# Patient Record
Sex: Male | Born: 1965 | Race: White | Hispanic: No | Marital: Married | Smoking: Never smoker
Health system: Southern US, Community
[De-identification: ages and names within clinical notes are randomized; demographics above are authoritative.]

## PROBLEM LIST (undated history)

## (undated) DIAGNOSIS — E785 Hyperlipidemia, unspecified: Secondary | ICD-10-CM

## (undated) DIAGNOSIS — I10 Essential (primary) hypertension: Secondary | ICD-10-CM

## (undated) DIAGNOSIS — T7840XA Allergy, unspecified, initial encounter: Secondary | ICD-10-CM

## (undated) DIAGNOSIS — E7800 Pure hypercholesterolemia, unspecified: Secondary | ICD-10-CM

## (undated) DIAGNOSIS — K5792 Diverticulitis of intestine, part unspecified, without perforation or abscess without bleeding: Secondary | ICD-10-CM

## (undated) DIAGNOSIS — D869 Sarcoidosis, unspecified: Secondary | ICD-10-CM

## (undated) DIAGNOSIS — I639 Cerebral infarction, unspecified: Secondary | ICD-10-CM

## (undated) HISTORY — PX: COLON SURGERY: SHX602

## (undated) HISTORY — DX: Cerebral infarction, unspecified: I63.9

## (undated) HISTORY — DX: Diverticulitis of intestine, part unspecified, without perforation or abscess without bleeding: K57.92

## (undated) HISTORY — DX: Allergy, unspecified, initial encounter: T78.40XA

## (undated) HISTORY — DX: Hyperlipidemia, unspecified: E78.5

## (undated) HISTORY — DX: Sarcoidosis, unspecified: D86.9

## (undated) HISTORY — DX: Essential (primary) hypertension: I10

---

## 1999-02-17 ENCOUNTER — Emergency Department (HOSPITAL_COMMUNITY): Admission: EM | Admit: 1999-02-17 | Discharge: 1999-02-17 | Payer: Self-pay | Admitting: Emergency Medicine

## 2002-03-26 ENCOUNTER — Encounter: Admission: RE | Admit: 2002-03-26 | Discharge: 2002-03-26 | Payer: Self-pay | Admitting: Rheumatology

## 2002-03-26 ENCOUNTER — Encounter: Payer: Self-pay | Admitting: Rheumatology

## 2006-10-10 HISTORY — PX: OTHER SURGICAL HISTORY: SHX169

## 2006-11-13 ENCOUNTER — Emergency Department (HOSPITAL_COMMUNITY): Admission: EM | Admit: 2006-11-13 | Discharge: 2006-11-13 | Payer: Self-pay | Admitting: Emergency Medicine

## 2006-11-14 ENCOUNTER — Emergency Department (HOSPITAL_COMMUNITY): Admission: EM | Admit: 2006-11-14 | Discharge: 2006-11-14 | Payer: Self-pay | Admitting: Emergency Medicine

## 2006-12-03 ENCOUNTER — Inpatient Hospital Stay (HOSPITAL_COMMUNITY): Admission: AD | Admit: 2006-12-03 | Discharge: 2006-12-07 | Payer: Self-pay | Admitting: Neurology

## 2006-12-03 ENCOUNTER — Encounter: Payer: Self-pay | Admitting: Emergency Medicine

## 2006-12-05 ENCOUNTER — Encounter (INDEPENDENT_AMBULATORY_CARE_PROVIDER_SITE_OTHER): Payer: Self-pay | Admitting: Cardiology

## 2007-02-16 ENCOUNTER — Encounter: Payer: Self-pay | Admitting: Neurology

## 2007-03-06 ENCOUNTER — Inpatient Hospital Stay (HOSPITAL_COMMUNITY): Admission: AD | Admit: 2007-03-06 | Discharge: 2007-03-07 | Payer: Self-pay | Admitting: Interventional Radiology

## 2007-03-21 ENCOUNTER — Encounter: Payer: Self-pay | Admitting: Interventional Radiology

## 2007-05-20 ENCOUNTER — Emergency Department (HOSPITAL_COMMUNITY): Admission: EM | Admit: 2007-05-20 | Discharge: 2007-05-20 | Payer: Self-pay | Admitting: Emergency Medicine

## 2007-06-15 ENCOUNTER — Ambulatory Visit (HOSPITAL_COMMUNITY): Admission: RE | Admit: 2007-06-15 | Discharge: 2007-06-15 | Payer: Self-pay | Admitting: Interventional Radiology

## 2007-09-14 ENCOUNTER — Ambulatory Visit (HOSPITAL_COMMUNITY): Admission: RE | Admit: 2007-09-14 | Discharge: 2007-09-14 | Payer: Self-pay | Admitting: Interventional Radiology

## 2008-03-14 ENCOUNTER — Ambulatory Visit (HOSPITAL_COMMUNITY): Admission: RE | Admit: 2008-03-14 | Discharge: 2008-03-14 | Payer: Self-pay | Admitting: Interventional Radiology

## 2009-01-21 ENCOUNTER — Encounter: Admission: RE | Admit: 2009-01-21 | Discharge: 2009-01-21 | Payer: Self-pay | Admitting: Internal Medicine

## 2010-10-31 ENCOUNTER — Encounter: Payer: Self-pay | Admitting: Internal Medicine

## 2011-02-22 NOTE — Discharge Summary (Signed)
[name] [name], DEPAULO NO.:  [id number]   MEDICAL RECORD NO.:  [id number]          PATIENT TYPE:  INP   LOCATION:  3112                         FACILITY:  [facility]   PHYSICIAN:  Sanjeev K. 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   HISTORY OF PRESENT ILLNESS:  This is a very pleasant 45 year old male  referred to Dr. Corliss Skains through the courtesy of Dr. Pearlean Brownie.  The  patient was admitted to [facility] on December 03, 2006 by Dr.  Lesia Sago for evaluation of vertigo, blurred vision, nausea and  headache.  The patient had experienced a head injury while at work  approximately three weeks prior to the admission.  He bumped his head on  the metal frame of a car.  An MRI was performed that showed evidence of  a mesial temporal infarct with a diminutive left vertebral artery that  was absent distally with a question of dissection.  The patient did have  a cerebral angiogram performed by Dr. Corliss Skains on December 04, 2006  that showed an approximate 6 mm saccular outpouching projecting  anteriorly from the dominant right vertebral artery just above the level  of the posterior arch of C1 associated with a focal area of stenosis  proximal to this.  The patient was also noted to have a focal area of  narrowing of approximately 50% in the proximal right posterior internal  carotid artery.  These findings were highly suspicious for a focal  dissection with a secondary pseudoaneurysm formation.  The patient was  noted to have a prominent filling defect of the left P2, of the left PCA  which was non-flow limiting.  This was felt to represent a thrombus.  He  had a nondominant left vertebral artery terminating in the ipsilateral  PICA.   The patient was seen in consultation  by Dr. Corliss Skains on Feb 16, 2007 at  which time arrangements were made to have the patient return to [facility] on Mar 06, 2007 to undergo a repeat cerebral angiogram  with possible stent placement of the right vertebral artery if felt to  be safe and indicated.   Past medical history is significant for hypertension.  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  He has a history of elevated lipids and  mild obesity.  He had a history of head trauma in February 2008.   ALLERGIES:  The patient is allergic to CODEINE.   MEDICATIONS ON ADMISSION:  Benicar, aspirin, Plavix and Lipitor.  The  Plavix was started in anticipation of the stent placement.  The patient  had previously been on Coumadin following his CVA.  This was  discontinued at some point.   SOCIAL HISTORY:  The patient is married.  He lives in [place].  He  has one daughter.  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  He does not smoke.  He drinks alcohol occasionally.   FAMILY HISTORY:  His mother is alive with hypertension.  His father is  alive with hypertension.   HOSPITAL COURSE:  As noted, this patient was admitted to [facility] on Mar 06, 2007 to undergo repeat cerebral angiogram and  possible PTA stenting of the right vertebral artery.  The stenting was  performed on the day of admission by Dr. Corliss Skains without immediate or  known complications.  Please see his dictated report for complete  details.   Following the procedure, the patient was admitted to the neuro intensive  care unit where he remained on intravenous heparin overnight.  The  patient remained stable.  He was seen the following day and was doing  well.  The right femoral groin sheath was removed after which the  patient will remain on bedrest for at least 6 hours.  If he is stable at  the end of his bedrest, he will no doubt be discharged  to home.   LABORATORY DATA:  On the day of discharge BUN was 11, creatinine 0.99,  potassium is 3.9, glucose was 93.  His GFR was greater than 60.  A CBC  on the day of discharge revealed hemoglobin 13.2, hematocrit 39.1, WBCs  10,000, platelets 294,000.   DISCHARGE MEDICATIONS:  The patient was told to continue the medications  he was taking prior to admission.  He is to remain on Plavix until  stopped by Dr. Corliss Skains.  He is to continue his regular home  medications as well as aspirin 81 mg daily.   The patient was given instructions regarding wound care.  He was told to  stay on his regular diet.  He was to follow-up with Dr. Corliss Skains in  approximately two weeks.  He was not to return to work or do anything  strenuous for at least two weeks.   PROBLEM LIST AT TIME OF DISCHARGE:  1. Status post left cerebrovascular accident.  2. History of head injury.  3. 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  4. Hypertension history.  5. Hyperlipidemia.  6. History of 2-D echocardiogram performed in February 2008 revealing      an ejection fraction of 60-70% with a question of a patent foramen      ovale.  7. History of allergy to codeine.      Delton See, P.A.    ______________________________  Grandville Silos. Corliss Skains, M.D.    DR/MEDQ  D:  03/07/2007  T:  03/07/2007  Job:  [id number]   cc:   Pramod P. Pearlean Brownie, MD  Geoffry Paradise, M.D.

## 2011-02-22 NOTE — Consult Note (Signed)
[name] [name], AYALA NO.:  [id number]   MEDICAL RECORD NO.:  [id number]           PATIENT TYPE:   LOCATION:                                 FACILITY:   PHYSICIAN:  Sanjeev K. Deveshwar, M.D.DATE OF BIRTH:  1966-04-07   DATE OF CONSULTATION:  03/21/2007  DATE OF DISCHARGE:                                 CONSULTATION   CHIEF COMPLAINT:  Status post stenting of the right vertebral artery  secondary to a right vertebral artery dissection.   HISTORY OF PRESENT ILLNESS:  This is a pleasant 45 year old male  referred to Dr. Corliss Skains through the courtesy of Dr. Pearlean Brownie, who was  admitted to [facility] on December 03, 2006 by Dr. Marlan Palau with vertigo, blurred vision, nausea, and headache.  The patient  had experienced a head injury approximately 3 weeks prior to this.  An  MRI at the time of his admission showed evidence of a left CVA.  A  cerebral angiogram was performed by Dr. Corliss Skains on December 04, 2006  that showed a 6 mm saccular outpouching projecting anteriorly from the  dominant right vertebral artery just above the level of the posterior  arch of C1, associated with a focal area of stenosis proximal to this.  This was highly suspicious of a focal dissection with a secondary  pseudoaneurysm formation.  The patient was seen in consultation by Dr.  Corliss Skains on Feb 16, 2007, and arrangements were made to have the patient  admitted to [facility] Mar 06, 2007 for treatment of the  pseudoaneurysm. During that visit, a stent was placed in the right  vertebral artery, with excellent results.  The patient returns today  accompanied by his wife and a case manager to be seen in followup.   PAST MEDICAL HISTORY:  1. Hypertension.  2. The above-noted left temporal CVA.  3. History of elevated lipids.  4. History of mild obesity.  5. History of head trauma.   ALLERGIES:  CODEINE.   MEDICATIONS:  Benicar, aspirin, Plavix, and Lipitor.   SOCIAL HISTORY:  The patient is married.  He lives in [place].  He  has one daughter.  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  He does not smoke.  He drinks alcohol occasionally.   FAMILY HISTORY:  His mother is alive and has hypertension.  His father  is alive and has hypertension.   IMPRESSION AND PLAN:  As noted, the patient returns today to be seen in  follow-up after a stent was placed in his right vertebral artery for a  dissection associated with a pseudoaneurysm on Mar 06, 2007 by Dr.  Corliss Skains. The patient had suffered a left CVA, with minimal residual  deficits.  Today, he reports that he has had some occasional mild  headaches which he attributes to the heat but has otherwise been  asymptomatic.  The patient's wife is concerned about some memory  problems he has been experiencing.  Dr. Corliss Skains feels that this may be  secondary to his stroke.   The patient continues  on aspirin and Plavix.  He has had some problems  with bruising due to the Plavix.  Dr. Corliss Skains recommended stopping the  Plavix at this time, which was the original plan.  He recommended  continuing aspirin 81 mg once daily for four more weeks.  Then, he is to  switch to 81 mg every other day.   The patient was told that he can return to work on April 10, 2007.  He was  not to lift more than 50 pounds.  He was to avoid any sudden movements  of his neck.   A repeat cerebral angiogram was recommended in 3 months.  If the  pseudoaneurysm appears to be healed at this time, no further followup  will be recommended.  The patient does not smoke. However, his wife  smokes.  The patient was advised to avoid any secondhand smoke.   Dr. Corliss Skains reviewed the results of the angiogram and pointed out the  area that was stented.  All of their questions were answered.  Greater  than 30 minutes was spent on this consult.      Delton See, P.A.    ______________________________   Grandville Silos. Corliss Skains, M.D.    DR/MEDQ  D:  03/21/2007  T:  03/22/2007  Job:  [id number]   cc:   Delia Heady, M.D.  Geoffry Paradise, M.D.

## 2011-02-22 NOTE — Consult Note (Signed)
[name], [name] NO.:  [id number]   MEDICAL RECORD NO.:  [id number]          PATIENT TYPE:  OUT   LOCATION:  MRI                          FACILITY:  [facility]   PHYSICIAN:  Sanjeev K. Deveshwar, M.D.DATE OF BIRTH:  Feb 25, 1966   DATE OF CONSULTATION:  09/14/2007  DATE OF DISCHARGE:                                 CONSULTATION   DATE OF CONSULTATION:  September 14, 2007.   CHIEF COMPLAINT:  Status post stenting of the right vertebral artery  secondary to a right vertebral artery dissection performed on Mar 06, 2007.   BRIEF HISTORY:  This is a pleasant 45 year old male referred to Dr.  Corliss Skains through the courtesy of Dr. Pearlean Brownie.  The patient was admitted  to [facility] December 03, 2006 by Dr. Lesia Sago with a left  CVA.  The patient had experienced a head injury at work approximately 3  weeks prior to his admission.  A cerebral angiogram was performed by Dr.  Corliss Skains on December 04, 2006 that showed a 6 mm saccular outpouching  projecting anteriorly from the dominant right vertebral artery just  above the level of the posterior arch of C1 associated with a focal area  of stenosis proximal to this.  This was consistent with a focal  dissection with a secondary pseudoaneurysm formation.  On Mar 06, 2007,  a stent was placed in the right vertebral artery by Dr. Corliss Skains with  excellent results.  The patient returns today for a follow up MRI and a  followup visit.   PAST MEDICAL HISTORY:  1. Significant for hypertension.  2. The above noted left temporal CVA.  3. He has a history of elevated lipids.  4. History of mild obesity.  5. History of head trauma prior to his CVA.  6. Also of note, the patient was hit in the head by a softball in      August of this year.  He was seen at [facility] and a CT scan was negative   ALLERGIES:  CODEINE.   CURRENT MEDICATIONS:  1. Include a blood pressure medication.  2.  Simvastatin.  3. Aspirin.   SOCIAL HISTORY:  The patient is married.  He lives in [place].  He  has a 29 year old daughter.  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  He does not smoke.  He drinks alcohol occasionally.   FAMILY HISTORY:  His parents are alive.  They both have hypertension.   IMPRESSION/PLAN:  As noted, Dr. Corliss Skains met with the patient, his wife  and daughter today for a followup visit.  The patient had an MRI just  prior to this visit.  The right vertebral artery stent appeared to be  patent on the MRA.  However unfortunately, the area of pseudoaneurysm  was not included in the study.  Dr. Corliss Skains contacted the MRI  department and we will have a repeat study performed today at no charge  to this patient in order to view the pseudoaneurysm in the area of the  PICA.   He reported that he had occasional headaches.  He also gets a sharp  shooting sensation which he describes as a twinge in the back of his  head when he turns his neck in a certain position.  The patient's wife  and daughter note that he seems to be forgetful, occasionally distracted  and occasionally agitated.  The patient also notes that he gained  approximately 20 pounds while being out of work following his stroke and  stenting procedure.  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  He was given a 50 pound weight lifting restriction.   Dr. Corliss Skains recommended the patient stay on aspirin indefinitely.  He  recommended that the aspirin be increased from 81 mg to 325 mg.  He will  probably need to have a repeat MRI in approximately 6 months.  Further  recommendations will be made after the results of today's MRI/MRA are  known.   Greater than 30 minutes were spent on this consult.      Delton See, P.A.    ______________________________  Grandville Silos. Corliss Skains, M.D.    DR/MEDQ  D:  09/14/2007  T:  09/15/2007  Job:  [id number]   cc:   Geoffry Paradise, M.D.  Pramod P. Pearlean Brownie, MD

## 2011-02-22 NOTE — H&P (Signed)
[name] [name], [name] [name]              ACCOUNT NO.:  [id number]   MEDICAL RECORD NO.:  [id number]          PATIENT TYPE:  AMB   LOCATION:  SDS                          FACILITY:  [facility]   PHYSICIAN:  Sanjeev K. Deveshwar, M.D.DATE OF BIRTH:  [birth year]   DATE OF ADMISSION:  03/06/2007  DATE OF DISCHARGE:                              HISTORY & PHYSICAL   CHIEF COMPLAINT:  Right vertebral artery dissection.   HISTORY OF PRESENT ILLNESS:  This is a 45 year old male referred to Dr.  Corliss Skains through the courtesy of Dr. Pearlean Brownie.  The patient was admitted  to [facility] on 12/03/06 by Dr. Lesia Sago for evaluation of  vertigo, blurred vision, nausea and headache.  The patient had  experienced a head injury at work approximately three weeks prior to  this admission when he bumped his head on the metal frame of a car.  An  MRI during that admission showed evidence of a mesiotemporal infarct  with a diminutive left vertebral artery that was absent distally with a  question of a dissection.  The patient did have a cerebral angiogram  performed by Dr. Corliss Skains on February 25.  This showed an approximately  6 mm saccular out pouching projecting anteriorly from the dominant right  vertebral artery just above the level of the posterior arch of C1  associated with a focal area of stenosis proximal to this.  The patient  was also noted to have a focal area of narrowing of the large 50% area  of proximal to the right PICA.  These findings were highly suspicious  for a focal dissection with a secondary pseudoaneurysm formation.  The  patient also was noted to have a prominent filling defect of the left  P2, of the left PCA which was non-flow limiting.  This was felt to  represent a thrombus.  He had a non-dominant left vertebral artery  terminating in the ipsilateral PICA.  The patient was seen in  consultation by Dr. Corliss Skains on Feb 16, 2007 at which time arrangements  were made to have the  patient return today, Mar 06, 2007 to undergo a  repeat cerebral angiogram with possible stent placement if felt to be  safe and indicated.   PAST MEDICAL HISTORY:  The patient has a history of hypertension.  He  had the above noted left temporal cerebrovascular accident with possible  left vertebral artery dissection in February of 2008.  He has a history  of elevated lipids and mild obesity.   ALLERGIES:  The patient is allergic to CODEINE.   MEDICATIONS:  Medications at the time of admission included Benicar,  aspirin, Plavix and Lipitor.  The Plavix had been started three days  prior to admission in anticipation of a possible stent placement.  The  patient had previously been on Coumadin following his cerebrovascular  accident.   SOCIAL HISTORY:  The patient is married.  He lives in [place].  He  has one daughter.  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  He does not smoke.  He drinks alcohol occasionally.  FAMILY HISTORY:  His mother is alive with hypertension.  His father is  alive with hypertension.   LABORATORY DATA:  An INR is 1.0.  PTT is 28.  CBC reveals hemoglobin  14.9, hematocrit 43.1, white blood cell count is 8.5, platelets 380,000.  BUN is 13, creatinine 1.13.  Potassium is 4.5.  Glucose 77.  GFR was  greater than 60.   REVIEW OF SYSTEMS:  Completely negative.  The patient states that all of  his symptoms associated with his CVA have resolved.  He never did have  any extremity weakness.   PHYSICAL EXAMINATION:  GENERAL:  Reveals a very pleasant, cooperative,  45 year old white male in no acute distress.  VITAL SIGNS:  Blood pressure 100/66, pulse 62, respirations 16,  temperature 97.6.  HEENT:  Within normal limits.  NECK:  No bruits.  HEART:  Regular rate and rhythm with somewhat distant heart sounds.  LUNGS:  Clear.  ABDOMEN:  Soft, nontender.  EXTREMITIES:  Pulses intact without edema.  His air way was rated at a  1.  His ASA  scale was rated at a 2.  NEUROLOGIC:  Mental status:  The patient was alert and oriented and  follows commands.  Cranial nerves II-XII grossly intact.  Sensation was  intact to light touch.  Motor strength was 5/5 throughout.  Cerebellar  testing was intact.   IMPRESSION:  1. History of a head injury associated with a left temporal CVA and      possible left vertebral artery dissection.  2. Hypertension.  3. Hyperlipidemia.  4. 2D echo performed in February revealing ejection fraction 60 to 70%      with question of a PFO.   ALLERGIES:  CODEINE.   As noted, the patient will undergo a repeat cerebral angiogram today to  be performed by Dr. Corliss Skains with possible stent placement of the  vertebral artery if felt to be safe and indicated.  As noted, the  patient has been on Plavix in anticipation of the stent placement.      Delton See, P.A.    ______________________________  Grandville Silos. Corliss Skains, M.D.    DR/MEDQ  D:  03/06/2007  T:  03/06/2007  Job:  [id number]   cc:   Pramod P. Pearlean Brownie, MD  Geoffry Paradise, M.D.

## 2011-02-25 NOTE — Discharge Summary (Signed)
NAMEMEKO, MASTERSON NO.:  [id number]   MEDICAL RECORD NO.:  [id number]          PATIENT TYPE:  INP   LOCATION:  3112                         FACILITY:  [facility]   PHYSICIAN:  Delton See, P.A.   DATE OF BIRTH:  [birth year]   DATE OF ADMISSION:  03/06/2007  DATE OF DISCHARGE:  03/07/2007                               DISCHARGE SUMMARY   BRIEF ADDENDUM:  This is a continuation of the problem list at the time  of discharge.   1. Status post stent placement in the right vertebral artery,      performed 03/06/07 by Dr. Corliss Skains under general anesthesia      without immediate or known complications.      Delton See, P.A.     DR/MEDQ  D:  03/09/2007  T:  03/09/2007  Job:  [id number]   cc:   Delton See, P.A.  Sanjeev K. Corliss Skains, M.D.  Pramod P. Pearlean Brownie, MD  Geoffry Paradise, M.D.

## 2011-02-25 NOTE — Discharge Summary (Signed)
NAMESHAMAL, STRACENER              ACCOUNT NO.:  [id number]   MEDICAL RECORD NO.:  [id number]          PATIENT TYPE:  INP   LOCATION:  3041                         FACILITY:  [facility]   PHYSICIAN:  Pramod P. Pearlean Brownie, MD    DATE OF BIRTH:  07-08-1966   DATE OF ADMISSION:  12/03/2006  DATE OF DISCHARGE:  12/07/2006                               DISCHARGE SUMMARY   DIAGNOSES AT TIME OF DISCHARGE:  1. 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  2. 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  3. Headache secondary to dissection.  4. Hypercholesterolemia.  5. Hypertension.  6. Mild obesity.   MEDICAL DECISION MAKING:  1. Coumadin 5 mg a day.  2. Zocor 40 mg a day (or may take prescription if different prescribed      by Dr. Jacky Kindle just prior to admission).  3. Benicar 20 mg a day.  4. Depakote ER 500 mg a day.   STUDIES PERFORMED:  1. CT of the brain on admission shows a subcentimeter low density in      the low right parietal __________  that could be a small stroke      that has evolved since prior exam.  Question hyperdensity      associated with right vertebral artery.  2. MRI of the brain shows a 1-cm stroke in the posterior medial      temporal lobe on the left, looks like a localize infection of the      right vertebral artery at the foramen magnum involving the medial      wall.  There was probably an embolus that came from this site and      traveled up to the __________ .  The left posterior cerebral artery      has etiology of the patient stroke.  3. MRA of the head shows short segment dissection along the medial      wall of the right vertebral artery at the foramen magnum with      suspicion of a small embolus to the left PCA.  4. MRA of the neck shows localized infection of the right vertebral      artery and the foramen magnum.  There does not appear to be      __________  narrowing of the lumen.  5. Cerebral angiogram shows a 6-mm secular __________  projecting      anteriorly from the dominant right vertebral artery to the level of      the posterior arch of P1 associated with a couple areas of stenosis      proximal to this.  Also noted is a large focal area of narrowing,      50% area proximal to the right pica.  These findings are suspicious      of the focal dissection with a secondary  __________  prominent      filling defect of the left P2 of the left PCA has __________ .      Nondominant left vertebral artery  terminated from the ipsilateral      and pica.  6. EKG shows sinus bradycardia.  7. Transcranial Doppler performed.  Results pending.  8. 2D echocardiogram shows EF of 60% to 70% with no left ventricular      regional wall motion abnormalities.  Patent foremen ovale cannot be      excluded.  No discrete embolic source.  9. Carotid Doppler not performed.   LABORATORY STUDIES:  INR on day of discharge 1.9.  CBC normal.  Chemistry normal.  Liver function tests normal.  Albumin 3.9.  Cardiac  enzymes negative.  Cholesterol 222, HDL 43, LDL 163 and triglycerides  78.  Urinalysis was negative.  Homocysteine was 11.5.  Alcohol level  less than 5.  Urine drug screen positive for opiates and hemoglobin A1c  5.8.   HISTORY OF PRESENT ILLNESS:  [name] [name] is a 45 year old right-  handed white male with a history of hypertension.  He presented to  [facility] Emergency Room for evaluation of a headache.  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  The patient was under a car, bumped his  head on the middle frame of a car and then leaned back and bumped the  back of his head on the tire.  The patient noticed onset of vertigo  about 4 days later that lasted 3 to 4 days associated with blurred  vision and nausea.  The nausea has resolved.  Two days prior to this  admission the patient began  developing a left temporal headache.  This  has been persistent.  The patient became concerned as he does not  generally have headache.  The patient underwent a MRI scan of the brain  that revealed evidence of a mesiotemporal infarct with a vertebral  artery that is absent distally and question whether dissection was  entertained.  The right vertebral artery is patent.  The rest of the  carotid circulation is unremarkable.  Neurology was asked to evaluate.  He was admitted to the hospital for further circ evaluation.  He was not  a TPA candidate secondary to time.   HOSPITAL COURSE:  1. Cerebral angiogram and MRI both confirmed a right vertebral artery      infection with a proximal 5.5- to 6-mm pseudoaneurysm with a      proximal filling defect of the PCA at the PT segment consistent      with thrombus.  His left vertebral artery is in his left pica.  He      was started on IV heparin for secondary stroke prevention, was      transitioned with Coumadin.  The day of discharge his INR was 1.9,      and he will be followed up by Dr. Geoffry Paradise at their Coumadin      clinic.  The patient was started on Depakote for severe headache      secondary to the infection.  Will continue this for now.  2. Cholesterol was elevated.  He was placed on a Statin.  Patient      apparently had been given a Statin prior to admission that he had      filled, but had not started taking.  Either medicine prescribed      here or by Dr. Jacky Kindle, if they are different are acceptable.      Patient understands this and will begin taking it  at discharge.  3. Patient and family given assistance education about strokes, stroke      risk factors, and plan of care, as well as Coumadin.  Stable for      discharge home.   Discharge __________  normal.   DISCHARGE PLAN:  1. Discharge home with __________  2. Candidate for secondary stroke prevention. 3. Follow up with Dr. Jacky Kindle for evaluation.  Will call Dr.  Jacky Kindle      office if he has not heard from them by tomorrow.  4. New statin.  Follow up with Dr. Jacky Kindle related to this.  5. Depakote for headache will be temporary.  6. Plan is for Coumadin x3 months and then reevaluate angiogram to      determine plan of care at that time.  7. Patient has been advised no heavy lifting greater than 3 pounds.      No strenuous activity.  8. Follow up with Dr. Pearlean Brownie in 2 to 3 months.      Annie Main, N.P.    ______________________________  Sunny Schlein. Pearlean Brownie, MD    SB/MEDQ  D:  12/07/2006  T:  12/08/2006  Job:  [id number]   cc:   Pramod P. Pearlean Brownie, MD  Geoffry Paradise, M.D.

## 2011-02-25 NOTE — H&P (Signed)
NAME:  HEVER, CASTILLEJA NO.:  [id number]   MEDICAL RECORD NO.:  [id number]          PATIENT TYPE:  INP   LOCATION:  3041                         FACILITY:  [facility]   PHYSICIAN:  Marlan Palau, M.D.  DATE OF BIRTH:  Oct 01, 1966   DATE OF ADMISSION:  12/03/2006  DATE OF DISCHARGE:                              HISTORY & PHYSICAL   NEUROLOGIC ADMISSION NOTE:   HISTORY OF PRESENT ILLNESS:  [name] [name] is a 45 year old right-  handed white male born Jul 12, 1966 with a history of  hypertension.  This patient comes to the [facility] for  evaluation of a headache.  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  The patient was under a car, bumped his head on the metal  frame of the car, and then went back and bumped the back of his head on  the tire.  The patient noted onset of some vertigo about 4 days later  that lasted 3-4 days associated with blurred vision and some nausea.  The patient has had resolution of the nausea, however.  About 2 days  prior to this admission, the patient began developing a left temporal  headache.  This has been persistent.  The patient became concerned, as  he just generally does not have headaches.  The patient underwent an MRI  scan of the brain that revealed evidence of a mesiotemporal infarction  with diminutive left vertebral artery that is absent distally and  question of a dissection was entertained.  Right vertebral artery is  patent.  The rest of the carotid circulation and intracranial  circulation is unremarkable.  Neurology was asked to see the patient for  further evaluation.   PAST MEDICAL HISTORY:  1. New onset left temporal stroke, possible left vertebral artery      dissection.  2. Hypertension.   MEDICATIONS:  Benicar 20 mg daily.   ALLERGIES:  CODEINE.   SOCIAL HISTORY:  This patient lives in the [place]  area.  He is married and has 1 daughter who is alive and well.  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  The  patient does not smoke.  Drinks alcohol on occasion.   PRIMARY MEDICAL PHYSICIAN:  Dr. Jacky Kindle.   FAMILY MEDICAL HISTORY:  Mother is alive with hypertension.  Father is  alive with hypertension.  There is diabetes on the mother's side of the  family. Maternal grandmother has heart disease.  The patient has one  sister who is alive and well.   REVIEW OF SYSTEMS:  Notable for no recent fevers or chills.  The patient  does note the left temporal headache.  Did note some right facial  numbness associated with the vertigo about two and half weeks ago.  This  has resolved.  Otherwise, no numbness or weakness on the face, arms or  legs is noted.  The patient denies any gait disturbance at this point.  He has no problems controlling bowels  or bladder.  No abdominal pain,  chest pain, palpitations of the heart or blackout episodes.   PHYSICAL EXAMINATION:  VITALS:  Blood pressure is 130/90, heart rate 79,  respiratory rate 20, temperature afebrile.  GENERAL:  This patient is a minimally obese white male who is alert and  cooperative at the time examination.  HEENT:  Head is atraumatic.  Eyes - pupils are equal, round and reactive  to light.  Disks are flat bilaterally.  NECK:  Supple.  No carotid bruits are noted.  RESPIRATORY:  Clear.  CARDIOVASCULAR:  Regular rate and rhythm.  No obvious murmurs or rubs  noted.  EXTREMITIES:  Without significant edema.  ABDOMEN:  Positive bowel sounds.  No organomegaly or tenderness noted.  NEUROLOGIC:  Cranial nerves as above.  Facial symmetry is present.  The  patient has good sensation of the face to pinprick and soft touch  bilaterally.  Has good strength of the facial muscle, muscles of head  turning and shoulder shrug bilaterally.  Speech is well enunciated and  not aphasic.  Motor testing shows 5/5 strength in  all fours.  Good  symmetric motor tone is noted throughout.  Sensory testing is intact to  pinprick, soft touch and vibratory sensation throughout.  The patient  has good finger-nose-finger and heel-to-shin.  Gait was not tested.  Deep tendon reflexes symmetric and normal.  Toes were neutral  bilaterally.  No drift was seen in the upper or lower extremities.   NIH stroke scale was zero.   IMPRESSION:  1. Left mesiotemporal stroke by MRI study.  Questionable left      vertebral artery dissection.  2. History of hypertension.   This patient has essentially no deficit at this point.  The patient is  not a TPA candidate due to the duration of deficit and low NIH stroke  scale score.  The patient will be transferred to [facility] for  more thorough stroke evaluation.   PLAN:  1. Admission to [facility].  2. Aspirin therapy.  3. IV fluid hydration.  4. A 2-D echocardiogram.  5. Transcranial Doppler study with bubble study.  6. Will follow patient's clinical course while in-house.      Marlan Palau, M.D.  Electronically Signed     CKW/MEDQ  D:  12/03/2006  T:  12/03/2006  Job:  [id number]   cc:   Geoffry Paradise, M.D.  [facility]

## 2011-07-07 LAB — CREATININE, SERUM
Creatinine, Ser: 1.06
GFR calc Af Amer: >60

## 2011-07-18 LAB — CREATININE, SERUM
Creatinine, Ser: 1.16
GFR calc Af Amer: >60

## 2011-07-18 LAB — BUN: BUN: 13

## 2011-07-22 LAB — BASIC METABOLIC PANEL
BUN: 13
CO2: 25
Calcium: 9.3
Creatinine, Ser: 1.16
GFR calc non Af Amer: >60
Glucose, Bld: 102 — ABNORMAL HIGH
Sodium: 138

## 2011-07-22 LAB — CBC
HCT: 44.3
MCHC: 34.2
MCV: 92.7
Platelets: 340
WBC: 7

## 2011-07-22 LAB — DIFFERENTIAL
Eosinophils Absolute: 0.3
Eosinophils Relative: 5
Lymphs Abs: 1.4

## 2011-07-22 LAB — PROTIME-INR: Prothrombin Time: 13.5

## 2011-09-12 ENCOUNTER — Encounter: Payer: Self-pay | Admitting: Internal Medicine

## 2011-09-14 ENCOUNTER — Ambulatory Visit: Payer: Self-pay | Admitting: Gastroenterology

## 2011-10-12 ENCOUNTER — Ambulatory Visit (INDEPENDENT_AMBULATORY_CARE_PROVIDER_SITE_OTHER): Payer: BC Managed Care – PPO | Admitting: Internal Medicine

## 2011-10-12 ENCOUNTER — Encounter: Payer: Self-pay | Admitting: Internal Medicine

## 2011-10-12 DIAGNOSIS — R1084 Generalized abdominal pain: Secondary | ICD-10-CM

## 2011-10-12 DIAGNOSIS — K5732 Diverticulitis of large intestine without perforation or abscess without bleeding: Secondary | ICD-10-CM

## 2011-10-12 DIAGNOSIS — R933 Abnormal findings on diagnostic imaging of other parts of digestive tract: Secondary | ICD-10-CM

## 2011-10-12 MED ORDER — PEG-KCL-NACL-NASULF-NA ASC-C 100 G PO SOLR: 1.0000 | Freq: Once | ORAL | Status: DC

## 2011-10-12 NOTE — Patient Instructions (Addendum)
You have been scheduled for a colonoscopy. Please follow written instructions given to you at your visit today.  Please pick up your prep kit at the pharmacy within the next 2-3 days.  We have sent the following medications to your pharmacy for you to pick up at your convenience: Moviprep take as directed

## 2011-10-12 NOTE — Progress Notes (Signed)
HISTORY OF PRESENT ILLNESS:  [name] [name] is a 46 y.o. male with hypertension, hyperlipidemia, sarcoidosis, and traumatic CVA requiring vertebral artery stenting. He is sent today through the courtesy of Dr. Jacky Kindle regarding recurrent abdominal pain, likely diverticulitis. The patient reports to me 2-3 bouts of moderately severe to severe lower abdominal pain, over the past 2 years, which has been diagnosed as diverticulitis. Each episode has been treated with antibiotics with subsequent resolution of pain within one week. Had been complaining of some pain in late August. Outpatient CT scan of the abdomen and pelvis performed 06/17/2011 revealed sigmoid diverticulosis without acute diverticulitis. At that time, he was actually feeling better. His most recent episode was in late November or early December for which she was again treated with ciprofloxacin and metronidazole. Symptoms resolve. Currently asymptomatic. His GI review of systems is otherwise negative. Specifically, no change in bowel habits, constipation, rectal bleeding, or weight loss. There is a family history of colon polyps, but not colon cancer.  REVIEW OF SYSTEMS:  All non-GI ROS reported as negative.  Past Medical History  Diagnosis Date  . Hypertension   . Sarcoidosis   . Hyperlipemia   . Depression   . Diverticulitis   . CVA (cerebral vascular accident)     Past Surgical History  Procedure Date  . Vertebral artery stenting 2008    Social History [name] [name]  reports that he has never smoked. He has never used smokeless tobacco. He reports that he drinks alcohol. He reports that he does not use illicit drugs.  family history includes Allergies in his mother; Emphysema in his paternal grandfather; Heart disease in his paternal grandfather; Hypertension in his father and mother; and Skin cancer in his father.  There is no history of Colon cancer.  Allergies  Allergen Reactions  . Celebrex (Celecoxib)   .  Codeine   . Sulfa Antibiotics        PHYSICAL EXAMINATION: Vital signs: BP 110/76  Pulse 74  Ht 6' (1.829 m)  Wt 232 lb 3.2 oz (105.325 kg)  BMI 31.49 kg/m2  Constitutional: generally well-appearing, no acute distress Psychiatric: alert and oriented x3, cooperative Eyes: extraocular movements intact, anicteric, conjunctiva pink Mouth: oral pharynx moist, no lesions Neck: supple no lymphadenopathy Cardiovascular: heart regular rate and rhythm, no murmur Lungs: clear to auscultation bilaterally Abdomen: soft,obese, mild tenderness left lower quadrant to deep palpation, nondistended, no obvious ascites, no peritoneal signs, normal bowel sounds, no organomegaly Rectal:deferred until colonoscopy Extremities: no lower extremity edema bilaterally Skin: no lesions on visible extremities Neuro: No focal deficits.   ASSESSMENT:  #1. Recurrent episodes of acute lower abdominal pain, most consistent with diverticulitis. Currently asymptomatic #2. Abnormal CT scan of the abdomen and pelvis revealing diverticulosis without diverticulitis September 2012 #3. Family history of colon polyps #4. General medical problems   PLAN:  #1. Schedule colonoscopy to rule out other entities masquerading as recurrent diverticulitis.The nature of the procedure, as well as the risks, benefits, and alternatives were carefully and thoroughly reviewed with the patient. Ample time for discussion and questions allowed. The patient understood, was satisfied, and agreed to proceed. Movi prep prescribed. The patient instructed on its use. #2. Educational discussion today regarding the pathophysiology, diagnosis, treatment, and outcomes of diverticulitis. Supplemental literature provided. #3. High-fiber diet

## 2011-10-20 ENCOUNTER — Ambulatory Visit (AMBULATORY_SURGERY_CENTER): Payer: BC Managed Care – PPO | Admitting: Internal Medicine

## 2011-10-20 ENCOUNTER — Encounter: Payer: Self-pay | Admitting: Internal Medicine

## 2011-10-20 VITALS — BP 113/65 | HR 67 | Temp 97.5°F | Resp 20 | Ht 72.0 in | Wt 232.0 lb

## 2011-10-20 DIAGNOSIS — K5732 Diverticulitis of large intestine without perforation or abscess without bleeding: Secondary | ICD-10-CM

## 2011-10-20 DIAGNOSIS — K5730 Diverticulosis of large intestine without perforation or abscess without bleeding: Secondary | ICD-10-CM

## 2011-10-20 DIAGNOSIS — R933 Abnormal findings on diagnostic imaging of other parts of digestive tract: Secondary | ICD-10-CM

## 2011-10-20 MED ORDER — SODIUM CHLORIDE 0.9 % IV SOLN: 500.0000 mL | INTRAVENOUS | Status: DC

## 2011-10-20 NOTE — Patient Instructions (Signed)
Discharge instructions given with verbal understanding. Handouts on diverticulosis and a high fiber diet given. Resume previous medications. 

## 2011-10-20 NOTE — Progress Notes (Signed)
Patient did not experience any of the following events: a burn prior to discharge; a fall within the facility; wrong site/side/patient/procedure/implant event; or a hospital transfer or hospital admission upon discharge from the facility. (G8907) Patient did not have preoperative order for IV antibiotic SSI prophylaxis. (G8918)  

## 2011-10-20 NOTE — Op Note (Signed)
[facility] [address] [place], [place]  [address]  COLONOSCOPY PROCEDURE REPORT  PATIENT:  [name] [name], [name] [name]  MR#:  [id number] BIRTHDATE:  06/01/1966, 45 yrs. old  GENDER:  male ENDOSCOPIST:  Wilhemina Bonito. Eda Keys, MD REF. BY:  Geoffry Paradise, M.D. PROCEDURE DATE:  10/20/2011 PROCEDURE:  Diagnostic Colonoscopy ASA CLASS:  Class II INDICATIONS:  Abnormal CT of abdomen, Abdominal pain ; RECURRENT DIVERTICULITIS (CLINICALLY) MEDICATIONS:   MAC sedation, administered by CRNA, propofol (Diprivan) 200 mg IV  DESCRIPTION OF PROCEDURE:   After the risks benefits and alternatives of the procedure were thoroughly explained, informed consent was obtained.  Digital rectal exam was performed and revealed no abnormalities.   The [id number] endoscope was introduced through the anus and advanced to the cecum, which was identified by both the appendix and ileocecal valve, without limitations.  The quality of the prep was excellent, using MoviPrep.  The instrument was then slowly withdrawn as the colon was fully examined. <<PROCEDUREIMAGES>>  FINDINGS:  Moderate diverticulosis was found transverse to sigmoid Otherwise normal colonoscopy without  polyps, masses, vascular ectasias, or inflammatory changes.   Retroflexed views in the rectum revealed no abnormalities.    The time to cecum = 1:45 minutes. The scope was then withdrawn in 10:32  minutes from the cecum and the procedure completed.  COMPLICATIONS:  None  ENDOSCOPIC IMPRESSION: 1) Moderate diverticulosis in the transverse to sigmoid 2) Otherwise normal colonoscopy  RECOMMENDATIONS: 1) High fiber diet. 2) Continue current colorectal screening recommendations for "routine risk" patients with a repeat colonoscopy age 39.  ______________________________ Wilhemina Bonito. Eda Keys, MD  CC:  Geoffry Paradise, MD;  The Patient  n. eSIGNED:   Wilhemina Bonito. Eda Keys at 10/20/2011 10:29 AM  Birdie Riddle, [id number]

## 2011-10-21 ENCOUNTER — Telehealth: Payer: Self-pay | Admitting: *Deleted

## 2011-10-21 NOTE — Telephone Encounter (Signed)

## 2011-12-01 ENCOUNTER — Telehealth: Payer: Self-pay | Admitting: Internal Medicine

## 2011-12-01 MED ORDER — HYOSCYAMINE SULFATE 0.125 MG SL SUBL: SUBLINGUAL_TABLET | SUBLINGUAL | Status: DC

## 2011-12-01 NOTE — Telephone Encounter (Signed)
Gatorade to hydrate, levsin sl .25mg , 1-2 P.O.  q 4 hours prn cramping, and prilosec OTC 20 mg daily for GERD. Office follow up in 2-3 weeks. Call in interim if not doing better by the first of next week

## 2011-12-01 NOTE — Telephone Encounter (Signed)
Pt thinks he had a stomach virus that started Tuesday. He has had diarrhea and vomiting along with abdominal cramping and pain. He has not thrown up in a while but still continues with the cramping, pain, and diarrhea. He is also c/o bad acid reflux. He has taken otc zantac 75mg  but states it is not helping. Dr. Marina Goodell please advise.

## 2011-12-01 NOTE — Telephone Encounter (Signed)
Dr. Marina Goodell did you want levsin sl .125mg  1-2 PO q 4 hours prn cramping? Please advise.

## 2011-12-01 NOTE — Telephone Encounter (Signed)
Rx sent to the pharmacy.

## 2011-12-28 ENCOUNTER — Ambulatory Visit: Payer: BC Managed Care – PPO | Admitting: Internal Medicine

## 2012-02-03 ENCOUNTER — Emergency Department (HOSPITAL_COMMUNITY): Payer: BC Managed Care – PPO

## 2012-02-03 ENCOUNTER — Encounter (HOSPITAL_COMMUNITY): Payer: Self-pay

## 2012-02-03 ENCOUNTER — Emergency Department (HOSPITAL_COMMUNITY)
Admission: EM | Admit: 2012-02-03 | Discharge: 2012-02-03 | Disposition: A | Discharge status: Home / Self Care | Payer: BC Managed Care – PPO | Attending: Emergency Medicine | Admitting: Emergency Medicine

## 2012-02-03 DIAGNOSIS — K5792 Diverticulitis of intestine, part unspecified, without perforation or abscess without bleeding: Secondary | ICD-10-CM

## 2012-02-03 DIAGNOSIS — E785 Hyperlipidemia, unspecified: Secondary | ICD-10-CM | POA: Insufficient documentation

## 2012-02-03 DIAGNOSIS — R110 Nausea: Secondary | ICD-10-CM | POA: Insufficient documentation

## 2012-02-03 DIAGNOSIS — F329 Major depressive disorder, single episode, unspecified: Secondary | ICD-10-CM | POA: Insufficient documentation

## 2012-02-03 DIAGNOSIS — F3289 Other specified depressive episodes: Secondary | ICD-10-CM | POA: Insufficient documentation

## 2012-02-03 DIAGNOSIS — Z79899 Other long term (current) drug therapy: Secondary | ICD-10-CM | POA: Insufficient documentation

## 2012-02-03 DIAGNOSIS — R109 Unspecified abdominal pain: Secondary | ICD-10-CM | POA: Insufficient documentation

## 2012-02-03 DIAGNOSIS — Z8673 Personal history of transient ischemic attack (TIA), and cerebral infarction without residual deficits: Secondary | ICD-10-CM | POA: Insufficient documentation

## 2012-02-03 DIAGNOSIS — I10 Essential (primary) hypertension: Secondary | ICD-10-CM | POA: Insufficient documentation

## 2012-02-03 DIAGNOSIS — K5732 Diverticulitis of large intestine without perforation or abscess without bleeding: Secondary | ICD-10-CM | POA: Insufficient documentation

## 2012-02-03 LAB — COMPREHENSIVE METABOLIC PANEL WITH GFR
ALT: 30 U/L (ref 0–53)
AST: 23 U/L (ref 0–37)
Albumin: 4.3 g/dL (ref 3.5–5.2)
Alkaline Phosphatase: 78 U/L (ref 39–117)
BUN: 13 mg/dL (ref 6–23)
CO2: 27 meq/L (ref 19–32)
Calcium: 9.6 mg/dL (ref 8.4–10.5)
Chloride: 101 meq/L (ref 96–112)
Creatinine, Ser: 1.08 mg/dL (ref 0.50–1.35)
GFR calc Af Amer: >90 mL/min
GFR calc non Af Amer: 82 mL/min — ABNORMAL LOW
Glucose, Bld: 96 mg/dL (ref 70–99)
Potassium: 3.8 meq/L (ref 3.5–5.1)
Sodium: 138 meq/L (ref 135–145)
Total Bilirubin: 1.1 mg/dL (ref 0.3–1.2)
Total Protein: 7.9 g/dL (ref 6.0–8.3)

## 2012-02-03 LAB — DIFFERENTIAL
Eosinophils Absolute: 0.4 10*3/uL (ref 0.0–0.7)
Eosinophils Relative: 3 % (ref 0–5)
Lymphs Abs: 1.5 10*3/uL (ref 0.7–4.0)
Monocytes Relative: 10 % (ref 3–12)

## 2012-02-03 LAB — CBC
Hemoglobin: 15.6 g/dL (ref 13.0–17.0)
MCH: 31.3 pg (ref 26.0–34.0)
MCV: 90.2 fL (ref 78.0–100.0)
Platelets: 355 10*3/uL (ref 150–400)
RBC: 4.98 MIL/uL (ref 4.22–5.81)

## 2012-02-03 LAB — LIPASE, BLOOD: Lipase: 23 U/L (ref 11–59)

## 2012-02-03 LAB — URINALYSIS, ROUTINE W REFLEX MICROSCOPIC
Glucose, UA: NEGATIVE mg/dL
Hgb urine dipstick: NEGATIVE
Ketones, ur: NEGATIVE mg/dL
Leukocytes, UA: NEGATIVE
Protein, ur: NEGATIVE mg/dL
pH: 6.5 (ref 5.0–8.0)

## 2012-02-03 MED ORDER — CIPROFLOXACIN IN D5W 400 MG/200ML IV SOLN
400.0000 mg | Freq: Once | INTRAVENOUS | Status: AC
  Administered 2012-02-03: 400 mg via INTRAVENOUS
  Filled 2012-02-03: qty 200

## 2012-02-03 MED ORDER — OXYCODONE-ACETAMINOPHEN 5-325 MG PO TABS: 1.0000 | ORAL_TABLET | Freq: Four times a day (QID) | ORAL | Status: AC | PRN

## 2012-02-03 MED ORDER — METRONIDAZOLE 500 MG PO TABS: 500.0000 mg | ORAL_TABLET | Freq: Two times a day (BID) | ORAL | Status: AC

## 2012-02-03 MED ORDER — SODIUM CHLORIDE 0.9 % IV SOLN: Freq: Once | INTRAVENOUS | Status: DC

## 2012-02-03 MED ORDER — ONDANSETRON HCL 4 MG PO TABS: 4.0000 mg | ORAL_TABLET | Freq: Four times a day (QID) | ORAL | Status: AC

## 2012-02-03 MED ORDER — METRONIDAZOLE IN NACL 5-0.79 MG/ML-% IV SOLN
500.0000 mg | Freq: Once | INTRAVENOUS | Status: AC
  Administered 2012-02-03: 500 mg via INTRAVENOUS
  Filled 2012-02-03: qty 100

## 2012-02-03 MED ORDER — SODIUM CHLORIDE 0.9 % IV BOLUS (SEPSIS)
1000.0000 mL | Freq: Once | INTRAVENOUS | Status: AC
  Administered 2012-02-03: 1000 mL via INTRAVENOUS

## 2012-02-03 MED ORDER — CIPROFLOXACIN HCL 500 MG PO TABS: 500.0000 mg | ORAL_TABLET | Freq: Two times a day (BID) | ORAL | Status: AC

## 2012-02-03 MED ORDER — IOHEXOL 300 MG/ML  SOLN
100.0000 mL | Freq: Once | INTRAMUSCULAR | Status: AC | PRN
  Administered 2012-02-03: 100 mL via INTRAVENOUS

## 2012-02-03 MED ORDER — HYDROMORPHONE HCL PF 1 MG/ML IJ SOLN
1.0000 mg | Freq: Once | INTRAMUSCULAR | Status: AC
  Administered 2012-02-03: 1 mg via INTRAVENOUS
  Filled 2012-02-03: qty 1

## 2012-02-03 MED ORDER — ONDANSETRON HCL 4 MG/2ML IJ SOLN
4.0000 mg | Freq: Once | INTRAMUSCULAR | Status: AC
  Administered 2012-02-03: 4 mg via INTRAVENOUS
  Filled 2012-02-03: qty 2

## 2012-02-03 NOTE — Discharge Instructions (Signed)
Diverticulitis A diverticulum is a small pouch or sac on the colon. Diverticulosis is the presence of these diverticula on the colon. Diverticulitis is the irritation (inflammation) or infection of diverticula. CAUSES  The colon and its diverticula contain bacteria. If food particles block the tiny opening to a diverticulum, the bacteria inside can grow and cause an increase in pressure. This leads to infection and inflammation and is called diverticulitis. SYMPTOMS   Abdominal pain and tenderness. Usually, the pain is located on the left side of your abdomen. However, it could be located elsewhere.   Fever.   Bloating.   Feeling sick to your stomach (nausea).   Throwing up (vomiting).   Abnormal stools.  DIAGNOSIS  Your caregiver will take a history and perform a physical exam. Since many things can cause abdominal pain, other tests may be necessary. Tests may include:  Blood tests.   Urine tests.   X-ray of the abdomen.   CT scan of the abdomen.  Sometimes, surgery is needed to determine if diverticulitis or other conditions are causing your symptoms. TREATMENT  Most of the time, you can be treated without surgery. Treatment includes:  Resting the bowels by only having liquids for a few days. As you improve, you will need to eat a low-fiber diet.   Intravenous (IV) fluids if you are losing body fluids (dehydrated).   Antibiotic medicines that treat infections may be given.   Pain and nausea medicine, if needed.   Surgery if the inflamed diverticulum has burst.  HOME CARE INSTRUCTIONS   Try a clear liquid diet (broth, tea, or water for as long as directed by your caregiver). You may then gradually begin a low-fiber diet as tolerated. A low-fiber diet is a diet with less than 10 grams of fiber. Choose the foods below to reduce fiber in the diet:   White breads, cereals, rice, and pasta.   Cooked fruits and vegetables or soft fresh fruits and vegetables without the skin.     Ground or well-cooked tender beef, ham, veal, lamb, pork, or poultry.   Eggs and seafood.   After your diverticulitis symptoms have improved, your caregiver may put you on a high-fiber diet. A high-fiber diet includes 14 grams of fiber for every 1000 calories consumed. For a standard 2000 calorie diet, you would need 28 grams of fiber. Follow these diet guidelines to help you increase the fiber in your diet. It is important to slowly increase the amount fiber in your diet to avoid gas, constipation, and bloating.   Choose whole-grain breads, cereals, pasta, and brown rice.   Choose fresh fruits and vegetables with the skin on. Do not overcook vegetables because the more vegetables are cooked, the more fiber is lost.   Choose more nuts, seeds, legumes, dried peas, beans, and lentils.   Look for food products that have greater than 3 grams of fiber per serving on the Nutrition Facts label.   Take all medicine as directed by your caregiver.   If your caregiver has given you a follow-up appointment, it is very important that you go. Not going could result in lasting (chronic) or permanent injury, pain, and disability. If there is any problem keeping the appointment, call to reschedule.  SEEK MEDICAL CARE IF:   Your pain does not improve.   You have a hard time advancing your diet beyond clear liquids.   Your bowel movements do not return to normal.  SEEK IMMEDIATE MEDICAL CARE IF:   Your pain becomes   worse.   You have an oral temperature above 102 F (38.9 C), not controlled by medicine.   You have repeated vomiting.   You have bloody or black, tarry stools.   Symptoms that brought you to your caregiver become worse or are not getting better.  MAKE SURE YOU:   Understand these instructions.   Will watch your condition.   Will get help right away if you are not doing well or get worse.  Document Released: 07/06/2005 Document Revised: 09/15/2011 Document Reviewed:  11/01/2010 [employer] Patient Information 2012 [employer]. 

## 2012-02-03 NOTE — ED Notes (Signed)
Removed 20 gauge from right Antecubital site because fluid was not flowing properly.

## 2012-02-03 NOTE — ED Notes (Addendum)
Pt states he started to have abdominal pain yesterday morning, pain gradually got worst and that he took prilosec around 9:30p.m. pt states pain is currently at a 8-9

## 2012-02-03 NOTE — ED Provider Notes (Signed)
History     CSN: [id number]  Arrival date & time 02/03/12  8469   First MD Initiated Contact with Patient 02/03/12 [phone]      Chief Complaint  Patient presents with  . Abdominal Pain    (Consider location/radiation/quality/duration/timing/severity/associated sxs/prior treatment) Patient is a 46 y.o. male presenting with abdominal pain. The history is provided by the patient. No language interpreter was used.  Abdominal Pain The primary symptoms of the illness include abdominal pain and nausea. The primary symptoms of the illness do not include fever, fatigue, shortness of breath, vomiting, diarrhea, hematochezia or dysuria. The current episode started yesterday. The onset of the illness was gradual. The problem has been gradually worsening.  The abdominal pain began yesterday. The pain came on gradually. The abdominal pain has been gradually worsening since its onset. The abdominal pain is located in the LLQ and suprapubic region. The abdominal pain does not radiate. The abdominal pain is relieved by nothing. The abdominal pain is exacerbated by movement and certain positions.  Nausea began yesterday.  Symptoms associated with the illness do not include chills, constipation, urgency, frequency or back pain. Significant associated medical issues include diverticulitis.    Past Medical History  Diagnosis Date  . Hypertension   . Sarcoidosis   . Hyperlipemia   . Depression   . Diverticulitis   . CVA (cerebral vascular accident)   . Allergy     SEASONAL    Past Surgical History  Procedure Date  . Vertebral artery stenting 2008    Family History  Problem Relation Age of Onset  . Emphysema Paternal Grandfather   . Heart disease Paternal Grandfather   . Allergies Mother   . Hypertension Mother   . Skin cancer Father   . Colon cancer Neg Hx     History  Substance Use Topics  . Smoking status: Never Smoker   . Smokeless tobacco: Never Used  . Alcohol Use: No     seldom       Review of Systems  Constitutional: Negative for fever, chills, activity change, appetite change and fatigue.  HENT: Negative for congestion, sore throat, rhinorrhea, neck pain and neck stiffness.   Respiratory: Negative for cough and shortness of breath.   Cardiovascular: Negative for chest pain and palpitations.  Gastrointestinal: Positive for nausea and abdominal pain. Negative for vomiting, diarrhea, constipation and hematochezia.  Genitourinary: Negative for dysuria, urgency, frequency and flank pain.  Musculoskeletal: Negative for myalgias, back pain and arthralgias.  Neurological: Negative for dizziness, weakness, light-headedness, numbness and headaches.  All other systems reviewed and are negative.    Allergies  Celebrex; Sulfa antibiotics; and Codeine  Home Medications   Current Outpatient Rx  Name Route Sig Dispense Refill  . DIPHENHYDRAMINE HCL 25 MG PO TABS Oral Take 25 mg by mouth every 6 (six) hours as needed.    . IBUPROFEN 200 MG PO TABS Oral Take 200 mg by mouth every 6 (six) hours as needed. For pain    . LOSARTAN POTASSIUM 100 MG PO TABS Oral Take 50 mg by mouth daily.     Marland Kitchen OMEPRAZOLE 20 MG PO CPDR Oral Take 20 mg by mouth daily.    Marland Kitchen SIMVASTATIN 20 MG PO TABS Oral Take 20 mg by mouth at bedtime.      Marland Kitchen ZOLPIDEM TARTRATE 10 MG PO TABS Oral Take 10 mg by mouth at bedtime as needed. For sleep    . CIPROFLOXACIN HCL 500 MG PO TABS Oral Take 1 tablet (500  mg total) by mouth 2 (two) times daily. 28 tablet 0  . METRONIDAZOLE 500 MG PO TABS Oral Take 1 tablet (500 mg total) by mouth 2 (two) times daily. 28 tablet 0  . ONDANSETRON HCL 4 MG PO TABS Oral Take 1 tablet (4 mg total) by mouth every 6 (six) hours. 12 tablet 0  . OXYCODONE-ACETAMINOPHEN 5-325 MG PO TABS Oral Take 1-2 tablets by mouth every 6 (six) hours as needed for pain. 20 tablet 0    BP 113/66  Pulse 55  Temp(Src) 97.5 F (36.4 C) (Oral)  Resp 20  SpO2 100%  Physical Exam  Nursing note and  vitals reviewed. Constitutional: He is oriented to person, place, and time. He appears well-developed and well-nourished. No distress.  HENT:  Head: Normocephalic and atraumatic.  Mouth/Throat: Oropharynx is clear and moist.  Eyes: Conjunctivae and EOM are normal. Pupils are equal, round, and reactive to light.  Neck: Normal range of motion. Neck supple.  Cardiovascular: Normal rate, regular rhythm, normal heart sounds and intact distal pulses.  Exam reveals no gallop and no friction rub.   No murmur heard. Pulmonary/Chest: Effort normal and breath sounds normal. No respiratory distress. He exhibits no tenderness.  Abdominal: Soft. Bowel sounds are normal. There is tenderness. There is guarding. There is no rebound.  Musculoskeletal: Normal range of motion. He exhibits no tenderness.  Neurological: He is alert and oriented to person, place, and time. No cranial nerve deficit.  Skin: Skin is warm and dry.    ED Course  Procedures (including critical care time)  Labs Reviewed  CBC - Abnormal; Notable for the following:    WBC 13.2 (*)    All other components within normal limits  DIFFERENTIAL - Abnormal; Notable for the following:    Neutro Abs 10.0 (*)    Lymphocytes Relative 11 (*)    Monocytes Absolute 1.3 (*)    All other components within normal limits  COMPREHENSIVE METABOLIC PANEL - Abnormal; Notable for the following:    GFR calc non Af Amer 82 (*)    All other components within normal limits  LIPASE, BLOOD  URINALYSIS, ROUTINE W REFLEX MICROSCOPIC   Ct Abdomen Pelvis W Contrast  02/03/2012  *RADIOLOGY REPORT*  Clinical Data: Left lower quadrant pain.  History of high blood pressure, sarcoidosis, hyperlipidemia and diverticulitis.  CT ABDOMEN AND PELVIS WITH CONTRAST  Technique:  Multidetector CT imaging of the abdomen and pelvis was performed following the standard protocol during bolus administration of intravenous contrast.  Contrast: OMNIPAQUE IOHEXOL 300 MG/ML   SOLN  Comparison: None.  Findings: Inflammation of the descending colon with infiltration of surrounding fat planes extends to the junction with the sigmoid colon consistent with diverticulitis.  Follow-up after this has cleared recommend to evaluate for possibility underlying lesion. Tiny amount of free fluid in the pelvis may be related to this inflammatory process.  No free intraperitoneal air. No drainable abscess.  Lung bases clear.  Mild fatty infiltration of the liver.  Scattered small low density lesions within the liver may be cysts although incompletely assessed on the present exam.  No calcified gallstone. No focal splenic, pancreatic, right renal or adrenal lesion.  Left upper pole sub centimeter low density lesion may be a cyst.  This is too small to characterize.  No abdominal aortic aneurysm.  No pelvic or abdominal adenopathy. No bony destructive lesion.  Heart size top normal.  IMPRESSION: Descending colon diverticulitis as detailed above.  Too small to characterize sub centimeter  low density lesions within the liver and the left kidney may represent cysts.  Critical Value/emergent results were called by telephone at the time of interpretation on 02/03/2012  at 10:28 a.m.  to  Dr. Brooke Dare, who verbally acknowledged these results.  Original Report Authenticated By: Fuller Canada, M.D.     1. Diverticulitis       MDM  Uncomplicated diverticulitis. Received IV Cipro and Flagyl in the emergency department. His pain was adequately controlled. He'll be discharged home with a prescription for Percocet, Cipro, Flagyl, Zofran. Instructed to followup with his primary care physician. There is no surgical indication for admission.  Provided strict return precautions        Dayton Bailiff, MD 02/03/12 1133

## 2012-02-03 NOTE — ED Notes (Signed)
Per Dr. Brooke Dare pt will be ready for discharge after infusion of IV antibiotics.

## 2012-02-03 NOTE — ED Notes (Signed)
Pt. Is unable to use the restroom at this time he has a urinal at his side.

## 2012-04-20 ENCOUNTER — Telehealth: Payer: Self-pay | Admitting: Internal Medicine

## 2012-04-20 MED ORDER — METRONIDAZOLE 500 MG PO TABS: 500.0000 mg | ORAL_TABLET | Freq: Three times a day (TID) | ORAL | Status: AC

## 2012-04-20 MED ORDER — CIPROFLOXACIN HCL 500 MG PO TABS: 500.0000 mg | ORAL_TABLET | Freq: Two times a day (BID) | ORAL | Status: AC

## 2012-04-20 NOTE — Telephone Encounter (Signed)
[name] [name] pt. Pt states he woke up this morning at 5:30am with abdominal pain and cramping. States he thinks he is having a diverticulitis flare. States he has not had any problems for about 3 months. Usually takes cipro and flagyl and feels better. Dr. Rhea Belton as doc of the day please advise.

## 2012-04-20 NOTE — Telephone Encounter (Signed)
Pt aware. He will call back next week for an appt for follow-up. Rx sent to pharmacy.

## 2012-04-20 NOTE — Telephone Encounter (Signed)
Chart reviewed, patient had colonoscopy January 2013 revealing diverticulosis. I recommend empiric treatment for recurrent diverticulitis with ciprofloxacin 500 mg twice a day and metronidazole 500 mg 3 times a day for 10 days He should be followed in the office next week to ensure he improves

## 2012-06-15 ENCOUNTER — Ambulatory Visit (HOSPITAL_COMMUNITY)
Admission: RE | Admit: 2012-06-15 | Discharge: 2012-06-15 | Disposition: A | Discharge status: Home / Self Care | Payer: BC Managed Care – PPO | Attending: Psychiatry | Admitting: Psychiatry

## 2012-06-15 ENCOUNTER — Encounter (HOSPITAL_COMMUNITY): Payer: Self-pay | Admitting: *Deleted

## 2012-06-15 DIAGNOSIS — I10 Essential (primary) hypertension: Secondary | ICD-10-CM | POA: Insufficient documentation

## 2012-06-15 DIAGNOSIS — E7800 Pure hypercholesterolemia, unspecified: Secondary | ICD-10-CM

## 2012-06-15 DIAGNOSIS — F39 Unspecified mood [affective] disorder: Secondary | ICD-10-CM | POA: Insufficient documentation

## 2012-06-15 DIAGNOSIS — F411 Generalized anxiety disorder: Secondary | ICD-10-CM | POA: Insufficient documentation

## 2012-06-15 HISTORY — DX: Pure hypercholesterolemia, unspecified: E78.00

## 2012-06-15 NOTE — BH Assessment (Signed)
Assessment Note   [name] [name] is a 46 y.o. married white male.  He presents at the recommendation of his PCP, Dr Jacky Kindle, who had called at 08:20 to inform Assessment Department that he was advising pt to present at [facility].  He is accompanied by his sister, [name], who remained during assessment with pt's verbal consent.  Pt reports that for the past 2 - 3 weeks he has been depressed, had diminished sleep and appetite, and has had poor motivation to complete tasks.  Today he reportedly "broke down" while en route to work, describing symptoms such as anxiety, shakiness, and hyperventilation that are consistent with a panic attack, and later referring to the episode as such.  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  Pt called his mother, who called pt's PCP.  Pt reportedly had made a vague suicidal statement, but he reports not having any thoughts of taking his life.  He did, however, endorse feeling recently like his family would be better off without him, and thinking that maybe he should get in his car and drive off.  When asked about stressors, pt minimizes the significance of these, reporting that he and his wife, who is supportive, got married about a year ago, and they are expecting a baby on 07/10/2012.  He is somewhat anxious about this, not having had young children in the household for many years.  When prompted, he reports several deaths of loved ones recently including his father's best friend on 06/06/12, his grandmother 5 months ago, and his grandfather 1.5 years ago.  Again, pt denies SI currently or at any time in the past.  He denies any history of suicide attempts or of self mutilation.  He also denies HI or AH/VH.  He exhibits no delusional thought.  He denies any drug or alcohol problems, but does have a single alcoholic beverage occasionally.  He endorses depression with  symptoms as documented in the "risk to self" assessment below.  He reports that today's panic attack is the first he has ever had.  He denies any history of inpatient or outpatient behavioral health treatment, other than his PCP prescribing Lexapro and Ambien for him over the past few weeks.  Axis I:   Mood Disorder NOS 296.90              Anxiety Disorder NOS 300.00 Axis II:  Deferred 799.9 Axis III:  . Hypertension  . Sarcoidosis  . Hyperlipemia  . Depression  . Diverticulitis  . CVA (cerebral vascular accident)  . Allergy    SEASONAL  . Hypercholesteremia  Axis IV: Phase of Life Problems and Grief Problems Axis V:  50   Past Medical History:  Past Medical History  Diagnosis Date  . Hypertension   . Sarcoidosis   . Hyperlipemia   . Depression   . Diverticulitis   . CVA (cerebral vascular accident)   . Allergy     SEASONAL  . Hypercholesteremia 06/15/2012    Past Surgical History  Procedure Date  . Vertebral artery stenting 2008    Family History:  Family History  Problem Relation Age of Onset  . Emphysema Paternal Grandfather   . Heart disease Paternal Grandfather   . Allergies Mother   . Hypertension Mother   . Skin cancer Father   . Colon cancer Neg Hx  Social History:  reports that he has never smoked. He has never used smokeless tobacco. He reports that he drinks alcohol. He reports that he does not use illicit drugs.  Additional Social History:  Alcohol / Drug Use Pain Medications: Denies Prescriptions: Denies Over the Counter: Denies History of alcohol / drug use?: Yes Substance #1 Name of Substance 1: Alcohol 1 - Age of First Use: Unspecified 1 - Amount (size/oz): 1 drink 1 - Frequency: occasionally 1 - Duration: Unspecified 1 - Last Use / Amount: Unspecified  CIWA:   COWS:    Allergies:  Allergies  Allergen Reactions  . Celebrex (Celecoxib)     Mother is allergic, so I do not take it  . Sulfa Antibiotics Other (See Comments)     unknown  . Codeine Rash    Home Medications:  (Not in a hospital admission)  OB/GYN Status:  No LMP for male patient.  General Assessment Data Location of Assessment: [facility] Living Arrangements: Spouse/significant other;Children (Spouse ([redacted] weeks gestational), 16 y/o daughter) Can pt return to current living arrangement?: Yes Admission Status: Voluntary Is patient capable of signing voluntary admission?: Yes Transfer from: Home Referral Source: MD (Dr Jacky Kindle)  Education Status Is patient currently in school?: No  Risk to self Suicidal Ideation: Yes-Currently Present (Made vague passive suicidal statement to Dr Jacky Kindle.) Suicidal Intent: No Is patient at risk for suicide?: No Suicidal Plan?: No Access to Means: No What has been your use of drugs/alcohol within the last 12 months?: Occasional use of alcohol Previous Attempts/Gestures: No How many times?: 0  Other Self Harm Risks: Contracts for safety; sites family & desire to live as deterrents; has had thoughts of getting in car & driving off, but not killing himself. Triggers for Past Attempts: Other (Comment) (Not applicable) Intentional Self Injurious Behavior: None Family Suicide History: No Recent stressful life event(s): Other (Comment) (Recently married, expecting child, deaths in family) Persecutory voices/beliefs?: No Depression: Yes Depression Symptoms: Insomnia;Tearfulness;Fatigue;Loss of interest in usual pleasures (Reports feeling worthless at times.) Substance abuse history and/or treatment for substance abuse?: No Suicide prevention information given to non-admitted patients: Yes  Risk to Others Homicidal Ideation: No Thoughts of Harm to Others: No Current Homicidal Intent: No Current Homicidal Plan: No Access to Homicidal Means: No Identified Victim: None History of harm to others?: No Assessment of Violence: None Noted Violent Behavior Description: Calm/cooperative Does patient have  access to weapons?: Yes (Comment) (Pt has a shotgun) Criminal Charges Pending?: No Does patient have a court date: No  Psychosis Hallucinations: None noted Delusions: None noted  Mental Status Report Appear/Hygiene: Other (Comment) (Casual) Eye Contact: Fair Motor Activity: Unremarkable Speech: Other (Comment) (Unremarkable) Level of Consciousness: Alert Mood: Anxious;Other (Comment) (Pleasant) Affect: Appropriate to circumstance Anxiety Level: Minimal (Had first panic attack ever today.) Thought Processes: Coherent;Relevant Judgement: Unimpaired Orientation: Person;Place;Situation;Time Obsessive Compulsive Thoughts/Behaviors: None  Cognitive Functioning Concentration: Decreased Memory: Recent Intact;Remote Intact IQ: Average Insight: Fair Impulse Control: Good Appetite: Fair Weight Loss: 6  (over past week) Weight Gain: 0  Sleep: Decreased Total Hours of Sleep: 5  (Mid & terminal insomnia x 2 months) Vegetative Symptoms: Staying in bed  ADLScreening [facility]) Patient's cognitive ability adequate to safely complete daily activities?: Yes Patient able to express need for assistance with ADLs?: Yes Independently performs ADLs?: Yes (appropriate for developmental age)  Abuse/Neglect [facility]) Physical Abuse: Denies Verbal Abuse: Denies Sexual Abuse: Denies  Prior Inpatient Therapy Prior Inpatient Therapy: No Prior Therapy Dates: None Prior Therapy Facilty/Provider(s):  None Reason for Treatment: None  Prior Outpatient Therapy Prior Outpatient Therapy: No Prior Therapy Dates: None other than psychotropics from her PCP. Prior Therapy Facilty/Provider(s): None Reason for Treatment: None  ADL Screening (condition at time of admission) Patient's cognitive ability adequate to safely complete daily activities?: Yes Patient able to express need for assistance with ADLs?: Yes Independently performs ADLs?: Yes (appropriate for developmental age) Weakness of  Legs: None Weakness of Arms/Hands: None  Home Assistive Devices/Equipment Home Assistive Devices/Equipment: None    Abuse/Neglect Assessment (Assessment to be complete while patient is alone) Physical Abuse: Denies Verbal Abuse: Denies Sexual Abuse: Denies Exploitation of patient/patient's resources: Denies Self-Neglect: Denies     Merchant navy officer (For Healthcare) Advance Directive: Patient does not have advance directive;Patient would not like information Pre-existing out of facility DNR order (yellow form or pink MOST form): No Nutrition Screen- [facility] Adult/[facility]/AP Patient's home diet: Regular Have you recently lost weight without trying?: Yes If yes, how much weight have you lost?: 2-13 lb Have you been eating poorly because of a decreased appetite?: Yes Malnutrition Screening Tool Score: 2   Additional Information 1:1 In Past 12 Months?: No CIRT Risk: No Elopement Risk: No Does patient have medical clearance?: No     Disposition:  Disposition Disposition of Patient: Referred to [facility] Out[facility]; [facility]; [facility]) Patient referred to: Outpatient clinic referral;Other (Comment) [facility] Out[facility]; [facility]; [facility]) Discussed pt with Aggie Nwoko.  She believes that pt does not require hospitalization at this time, provided that he is able to contract for safety.  She wanted it to be communicated to pt that if his mood worsens, and especially if SI becomes more profound, he is to go directly to ER.  This was reported to pt.  He signed a Engineer, manufacturing systems and accepted written referrals to the [facility], [facility], and [facility].  On Site Evaluation by:   Reviewed with Physician:  Serena Colonel, NP @ 09:50   Raphael Gibney 06/15/2012 12:08 PM

## 2012-08-22 ENCOUNTER — Ambulatory Visit (INDEPENDENT_AMBULATORY_CARE_PROVIDER_SITE_OTHER): Payer: BC Managed Care – PPO | Admitting: Nurse Practitioner

## 2012-08-22 ENCOUNTER — Telehealth: Payer: Self-pay | Admitting: Internal Medicine

## 2012-08-22 ENCOUNTER — Encounter: Payer: Self-pay | Admitting: Nurse Practitioner

## 2012-08-22 ENCOUNTER — Other Ambulatory Visit (INDEPENDENT_AMBULATORY_CARE_PROVIDER_SITE_OTHER): Payer: BC Managed Care – PPO

## 2012-08-22 VITALS — BP 110/62 | HR 75 | Ht 72.05 in | Wt 226.2 lb

## 2012-08-22 DIAGNOSIS — R1032 Left lower quadrant pain: Secondary | ICD-10-CM

## 2012-08-22 DIAGNOSIS — K5790 Diverticulosis of intestine, part unspecified, without perforation or abscess without bleeding: Secondary | ICD-10-CM

## 2012-08-22 DIAGNOSIS — K5730 Diverticulosis of large intestine without perforation or abscess without bleeding: Secondary | ICD-10-CM

## 2012-08-22 LAB — CBC WITH DIFFERENTIAL/PLATELET
Eosinophils Relative: 2.4 % (ref 0.0–5.0)
HCT: 46.9 % (ref 39.0–52.0)
Hemoglobin: 15.7 g/dL (ref 13.0–17.0)
Lymphs Abs: 2 10*3/uL (ref 0.7–4.0)
MCV: 94.3 fl (ref 78.0–100.0)
Monocytes Absolute: 2.4 10*3/uL — ABNORMAL HIGH (ref 0.1–1.0)
Neutro Abs: 13.8 10*3/uL — ABNORMAL HIGH (ref 1.4–7.7)
Platelets: 313 10*3/uL (ref 150.0–400.0)
WBC: 18.9 10*3/uL (ref 4.5–10.5)

## 2012-08-22 LAB — SEDIMENTATION RATE: Sed Rate: 22 mm/hr (ref 0–22)

## 2012-08-22 MED ORDER — METRONIDAZOLE 250 MG PO TABS: 250.0000 mg | ORAL_TABLET | Freq: Two times a day (BID) | ORAL | Status: AC

## 2012-08-22 MED ORDER — CIPROFLOXACIN HCL 250 MG PO TABS: 250.0000 mg | ORAL_TABLET | Freq: Two times a day (BID) | ORAL | Status: AC

## 2012-08-22 MED ORDER — TRAMADOL HCL 50 MG PO TABS: 50.0000 mg | ORAL_TABLET | Freq: Four times a day (QID) | ORAL | Status: DC | PRN

## 2012-08-22 NOTE — Progress Notes (Signed)
08/22/2012 ZORAWAR STROLLO [id number] November 08, 1965   History of Present Illness:  Patient is a 46 year old male known to Dr. Marina Goodell. Patient was seen in January of this year for evaluation of recurrent bouts of lower abdominal pain, presumably diverticulitis. A CT scan of the abdomen and pelvis in September 2012 showed sigmoid diverticulosis without diverticulitis. Colonoscopy by Dr. Marina Goodell in January of this year revealed moderate diverticulosis in transverse to sigmoid colon. Exam was otherwise normal. Since January patient has done relatively well. He is here today for evaluation of recurrent left lower abdominal pain. The pain began last night, it escalated over time. No fever. No nausea or vomiting. No bowel changes. Pain is reminiscent of diverticulitis. No urinary symptoms.  Current Medications, Allergies, Past Medical History, Past Surgical History, Family History and Social History were reviewed in [employer] record.   Physical Exam: General: Well developed , white in no acute distress Head: Normocephalic and atraumatic Eyes:  sclerae anicteric, conjunctiva pink  Ears: Normal auditory acuity Lungs: Clear throughout to auscultation Heart: Regular rate and rhythm Abdomen: Soft, nondistended. Significant left lower quadrant tenderness .No masses, no hepatomegaly. Normal bowel sounds Musculoskeletal: Symmetrical with no gross deformities  Extremities: No edema  Neurological: Alert oriented x 4, grossly nonfocal Psychological:  Alert and cooperative. Normal mood and affect  Assessment and Recommendations:  Recurrent left lower quadrant pain, presumably diverticulitis. Patient has documented diverticulosis without diverticulitis (CT scan last year).  His symptoms and exam today suggest diverticulitis. Ideally diverticulitis could be documented by CTscan but in the interest of avoiding radiation exposure I will not repeat CTscan today.  I will obtain a CBC, ESR and  CRP.Marland Kitchen Patient will be treated with 10 days of Cipro and Flagyl. Recommend clear liquids until feeling better. For pain will prescribe Ultram . Patient will followup with Dr. Marina Goodell, his primary gastroenterologist, in 4-6 weeks. He knows to call sooner if symptoms do not improve in the interim.

## 2012-08-22 NOTE — Telephone Encounter (Signed)
Pt states he is having abdominal pain and thinks that his diverticulitis is flaring up. Requesting to be seen. Pt scheduled to see Willette Cluster NP today at 2:30pm. Pt aware of appt date and time.

## 2012-08-22 NOTE — Patient Instructions (Addendum)
Please go to the basement level to have your labs drawn.   We sent prescriptions for Flagyl and Cipro and Ultram to [facility] [address]. Drink clear liquids until you are feeling better. Follow up with Dr. Marina Goodell within 4-6 weeks.

## 2012-08-22 NOTE — Progress Notes (Signed)
Agree with assessment and plans 

## 2012-09-26 ENCOUNTER — Ambulatory Visit: Payer: Self-pay | Admitting: Internal Medicine

## 2012-10-05 ENCOUNTER — Telehealth: Payer: Self-pay

## 2012-10-05 NOTE — Telephone Encounter (Signed)
Sent No-show letter to patient

## 2012-12-06 ENCOUNTER — Other Ambulatory Visit: Payer: Self-pay | Admitting: Internal Medicine

## 2012-12-06 DIAGNOSIS — R4781 Slurred speech: Secondary | ICD-10-CM

## 2012-12-06 DIAGNOSIS — F070 Personality change due to known physiological condition: Secondary | ICD-10-CM

## 2012-12-07 ENCOUNTER — Ambulatory Visit
Admission: RE | Admit: 2012-12-07 | Discharge: 2012-12-07 | Disposition: A | Discharge status: Home / Self Care | Payer: BC Managed Care – PPO | Source: Ambulatory Visit | Attending: Internal Medicine | Admitting: Internal Medicine

## 2012-12-07 DIAGNOSIS — R51 Headache: Secondary | ICD-10-CM

## 2012-12-07 MED ORDER — GADOBENATE DIMEGLUMINE 529 MG/ML IV SOLN
20.0000 mL | Freq: Once | INTRAVENOUS | Status: AC | PRN
  Administered 2012-12-07: 20 mL via INTRAVENOUS

## 2013-05-28 ENCOUNTER — Encounter (HOSPITAL_COMMUNITY): Payer: Self-pay | Admitting: Emergency Medicine

## 2013-05-28 ENCOUNTER — Emergency Department (HOSPITAL_COMMUNITY)
Admission: EM | Admit: 2013-05-28 | Discharge: 2013-05-28 | Disposition: A | Discharge status: Home / Self Care | Payer: BC Managed Care – PPO | Attending: Emergency Medicine | Admitting: Emergency Medicine

## 2013-05-28 DIAGNOSIS — Z8719 Personal history of other diseases of the digestive system: Secondary | ICD-10-CM | POA: Insufficient documentation

## 2013-05-28 DIAGNOSIS — H53149 Visual discomfort, unspecified: Secondary | ICD-10-CM | POA: Insufficient documentation

## 2013-05-28 DIAGNOSIS — Z79899 Other long term (current) drug therapy: Secondary | ICD-10-CM | POA: Insufficient documentation

## 2013-05-28 DIAGNOSIS — I10 Essential (primary) hypertension: Secondary | ICD-10-CM | POA: Insufficient documentation

## 2013-05-28 DIAGNOSIS — Z8673 Personal history of transient ischemic attack (TIA), and cerebral infarction without residual deficits: Secondary | ICD-10-CM | POA: Insufficient documentation

## 2013-05-28 DIAGNOSIS — Z8619 Personal history of other infectious and parasitic diseases: Secondary | ICD-10-CM | POA: Insufficient documentation

## 2013-05-28 DIAGNOSIS — Z8659 Personal history of other mental and behavioral disorders: Secondary | ICD-10-CM | POA: Insufficient documentation

## 2013-05-28 DIAGNOSIS — Z8639 Personal history of other endocrine, nutritional and metabolic disease: Secondary | ICD-10-CM | POA: Insufficient documentation

## 2013-05-28 DIAGNOSIS — Z7982 Long term (current) use of aspirin: Secondary | ICD-10-CM | POA: Insufficient documentation

## 2013-05-28 DIAGNOSIS — Z862 Personal history of diseases of the blood and blood-forming organs and certain disorders involving the immune mechanism: Secondary | ICD-10-CM | POA: Insufficient documentation

## 2013-05-28 DIAGNOSIS — G43909 Migraine, unspecified, not intractable, without status migrainosus: Secondary | ICD-10-CM | POA: Insufficient documentation

## 2013-05-28 MED ORDER — DIPHENHYDRAMINE HCL 50 MG/ML IJ SOLN
25.0000 mg | Freq: Once | INTRAMUSCULAR | Status: AC
  Administered 2013-05-28: 25 mg via INTRAVENOUS
  Filled 2013-05-28: qty 1

## 2013-05-28 MED ORDER — DEXAMETHASONE SODIUM PHOSPHATE 10 MG/ML IJ SOLN
10.0000 mg | Freq: Once | INTRAMUSCULAR | Status: AC
  Administered 2013-05-28: 10 mg via INTRAVENOUS
  Filled 2013-05-28: qty 1

## 2013-05-28 MED ORDER — KETOROLAC TROMETHAMINE 30 MG/ML IJ SOLN
30.0000 mg | Freq: Once | INTRAMUSCULAR | Status: AC
  Administered 2013-05-28: 30 mg via INTRAVENOUS
  Filled 2013-05-28: qty 1

## 2013-05-28 MED ORDER — SODIUM CHLORIDE 0.9 % IV BOLUS (SEPSIS)
500.0000 mL | Freq: Once | INTRAVENOUS | Status: AC
  Administered 2013-05-28: 500 mL via INTRAVENOUS

## 2013-05-28 MED ORDER — PROMETHAZINE HCL 25 MG PO TABS: 25.0000 mg | ORAL_TABLET | Freq: Four times a day (QID) | ORAL | Status: DC | PRN

## 2013-05-28 MED ORDER — PROMETHAZINE HCL 25 MG/ML IJ SOLN
25.0000 mg | Freq: Once | INTRAMUSCULAR | Status: AC
  Administered 2013-05-28: 25 mg via INTRAVENOUS
  Filled 2013-05-28: qty 1

## 2013-05-28 MED ORDER — SODIUM CHLORIDE 0.9 % IV SOLN
Freq: Once | INTRAVENOUS | Status: AC
  Administered 2013-05-28: 12:00:00 via INTRAVENOUS

## 2013-05-28 NOTE — ED Provider Notes (Signed)
CSN: [id number]     Arrival date & time 05/28/13  0957 History     First MD Initiated Contact with Patient 05/28/13 1012     Chief Complaint  Patient presents with  . Headache   (Consider location/radiation/quality/duration/timing/severity/associated sxs/prior Treatment) HPI  [name] [name] is a 47 y.o.male with a significant PMH of TBI, stroke, hyperlipidemia, sarcoidosis, depression, diverticulitis, CVA, allergies  presents to the ER with complaints of migraine for 3 days. He has a history of migraines that usually last a day or two and go away with Excedrin. He went to his PCP yesterday for the headache and was given hydrocodeine and had his ears, nose and throat checked and was told he had a migraine. Concerned that it is still present today. Denies that he feels like he is having another stroke, no difficulty ambulating, nausea, weakness, pain, or neurological symptoms. Some mild phonophobia and photophobia.   Past Medical History  Diagnosis Date  . Hypertension   . Sarcoidosis   . Hyperlipemia   . Depression   . Diverticulitis   . CVA (cerebral vascular accident)   . Allergy     SEASONAL  . Hypercholesteremia 06/15/2012   Past Surgical History  Procedure Laterality Date  . Vertebral artery stenting  2008   Family History  Problem Relation Age of Onset  . Emphysema Paternal Grandfather   . Heart disease Paternal Grandfather   . Allergies Mother   . Hypertension Mother   . Skin cancer Father   . Colon cancer Neg Hx    History  Substance Use Topics  . Smoking status: Never Smoker   . Smokeless tobacco: Never Used  . Alcohol Use: Yes     Comment: seldom    Review of Systems ROS is negative unless otherwise stated in the HPI  Allergies  Celebrex; Sulfa antibiotics; and Codeine  Home Medications   Current Outpatient Rx  Name  Route  Sig  Dispense  Refill  . aspirin 325 MG tablet   Oral   Take 325 mg by mouth daily.         . diphenhydrAMINE  (BENADRYL) 25 MG tablet   Oral   Take 25 mg by mouth every 6 (six) hours as needed for allergies.          Marland Kitchen HYDROcodone-acetaminophen (NORCO/VICODIN) 5-325 MG per tablet   Oral   Take 1 tablet by mouth every 6 (six) hours as needed for pain.         Marland Kitchen ibuprofen (ADVIL,MOTRIN) 200 MG tablet   Oral   Take 200 mg by mouth every 6 (six) hours as needed. For pain         . losartan (COZAAR) 100 MG tablet   Oral   Take 50 mg by mouth daily.          . methocarbamol (ROBAXIN) 500 MG tablet   Oral   Take 500 mg by mouth 3 (three) times daily as needed.         . zolpidem (AMBIEN) 10 MG tablet   Oral   Take 10 mg by mouth at bedtime as needed. For sleep         . promethazine (PHENERGAN) 25 MG tablet   Oral   Take 1 tablet (25 mg total) by mouth every 6 (six) hours as needed for nausea.   12 tablet   0    BP 96/55  Pulse 57  Temp(Src) 98 F (36.7 C) (Oral)  Resp 16  SpO2 94% Physical Exam  Nursing note and vitals reviewed. Constitutional: He is oriented to person, place, and time. He appears well-developed and well-nourished. No distress.  HENT:  Head: Normocephalic and atraumatic.   HEENT: Anicteric.  No pallor.  No discharge from ears, eyes, nose, or mouth.   Eyes: Pupils are equal, round, and reactive to light.  Neck: Normal range of motion. Neck supple.  Cardiovascular: Normal rate and regular rhythm.   Pulmonary/Chest: Effort normal.  Abdominal: Soft.  Neurological: He is alert and oriented to person, place, and time. He has normal strength. No cranial nerve deficit or sensory deficit. He displays a negative Romberg sign. GCS eye subscore is 4. GCS verbal subscore is 5. GCS motor subscore is 6.  Skin: Skin is warm and dry.    ED Course   Procedures (including critical care time)  Labs Reviewed - No data to display No results found. 1. Migraine     MDM  Patient given IV saline 1,500 mLs in the ED, Decardon 10mg  IV, Benadryl 25mg  IV, and  Phenergan. His pain has resolved to a 3/10. We ambulated him and he is feeling much better. Given 30mg  IV Toradol and now has complete resolution of pain. Advised to follow-up with PCP. Given Rx for Phenergan.  47 y.o.Gerarda Fraction Kuhner's evaluation in the Emergency Department is complete. It has been determined that no acute conditions requiring further emergency intervention are present at this time. The patient/guardian have been advised of the diagnosis and plan. We have discussed signs and symptoms that warrant return to the ED, such as changes or worsening in symptoms.  Vital signs are stable at discharge. Filed Vitals:   05/28/13 1400  BP: 96/55  Pulse: 57  Temp:   Resp:     Patient/guardian has voiced understanding and agreed to follow-up with the PCP or specialist.   Dorthula Matas, PA-C 05/28/13 1451
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
## 2013-05-29 NOTE — ED Provider Notes (Signed)
Medical screening examination/treatment/procedure(s) were performed by non-physician practitioner and as supervising physician I was immediately available for consultation/collaboration.  Toy Baker, MD 05/29/13 [phone]

## 2013-12-11 ENCOUNTER — Other Ambulatory Visit: Payer: Self-pay | Admitting: Medical Oncology

## 2014-01-06 ENCOUNTER — Emergency Department (HOSPITAL_COMMUNITY): Payer: BC Managed Care – PPO

## 2014-01-06 ENCOUNTER — Encounter (HOSPITAL_COMMUNITY): Payer: Self-pay | Admitting: Emergency Medicine

## 2014-01-06 ENCOUNTER — Emergency Department (HOSPITAL_COMMUNITY)
Admission: EM | Admit: 2014-01-06 | Discharge: 2014-01-06 | Disposition: A | Discharge status: Home / Self Care | Payer: BC Managed Care – PPO | Attending: Emergency Medicine | Admitting: Emergency Medicine

## 2014-01-06 DIAGNOSIS — Z8639 Personal history of other endocrine, nutritional and metabolic disease: Secondary | ICD-10-CM | POA: Insufficient documentation

## 2014-01-06 DIAGNOSIS — K5792 Diverticulitis of intestine, part unspecified, without perforation or abscess without bleeding: Secondary | ICD-10-CM

## 2014-01-06 DIAGNOSIS — K5732 Diverticulitis of large intestine without perforation or abscess without bleeding: Secondary | ICD-10-CM | POA: Insufficient documentation

## 2014-01-06 DIAGNOSIS — Z8619 Personal history of other infectious and parasitic diseases: Secondary | ICD-10-CM | POA: Insufficient documentation

## 2014-01-06 DIAGNOSIS — Z79899 Other long term (current) drug therapy: Secondary | ICD-10-CM | POA: Insufficient documentation

## 2014-01-06 DIAGNOSIS — Z8673 Personal history of transient ischemic attack (TIA), and cerebral infarction without residual deficits: Secondary | ICD-10-CM | POA: Insufficient documentation

## 2014-01-06 DIAGNOSIS — Z862 Personal history of diseases of the blood and blood-forming organs and certain disorders involving the immune mechanism: Secondary | ICD-10-CM | POA: Insufficient documentation

## 2014-01-06 DIAGNOSIS — Z7982 Long term (current) use of aspirin: Secondary | ICD-10-CM | POA: Insufficient documentation

## 2014-01-06 DIAGNOSIS — Z8659 Personal history of other mental and behavioral disorders: Secondary | ICD-10-CM | POA: Insufficient documentation

## 2014-01-06 DIAGNOSIS — I10 Essential (primary) hypertension: Secondary | ICD-10-CM | POA: Insufficient documentation

## 2014-01-06 LAB — COMPREHENSIVE METABOLIC PANEL
ALT: 21 U/L (ref 0–53)
AST: 17 U/L (ref 0–37)
Albumin: 3.7 g/dL (ref 3.5–5.2)
Alkaline Phosphatase: 70 U/L (ref 39–117)
BILIRUBIN TOTAL: 1.1 mg/dL (ref 0.3–1.2)
BUN: 10 mg/dL (ref 6–23)
CALCIUM: 9.3 mg/dL (ref 8.4–10.5)
CHLORIDE: 97 meq/L (ref 96–112)
CO2: 26 meq/L (ref 19–32)
Creatinine, Ser: 1.12 mg/dL (ref 0.50–1.35)
GFR calc Af Amer: >90 mL/min (ref >90)
GFR, EST NON AFRICAN AMERICAN: 78 mL/min — AB (ref >90)
Glucose, Bld: 108 mg/dL — ABNORMAL HIGH (ref 70–99)
POTASSIUM: 3.8 meq/L (ref 3.7–5.3)
SODIUM: 136 meq/L — AB (ref 137–147)
Total Protein: 7.3 g/dL (ref 6.0–8.3)

## 2014-01-06 LAB — CBC WITH DIFFERENTIAL/PLATELET
BASOS ABS: 0.1 10*3/uL (ref 0.0–0.1)
Basophils Relative: 0 % (ref 0–1)
Eosinophils Absolute: 0.4 10*3/uL (ref 0.0–0.7)
Eosinophils Relative: 3 % (ref 0–5)
HCT: 44.4 % (ref 39.0–52.0)
Hemoglobin: 15.7 g/dL (ref 13.0–17.0)
LYMPHS PCT: 10 % — AB (ref 12–46)
Lymphs Abs: 1.4 10*3/uL (ref 0.7–4.0)
MCH: 31.5 pg (ref 26.0–34.0)
MCHC: 35.4 g/dL (ref 30.0–36.0)
MCV: 89 fL (ref 78.0–100.0)
Monocytes Absolute: 1.8 10*3/uL — ABNORMAL HIGH (ref 0.1–1.0)
Monocytes Relative: 12 % (ref 3–12)
NEUTROS ABS: 11.5 10*3/uL — AB (ref 1.7–7.7)
Neutrophils Relative %: 76 % (ref 43–77)
PLATELETS: 306 10*3/uL (ref 150–400)
RBC: 4.99 MIL/uL (ref 4.22–5.81)
RDW: 13.6 % (ref 11.5–15.5)
WBC: 15.2 10*3/uL — AB (ref 4.0–10.5)

## 2014-01-06 LAB — URINALYSIS, ROUTINE W REFLEX MICROSCOPIC
Bilirubin Urine: NEGATIVE
GLUCOSE, UA: NEGATIVE mg/dL
HGB URINE DIPSTICK: NEGATIVE
Ketones, ur: NEGATIVE mg/dL
Leukocytes, UA: NEGATIVE
Nitrite: NEGATIVE
PROTEIN: NEGATIVE mg/dL
Specific Gravity, Urine: 1.017 (ref 1.005–1.030)
Urobilinogen, UA: 0.2 mg/dL (ref 0.0–1.0)
pH: 7 (ref 5.0–8.0)

## 2014-01-06 LAB — LIPASE, BLOOD: Lipase: 17 U/L (ref 11–59)

## 2014-01-06 MED ORDER — HYDROCODONE-ACETAMINOPHEN 5-325 MG PO TABS: 1.0000 | ORAL_TABLET | Freq: Four times a day (QID) | ORAL | Status: DC | PRN

## 2014-01-06 MED ORDER — SODIUM CHLORIDE 0.9 % IV BOLUS (SEPSIS)
1000.0000 mL | Freq: Once | INTRAVENOUS | Status: AC
  Administered 2014-01-06: 1000 mL via INTRAVENOUS

## 2014-01-06 MED ORDER — ONDANSETRON HCL 4 MG/2ML IJ SOLN
4.0000 mg | Freq: Once | INTRAMUSCULAR | Status: AC
  Administered 2014-01-06: 4 mg via INTRAVENOUS
  Filled 2014-01-06: qty 2

## 2014-01-06 MED ORDER — CIPROFLOXACIN IN D5W 400 MG/200ML IV SOLN
400.0000 mg | Freq: Once | INTRAVENOUS | Status: AC
  Administered 2014-01-06: 400 mg via INTRAVENOUS
  Filled 2014-01-06: qty 200

## 2014-01-06 MED ORDER — IOHEXOL 300 MG/ML  SOLN
100.0000 mL | Freq: Once | INTRAMUSCULAR | Status: AC | PRN
  Administered 2014-01-06: 100 mL via INTRAVENOUS

## 2014-01-06 MED ORDER — METRONIDAZOLE IN NACL 5-0.79 MG/ML-% IV SOLN
500.0000 mg | Freq: Once | INTRAVENOUS | Status: AC
  Administered 2014-01-06: 500 mg via INTRAVENOUS
  Filled 2014-01-06: qty 100

## 2014-01-06 MED ORDER — METRONIDAZOLE 500 MG PO TABS: 500.0000 mg | ORAL_TABLET | Freq: Four times a day (QID) | ORAL | Status: DC

## 2014-01-06 MED ORDER — HYDROMORPHONE HCL PF 1 MG/ML IJ SOLN
1.0000 mg | Freq: Once | INTRAMUSCULAR | Status: AC
  Administered 2014-01-06: 1 mg via INTRAVENOUS
  Filled 2014-01-06: qty 1

## 2014-01-06 MED ORDER — IOHEXOL 300 MG/ML  SOLN
50.0000 mL | Freq: Once | INTRAMUSCULAR | Status: AC | PRN
  Administered 2014-01-06: 50 mL via ORAL

## 2014-01-06 MED ORDER — ONDANSETRON HCL 8 MG PO TABS: 8.0000 mg | ORAL_TABLET | Freq: Three times a day (TID) | ORAL | Status: DC | PRN

## 2014-01-06 MED ORDER — CIPROFLOXACIN HCL 500 MG PO TABS: 500.0000 mg | ORAL_TABLET | Freq: Two times a day (BID) | ORAL | Status: DC

## 2014-01-06 NOTE — Discharge Instructions (Signed)
We discussed your case with Dr Lanell MatarAronson's partner, who indicates for you to follow up with their midlevel provider tomorrow in the office for recheck. Clear liquid diet for the next day. Take cipro and flagyl (antibiotics) as prescribed. Take motrin or aleve as need for pain. You may also take hydrocodone as need for pain. No driving for the next 6 hours or when taking hydrocodone. Also, do not take tylenol or acetaminophen containing medication when taking hydrocodone. You may take zofran as need for nausea.  Return to ER if worse, severe pain, persistent vomiting, weak/faint, other concern.   Diverticulitis A diverticulum is a small pouch or sac on the colon. Diverticulosis is the presence of these diverticula on the colon. Diverticulitis is the irritation (inflammation) or infection of diverticula. CAUSES  The colon and its diverticula contain bacteria. If food particles block the tiny opening to a diverticulum, the bacteria inside can grow and cause an increase in pressure. This leads to infection and inflammation and is called diverticulitis. SYMPTOMS   Abdominal pain and tenderness. Usually, the pain is located on the left side of your abdomen. However, it could be located elsewhere.  Fever.  Bloating.  Feeling sick to your stomach (nausea).  Throwing up (vomiting).  Abnormal stools. DIAGNOSIS  Your caregiver will take a history and perform a physical exam. Since many things can cause abdominal pain, other tests may be necessary. Tests may include:  Blood tests.  Urine tests.  X-ray of the abdomen.  CT scan of the abdomen. Sometimes, surgery is needed to determine if diverticulitis or other conditions are causing your symptoms. TREATMENT  Most of the time, you can be treated without surgery. Treatment includes:  Resting the bowels by only having liquids for a few days. As you improve, you will need to eat a low-fiber diet.  Intravenous (IV) fluids if you are losing body  fluids (dehydrated).  Antibiotic medicines that treat infections may be given.  Pain and nausea medicine, if needed.  Surgery if the inflamed diverticulum has burst. HOME CARE INSTRUCTIONS   Try a clear liquid diet (broth, tea, or water for as long as directed by your caregiver). You may then gradually begin a low-fiber diet as tolerated.  A low-fiber diet is a diet with less than 10 grams of fiber. Choose the foods below to reduce fiber in the diet:  White breads, cereals, rice, and pasta.  Cooked fruits and vegetables or soft fresh fruits and vegetables without the skin.  Ground or well-cooked tender beef, ham, veal, lamb, pork, or poultry.  Eggs and seafood.  After your diverticulitis symptoms have improved, your caregiver may put you on a high-fiber diet. A high-fiber diet includes 14 grams of fiber for every 1000 calories consumed. For a standard 2000 calorie diet, you would need 28 grams of fiber. Follow these diet guidelines to help you increase the fiber in your diet. It is important to slowly increase the amount fiber in your diet to avoid gas, constipation, and bloating.  Choose whole-grain breads, cereals, pasta, and brown rice.  Choose fresh fruits and vegetables with the skin on. Do not overcook vegetables because the more vegetables are cooked, the more fiber is lost.  Choose more nuts, seeds, legumes, dried peas, beans, and lentils.  Look for food products that have greater than 3 grams of fiber per serving on the Nutrition Facts label.  Take all medicine as directed by your caregiver.  If your caregiver has given you a follow-up appointment, it  is very important that you go. Not going could result in lasting (chronic) or permanent injury, pain, and disability. If there is any problem keeping the appointment, call to reschedule. SEEK MEDICAL CARE IF:   Your pain does not improve.  You have a hard time advancing your diet beyond clear liquids.  Your bowel  movements do not return to normal. SEEK IMMEDIATE MEDICAL CARE IF:   Your pain becomes worse.  You have an oral temperature above 102 F (38.9 C), not controlled by medicine.  You have repeated vomiting.  You have bloody or black, tarry stools.  Symptoms that brought you to your caregiver become worse or are not getting better. MAKE SURE YOU:   Understand these instructions.  Will watch your condition.  Will get help right away if you are not doing well or get worse. Document Released: 07/06/2005 Document Revised: 12/19/2011 Document Reviewed: 11/01/2010 [facility] Patient Information 2014 [place], [place].

## 2014-01-06 NOTE — ED Notes (Signed)
History of diverticulitis; woke up Sunday morning with abd pain; otc meds not working; lower abd pain; pain with movement; no nausea/vomiting;

## 2014-01-06 NOTE — ED Provider Notes (Signed)
CSN: [id number]     Arrival date & time 01/06/14  4098 History   First MD Initiated Contact with Patient 01/06/14 [phone]     Chief Complaint  Patient presents with  . Abdominal Pain     (Consider location/radiation/quality/duration/timing/severity/associated sxs/prior Treatment) Patient is a 48 y.o. male presenting with abdominal pain.  Abdominal Pain Associated symptoms: no chest pain, no constipation, no cough, no diarrhea, no dysuria, no fever, no shortness of breath, no sore throat and no vomiting   pt with hx diverticula, c/o lower abdominal pain, L>R for the past 2-3 days. Constant. Dull. Moderate. Worse w movement and palpation. Somewhat similar to remote/prior episode diverticulitis. Denies trauma or fall. No radiation of pain or back pain. No scrotal or testicular pain. Having normal bms. No diarrhea or constipation. No dysuria or hematuria. No fever or chills. Decreased appetite. No abd distension or prior abd surgery.      Past Medical History  Diagnosis Date  . Hypertension   . Sarcoidosis   . Hyperlipemia   . Depression   . Diverticulitis   . CVA (cerebral vascular accident)   . Allergy     SEASONAL  . Hypercholesteremia 06/15/2012   Past Surgical History  Procedure Laterality Date  . Vertebral artery stenting  2008   Family History  Problem Relation Age of Onset  . Emphysema Paternal Grandfather   . Heart disease Paternal Grandfather   . Allergies Mother   . Hypertension Mother   . Skin cancer Father   . Colon cancer Neg Hx    History  Substance Use Topics  . Smoking status: Never Smoker   . Smokeless tobacco: Never Used  . Alcohol Use: Yes     Comment: seldom    Review of Systems  Constitutional: Negative for fever.  HENT: Negative for sore throat.   Eyes: Negative for redness.  Respiratory: Negative for cough and shortness of breath.   Cardiovascular: Negative for chest pain.  Gastrointestinal: Positive for abdominal pain. Negative for vomiting,  diarrhea and constipation.  Genitourinary: Negative for dysuria and flank pain.  Musculoskeletal: Negative for back pain and neck pain.  Skin: Negative for rash.  Neurological: Negative for headaches.  Hematological: Does not bruise/bleed easily.  Psychiatric/Behavioral: Negative for confusion.      Allergies  Celebrex; Sulfa antibiotics; and Codeine  Home Medications   Current Outpatient Rx  Name  Route  Sig  Dispense  Refill  . losartan (COZAAR) 100 MG tablet   Oral   Take 50 mg by mouth daily.          Marland Kitchen aspirin 325 MG tablet   Oral   Take 325 mg by mouth daily.         . diphenhydrAMINE (BENADRYL) 25 MG tablet   Oral   Take 25 mg by mouth every 6 (six) hours as needed for allergies.          Marland Kitchen HYDROcodone-acetaminophen (NORCO/VICODIN) 5-325 MG per tablet   Oral   Take 1 tablet by mouth every 6 (six) hours as needed for pain.         Marland Kitchen ibuprofen (ADVIL,MOTRIN) 200 MG tablet   Oral   Take 200 mg by mouth every 6 (six) hours as needed. For pain         . zolpidem (AMBIEN) 10 MG tablet   Oral   Take 10 mg by mouth at bedtime as needed. For sleep          BP 107/70  Pulse 76  Temp(Src) 98.1 F (36.7 C)  Resp 20  SpO2 98% Physical Exam  Nursing note and vitals reviewed. Constitutional: He is oriented to person, place, and time. He appears well-developed and well-nourished. No distress.  HENT:  Mouth/Throat: Oropharynx is clear and moist.  Eyes: Conjunctivae are normal. No scleral icterus.  Neck: Neck supple. No tracheal deviation present.  Cardiovascular: Normal rate, regular rhythm, normal heart sounds and intact distal pulses.   Pulmonary/Chest: Effort normal and breath sounds normal. No accessory muscle usage. No respiratory distress.  Abdominal: Soft. Bowel sounds are normal. He exhibits no distension and no mass. There is tenderness. There is no rebound and no guarding.  Lower abd tenderness. No rebound or guarding. No incarc hernia.    Genitourinary:  No cva tenderness. Testes desc, no scrotal or testicular pain, swelling or tenderness.   Musculoskeletal: Normal range of motion. He exhibits no edema and no tenderness.  Neurological: He is alert and oriented to person, place, and time.  Skin: Skin is warm and dry.  Psychiatric: He has a normal mood and affect.    ED Course  Procedures (including critical care time)   Results for orders placed during the hospital encounter of 01/06/14  CBC WITH DIFFERENTIAL      Result Value Ref Range   WBC 15.2 (*) 4.0 - 10.5 K/uL   RBC 4.99  4.22 - 5.81 MIL/uL   Hemoglobin 15.7  13.0 - 17.0 g/dL   HCT 16.1  09.6 - 04.5 %   MCV 89.0  78.0 - 100.0 fL   MCH 31.5  26.0 - 34.0 pg   MCHC 35.4  30.0 - 36.0 g/dL   RDW 40.9  81.1 - 91.4 %   Platelets 306  150 - 400 K/uL   Neutrophils Relative % 76  43 - 77 %   Neutro Abs 11.5 (*) 1.7 - 7.7 K/uL   Lymphocytes Relative 10 (*) 12 - 46 %   Lymphs Abs 1.4  0.7 - 4.0 K/uL   Monocytes Relative 12  3 - 12 %   Monocytes Absolute 1.8 (*) 0.1 - 1.0 K/uL   Eosinophils Relative 3  0 - 5 %   Eosinophils Absolute 0.4  0.0 - 0.7 K/uL   Basophils Relative 0  0 - 1 %   Basophils Absolute 0.1  0.0 - 0.1 K/uL  COMPREHENSIVE METABOLIC PANEL      Result Value Ref Range   Sodium 136 (*) 137 - 147 mEq/L   Potassium 3.8  3.7 - 5.3 mEq/L   Chloride 97  96 - 112 mEq/L   CO2 26  19 - 32 mEq/L   Glucose, Bld 108 (*) 70 - 99 mg/dL   BUN 10  6 - 23 mg/dL   Creatinine, Ser 7.82  0.50 - 1.35 mg/dL   Calcium 9.3  8.4 - 95.6 mg/dL   Total Protein 7.3  6.0 - 8.3 g/dL   Albumin 3.7  3.5 - 5.2 g/dL   AST 17  0 - 37 U/L   ALT 21  0 - 53 U/L   Alkaline Phosphatase 70  39 - 117 U/L   Total Bilirubin 1.1  0.3 - 1.2 mg/dL   GFR calc non Af Amer 78 (*) >90 mL/min   GFR calc Af Amer >90  >90 mL/min  LIPASE, BLOOD      Result Value Ref Range   Lipase 17  11 - 59 U/L  URINALYSIS, ROUTINE W REFLEX MICROSCOPIC  Result Value Ref Range   Color, Urine YELLOW   YELLOW   APPearance CLEAR  CLEAR   Specific Gravity, Urine 1.017  1.005 - 1.030   pH 7.0  5.0 - 8.0   Glucose, UA NEGATIVE  NEGATIVE mg/dL   Hgb urine dipstick NEGATIVE  NEGATIVE   Bilirubin Urine NEGATIVE  NEGATIVE   Ketones, ur NEGATIVE  NEGATIVE mg/dL   Protein, ur NEGATIVE  NEGATIVE mg/dL   Urobilinogen, UA 0.2  0.0 - 1.0 mg/dL   Nitrite NEGATIVE  NEGATIVE   Leukocytes, UA NEGATIVE  NEGATIVE   Ct Abdomen Pelvis W Contrast  01/06/2014   CLINICAL DATA:  History of diverticulitis; 24 hour history of abdominal pain  EXAM: CT ABDOMEN AND PELVIS WITH CONTRAST  TECHNIQUE: Multidetector CT imaging of the abdomen and pelvis was performed using the standard protocol following bolus administration of intravenous contrast.  CONTRAST:  100mL OMNIPAQUE IOHEXOL 300 MG/ML SOLN intravenously; the patient also received oral contrast material.  COMPARISON:  CT ABD/PELVIS W CM dated 02/03/2012  FINDINGS: There is an abnormal appearance of the proximal sigmoid colon. There is diverticulosis but there is mural thickening and marked increase in the perisigmoid fat density consistent with acute inflammation. There is no discrete abscess. There is a small amount of fluid posterior and lateral to the inflamed sigmoid. There is a trace of free fluid in the pelvis. No free extraluminal gas collections are demonstrated though certainly contained perforation cannot be absolutely excluded.  More proximally there is exuberant transverse colonic diverticulosis with scattered diverticula in the ascending and transverse colons. The appendix is normal in appearance. The small bowel exhibits no evidence of inflammation nor of obstruction.  The liver exhibits mildly decreased density diffusely consistent with fatty infiltration. There are stable sub cm hypodensities most compatible with cysts. The gallbladder, pancreas, spleen, adrenal glands, and kidneys exhibit no acute abnormalities. The stomach is distended with oral contrast. The  caliber of the abdominal aorta is normal. The urinary bladder and prostate gland exhibit no acute abnormalities.  The lung bases are clear. The lumbar vertebral bodies are preserved in height. The bony pelvis exhibits no acute abnormalities.  IMPRESSION: 1. The findings are consistent with acute proximal sigmoid diverticulitis. There is a small amount of surrounding free fluid and a small amount more inferiorly in the pelvis. There is no discrete drainable abscess. There is no evidence of bowel obstruction. No free air collections are demonstrated. 2. More proximally the small and large bowel exhibit no acute abnormalities. There are exuberant diverticula throughout the transverse colon. 3. There is no acute hepatobiliary abnormality. There are subcentimeter hypodensities in the liver consistent with stable cysts. No acute urinary tract abnormality is demonstrated. There is no intra abdominal nor pelvic lymphadenopathy.   Electronically Signed   By: David  [place]azil[place]   On: 01/06/2014 10:39      MDM  Iv ns bolus. Dilaudid 1 mg iv. zofran iv.  Labs. Ct.  Reviewed nursing notes and prior charts for additional history.   Recheck nausea on return from ct.  Iv ns bolus. zofran iv.  Lower abd tenderness/esp L on repeat exam, but no peritoneal signs.   Discussed pt and ct w on call provider for Dr Jacky KindleAronson, Dr Waynard EdwardsPerini, including diverticulitis, small amt free fluid - he indicates they can follow up tomorrow in office for recheck, we will give iv abx in ED, ivf, rx cipro and flagyl for home.  Pt given option of admit/iv abx vs home on po abx  w close follow up - pt prefers home.  Recheck, vitals normal, pt comfortable. No nv. No peritoneal findings on exam.    Appears stable flr d/c w pcp follow up tomorrow.     Suzi Roots, MD 01/06/14 [phone]

## 2014-01-20 ENCOUNTER — Other Ambulatory Visit: Payer: Self-pay | Admitting: Gastroenterology

## 2014-01-20 DIAGNOSIS — K5792 Diverticulitis of intestine, part unspecified, without perforation or abscess without bleeding: Secondary | ICD-10-CM

## 2014-01-21 ENCOUNTER — Inpatient Hospital Stay
Admission: RE | Admit: 2014-01-21 | Discharge: 2014-01-21 | Disposition: A | Discharge status: Home / Self Care | Payer: Self-pay | Source: Ambulatory Visit | Attending: Gastroenterology | Admitting: Gastroenterology

## 2014-01-27 ENCOUNTER — Other Ambulatory Visit: Payer: Self-pay

## 2014-01-27 ENCOUNTER — Ambulatory Visit (HOSPITAL_COMMUNITY)
Admission: RE | Admit: 2014-01-27 | Discharge: 2014-01-27 | Disposition: A | Discharge status: Home / Self Care | Payer: BC Managed Care – PPO | Source: Ambulatory Visit | Attending: Gastroenterology | Admitting: Gastroenterology

## 2014-01-27 ENCOUNTER — Other Ambulatory Visit (HOSPITAL_COMMUNITY): Payer: Self-pay | Admitting: Gastroenterology

## 2014-01-27 DIAGNOSIS — K5792 Diverticulitis of intestine, part unspecified, without perforation or abscess without bleeding: Secondary | ICD-10-CM

## 2014-01-27 DIAGNOSIS — K5732 Diverticulitis of large intestine without perforation or abscess without bleeding: Secondary | ICD-10-CM | POA: Insufficient documentation

## 2014-01-27 MED ORDER — IOHEXOL 300 MG/ML  SOLN
100.0000 mL | Freq: Once | INTRAMUSCULAR | Status: AC | PRN
Start: 2014-01-27 — End: 2014-01-27
  Administered 2014-01-27: 100 mL via INTRAVENOUS

## 2014-01-30 ENCOUNTER — Other Ambulatory Visit: Payer: Self-pay

## 2014-03-24 ENCOUNTER — Encounter (HOSPITAL_COMMUNITY): Payer: Self-pay | Admitting: Emergency Medicine

## 2014-03-24 ENCOUNTER — Emergency Department (HOSPITAL_COMMUNITY)
Admission: EM | Admit: 2014-03-24 | Discharge: 2014-03-24 | Disposition: A | Discharge status: Home / Self Care | Payer: BC Managed Care – PPO | Attending: Emergency Medicine | Admitting: Emergency Medicine

## 2014-03-24 ENCOUNTER — Emergency Department (HOSPITAL_COMMUNITY): Payer: BC Managed Care – PPO

## 2014-03-24 DIAGNOSIS — Z8619 Personal history of other infectious and parasitic diseases: Secondary | ICD-10-CM | POA: Insufficient documentation

## 2014-03-24 DIAGNOSIS — K5732 Diverticulitis of large intestine without perforation or abscess without bleeding: Secondary | ICD-10-CM | POA: Insufficient documentation

## 2014-03-24 DIAGNOSIS — F3289 Other specified depressive episodes: Secondary | ICD-10-CM | POA: Insufficient documentation

## 2014-03-24 DIAGNOSIS — E785 Hyperlipidemia, unspecified: Secondary | ICD-10-CM | POA: Insufficient documentation

## 2014-03-24 DIAGNOSIS — Z79899 Other long term (current) drug therapy: Secondary | ICD-10-CM | POA: Insufficient documentation

## 2014-03-24 DIAGNOSIS — F329 Major depressive disorder, single episode, unspecified: Secondary | ICD-10-CM | POA: Insufficient documentation

## 2014-03-24 DIAGNOSIS — Z8673 Personal history of transient ischemic attack (TIA), and cerebral infarction without residual deficits: Secondary | ICD-10-CM | POA: Insufficient documentation

## 2014-03-24 DIAGNOSIS — E7800 Pure hypercholesterolemia, unspecified: Secondary | ICD-10-CM | POA: Insufficient documentation

## 2014-03-24 DIAGNOSIS — I10 Essential (primary) hypertension: Secondary | ICD-10-CM | POA: Insufficient documentation

## 2014-03-24 DIAGNOSIS — K5792 Diverticulitis of intestine, part unspecified, without perforation or abscess without bleeding: Secondary | ICD-10-CM

## 2014-03-24 LAB — CBC WITH DIFFERENTIAL/PLATELET
Basophils Absolute: 0.1 10*3/uL (ref 0.0–0.1)
Basophils Relative: 1 % (ref 0–1)
EOS ABS: 0.4 10*3/uL (ref 0.0–0.7)
EOS PCT: 4 % (ref 0–5)
HEMATOCRIT: 42.6 % (ref 39.0–52.0)
HEMOGLOBIN: 14.4 g/dL (ref 13.0–17.0)
LYMPHS ABS: 1.8 10*3/uL (ref 0.7–4.0)
Lymphocytes Relative: 18 % (ref 12–46)
MCH: 30 pg (ref 26.0–34.0)
MCHC: 33.8 g/dL (ref 30.0–36.0)
MCV: 88.8 fL (ref 78.0–100.0)
MONOS PCT: 7 % (ref 3–12)
Monocytes Absolute: 0.8 10*3/uL (ref 0.1–1.0)
Neutro Abs: 7.1 10*3/uL (ref 1.7–7.7)
Neutrophils Relative %: 70 % (ref 43–77)
Platelets: 308 10*3/uL (ref 150–400)
RBC: 4.8 MIL/uL (ref 4.22–5.81)
RDW: 13.8 % (ref 11.5–15.5)
WBC: 10.1 10*3/uL (ref 4.0–10.5)

## 2014-03-24 LAB — URINALYSIS, ROUTINE W REFLEX MICROSCOPIC
Bilirubin Urine: NEGATIVE
Glucose, UA: NEGATIVE mg/dL
Hgb urine dipstick: NEGATIVE
Ketones, ur: NEGATIVE mg/dL
Leukocytes, UA: NEGATIVE
Nitrite: NEGATIVE
Protein, ur: NEGATIVE mg/dL
SPECIFIC GRAVITY, URINE: 1.007 (ref 1.005–1.030)
Urobilinogen, UA: 0.2 mg/dL (ref 0.0–1.0)
pH: 6.5 (ref 5.0–8.0)

## 2014-03-24 LAB — COMPREHENSIVE METABOLIC PANEL
ALK PHOS: 70 U/L (ref 39–117)
ALT: 18 U/L (ref 0–53)
AST: 17 U/L (ref 0–37)
Albumin: 3.7 g/dL (ref 3.5–5.2)
BUN: 9 mg/dL (ref 6–23)
CALCIUM: 9.1 mg/dL (ref 8.4–10.5)
CO2: 26 mEq/L (ref 19–32)
Chloride: 103 mEq/L (ref 96–112)
Creatinine, Ser: 1.05 mg/dL (ref 0.50–1.35)
GFR calc Af Amer: >90 mL/min (ref >90)
GFR calc non Af Amer: 84 mL/min — ABNORMAL LOW (ref >90)
GLUCOSE: 111 mg/dL — AB (ref 70–99)
POTASSIUM: 3.8 meq/L (ref 3.7–5.3)
Sodium: 141 mEq/L (ref 137–147)
TOTAL PROTEIN: 7.2 g/dL (ref 6.0–8.3)
Total Bilirubin: 0.8 mg/dL (ref 0.3–1.2)

## 2014-03-24 LAB — LIPASE, BLOOD: Lipase: 20 U/L (ref 11–59)

## 2014-03-24 MED ORDER — IOHEXOL 300 MG/ML  SOLN
100.0000 mL | Freq: Once | INTRAMUSCULAR | Status: AC | PRN
  Administered 2014-03-24: 100 mL via INTRAVENOUS

## 2014-03-24 MED ORDER — OXYCODONE-ACETAMINOPHEN 5-325 MG PO TABS: 2.0000 | ORAL_TABLET | ORAL | Status: DC | PRN

## 2014-03-24 MED ORDER — MORPHINE SULFATE 4 MG/ML IJ SOLN
4.0000 mg | Freq: Once | INTRAMUSCULAR | Status: AC
  Administered 2014-03-24: 4 mg via INTRAVENOUS
  Filled 2014-03-24: qty 1

## 2014-03-24 MED ORDER — AMOXICILLIN-POT CLAVULANATE 875-125 MG PO TABS: 1.0000 | ORAL_TABLET | Freq: Two times a day (BID) | ORAL | Status: AC

## 2014-03-24 MED ORDER — IOHEXOL 300 MG/ML  SOLN
50.0000 mL | Freq: Once | INTRAMUSCULAR | Status: AC | PRN
Start: 2014-03-24 — End: 2014-03-24
  Administered 2014-03-24: 50 mL via ORAL

## 2014-03-24 NOTE — ED Notes (Signed)
Patient reports lower abdominal pain starting on Sunday with diarrhea, no n/v.

## 2014-03-24 NOTE — ED Provider Notes (Signed)
CSN: [id number]     Arrival date & time 03/24/14  1142 History   First MD Initiated Contact with Patient 03/24/14 1240     Chief Complaint  Patient presents with  . Abdominal Pain   HPI  [name] [name] is a 48 y.o. male with a PMH of diverticulitis, HTN, CVA, hypercholesteremia, sarcoidosis, seasonal allergies, and depression who presents to the ED for evaluation of abdominal pain. History was provided by the patient. Patient states that he developed gradually worsening abdominal pain yesterday. His pain is located in his lower abdomen diffusely with the left worse than the right. He states laying flat improves his pain and movement worsens it. Describes a constant dull aching pain with intermittent sharp pains. He did not take anything for pain PTA. He has had similar pain in the past with his diverticulitis. Patient states that he called his GI specialist Dr. Bosie ClosSchooler with [facility] today who recommended going to the ED for CT scan. States he saw him 4 days ago during a follow-up appointment and had LLQ tenderness on exam at that time. Patient also complains of diarrhea with no hematochezia. No emesis, nausea, constipation, dysuria, hematuria, testicular pain, fever, chills.    Past Medical History  Diagnosis Date  . Hypertension   . Sarcoidosis   . Hyperlipemia   . Depression   . Diverticulitis   . CVA (cerebral vascular accident)   . Allergy     SEASONAL  . Hypercholesteremia 06/15/2012   Past Surgical History  Procedure Laterality Date  . Vertebral artery stenting  2008   Family History  Problem Relation Age of Onset  . Emphysema Paternal Grandfather   . Heart disease Paternal Grandfather   . Allergies Mother   . Hypertension Mother   . Skin cancer Father   . Colon cancer Neg Hx    History  Substance Use Topics  . Smoking status: Never Smoker   . Smokeless tobacco: Never Used  . Alcohol Use: Yes     Comment: seldom    Review of Systems  Constitutional: Negative for  fever, chills, activity change, appetite change and fatigue.  HENT: Negative for congestion and rhinorrhea.   Respiratory: Negative for cough and shortness of breath.   Cardiovascular: Negative for chest pain and leg swelling.  Gastrointestinal: Positive for abdominal pain and diarrhea. Negative for nausea, vomiting, constipation and blood in stool.  Genitourinary: Negative for dysuria, urgency, frequency, hematuria, flank pain, discharge, penile swelling, difficulty urinating, penile pain and testicular pain.  Musculoskeletal: Negative for back pain and myalgias.  Neurological: Negative for dizziness, weakness, light-headedness and headaches.    Allergies  Celebrex; Sulfa antibiotics; Codeine; and Dilaudid  Home Medications   Prior to Admission medications   Medication Sig Start Date End Date Taking? Authorizing Provider  diphenhydrAMINE (BENADRYL) 25 MG tablet Take 25 mg by mouth every 6 (six) hours as needed for allergies.    Yes Historical Provider, MD  hyoscyamine (LEVSIN SL) 0.125 MG SL tablet Place 0.125 mg under the tongue every 6 (six) hours as needed for cramping.   Yes Historical Provider, MD  ibuprofen (ADVIL,MOTRIN) 200 MG tablet Take 400 mg by mouth every 6 (six) hours as needed (for pain). For pain   Yes Historical Provider, MD  losartan-hydrochlorothiazide (HYZAAR) 50-12.5 MG per tablet Take 0.5 tablets by mouth daily. 11/18/13  Yes Historical Provider, MD  zolpidem (AMBIEN) 10 MG tablet Take 10 mg by mouth at bedtime as needed. For sleep   Yes Historical Provider,  MD   BP 120/83  Pulse 56  Temp(Src) 97.7 F (36.5 C) (Oral)  Resp 16  SpO2 100%  Filed Vitals:   03/24/14 1150 03/24/14 1440 03/24/14 1612  BP: 120/83 127/74 134/89  Pulse: 56 44 60  Temp: 97.7 F (36.5 C) 97.9 F (36.6 C) 98 F (36.7 C)  TempSrc: Oral Oral Oral  Resp: 16 16 18   SpO2: 100% 100% 100%    Physical Exam  Nursing note and vitals reviewed. Constitutional: He is oriented to person,  place, and time. He appears well-developed and well-nourished. No distress.  Non-toxic  HENT:  Head: Normocephalic and atraumatic.  Right Ear: External ear normal.  Left Ear: External ear normal.  Nose: Nose normal.  Mouth/Throat: Oropharynx is clear and moist.  Eyes: Conjunctivae are normal. Right eye exhibits no discharge. Left eye exhibits no discharge.  Neck: Normal range of motion. Neck supple.  Cardiovascular: Normal rate, regular rhythm and normal heart sounds.  Exam reveals no gallop and no friction rub.   No murmur heard. Pulmonary/Chest: Effort normal and breath sounds normal. No respiratory distress. He has no wheezes. He has no rales. He exhibits no tenderness.  Abdominal: Soft. He exhibits no distension and no mass. There is tenderness. There is no rebound and no guarding.  LLQ tenderness  Musculoskeletal: Normal range of motion. He exhibits no edema and no tenderness.  No CVA, lumbar, or flank tenderness bilaterally.   Neurological: He is alert and oriented to person, place, and time.  Skin: Skin is warm and dry. He is not diaphoretic.    ED Course  Procedures (including critical care time) Labs Review Labs Reviewed  COMPREHENSIVE METABOLIC PANEL - Abnormal; Notable for the following:    Glucose, Bld 111 (*)    GFR calc non Af Amer 84 (*)    All other components within normal limits  CBC WITH DIFFERENTIAL  LIPASE, BLOOD  URINALYSIS, ROUTINE W REFLEX MICROSCOPIC    Imaging Review Ct Abdomen Pelvis W Contrast  03/24/2014   CLINICAL DATA:  Left lower quadrant pain.  EXAM: CT ABDOMEN AND PELVIS WITH CONTRAST  TECHNIQUE: Multidetector CT imaging of the abdomen and pelvis was performed using the standard protocol following bolus administration of intravenous contrast.  CONTRAST:  50mL OMNIPAQUE IOHEXOL 300 MG/ML SOLN, OMNIPAQUE IOHEXOL 300 MG/ML SOLN  COMPARISON:  01/27/2014  FINDINGS: Lung bases are clear.  No effusions.  Heart is normal size.  13 mm hypodensity in  the left hepatic lobe is stable since prior study, compatible with cyst. No new hepatic lesions. Spleen, pancreas, gallbladder, adrenals and kidneys are normal.  Sigmoid and descending colonic diverticulosis. Slight inflammatory change around the mid to distal descending colon in the area of prior inflammation. While this could reflect chronic changes/scarring related to prior diverticulitis, I cannot exclude recurrent diverticulitis. No free air or focal fluid collection. Small bowel is decompressed. Stomach unremarkable. No free air or adenopathy. Urinary bladder is unremarkable. Aorta is normal caliber.  No acute bony abnormality or focal bone lesion.  IMPRESSION: Descending colonic and sigmoid diverticulosis. Mild stranding around the mid descending colon, similar to prior study. While this could reflect chronic changes/scarring related to prior bouts of diverticulitis, I cannot exclude acute recurrent diverticulitis in this area.   Electronically Signed   By: Charlett Nose M.D.   On: 03/24/2014 15:24     EKG Interpretation None      Results for orders placed during the hospital encounter of 03/24/14  CBC WITH DIFFERENTIAL  Result Value Ref Range   WBC 10.1  4.0 - 10.5 K/uL   RBC 4.80  4.22 - 5.81 MIL/uL   Hemoglobin 14.4  13.0 - 17.0 g/dL   HCT 78.442.6  69.639.0 - 29.552.0 %   MCV 88.8  78.0 - 100.0 fL   MCH 30.0  26.0 - 34.0 pg   MCHC 33.8  30.0 - 36.0 g/dL   RDW 28.413.8  13.211.5 - 44.015.5 %   Platelets 308  150 - 400 K/uL   Neutrophils Relative % 70  43 - 77 %   Neutro Abs 7.1  1.7 - 7.7 K/uL   Lymphocytes Relative 18  12 - 46 %   Lymphs Abs 1.8  0.7 - 4.0 K/uL   Monocytes Relative 7  3 - 12 %   Monocytes Absolute 0.8  0.1 - 1.0 K/uL   Eosinophils Relative 4  0 - 5 %   Eosinophils Absolute 0.4  0.0 - 0.7 K/uL   Basophils Relative 1  0 - 1 %   Basophils Absolute 0.1  0.0 - 0.1 K/uL  COMPREHENSIVE METABOLIC PANEL      Result Value Ref Range   Sodium 141  137 - 147 mEq/L   Potassium 3.8  3.7 -  5.3 mEq/L   Chloride 103  96 - 112 mEq/L   CO2 26  19 - 32 mEq/L   Glucose, Bld 111 (*) 70 - 99 mg/dL   BUN 9  6 - 23 mg/dL   Creatinine, Ser 1.021.05  0.50 - 1.35 mg/dL   Calcium 9.1  8.4 - 72.510.5 mg/dL   Total Protein 7.2  6.0 - 8.3 g/dL   Albumin 3.7  3.5 - 5.2 g/dL   AST 17  0 - 37 U/L   ALT 18  0 - 53 U/L   Alkaline Phosphatase 70  39 - 117 U/L   Total Bilirubin 0.8  0.3 - 1.2 mg/dL   GFR calc non Af Amer 84 (*) >90 mL/min   GFR calc Af Amer >90  >90 mL/min  LIPASE, BLOOD      Result Value Ref Range   Lipase 20  11 - 59 U/L  URINALYSIS, ROUTINE W REFLEX MICROSCOPIC      Result Value Ref Range   Color, Urine YELLOW  YELLOW   APPearance CLEAR  CLEAR   Specific Gravity, Urine 1.007  1.005 - 1.030   pH 6.5  5.0 - 8.0   Glucose, UA NEGATIVE  NEGATIVE mg/dL   Hgb urine dipstick NEGATIVE  NEGATIVE   Bilirubin Urine NEGATIVE  NEGATIVE   Ketones, ur NEGATIVE  NEGATIVE mg/dL   Protein, ur NEGATIVE  NEGATIVE mg/dL   Urobilinogen, UA 0.2  0.0 - 1.0 mg/dL   Nitrite NEGATIVE  NEGATIVE   Leukocytes, UA NEGATIVE  NEGATIVE     MDM   [name] CampanileDonald S Sweatman is a 48 y.o. male with a PMH of diverticulitis, HTN, CVA, hypercholesteremia, sarcoidosis, seasonal allergies, and depression who presents to the ED for evaluation of abdominal pain. Etiology of abdominal pain likely due to diverticulitis. Patient's CT scan shows possible recurrent vs chronic changes/scarring. Spoke with patient's GI specialist who recommends starting Augmentin (did not tolerate Flagyl well) and follow-up in clinic. Abdominal exam benign. Patient afebrile and non-toxic in appearance. Vital signs sable. Labs unremarkable. Patient had improvements in his pain throughout his ED visit. Return precautions, discharge instructions, and follow-up was discussed with the patient before discharge.    Rechecks  4:00 PM = Patient  states pain has improved. Ready for discharge.   Consults 3:34 PM = Spoke with Dr. Bosie Clos who states to start  Augmentin and follow-up in clinic. Will likely need surgical consult as OP.    Discharge Medication List as of 03/24/2014  4:05 PM    START taking these medications   Details  amoxicillin-clavulanate (AUGMENTIN) 875-125 MG per tablet Take 1 tablet by mouth 2 (two) times daily., Starting 03/24/2014, Last dose on Mon 04/07/14, Print    oxyCODONE-acetaminophen (PERCOCET/ROXICET) 5-325 MG per tablet Take 2 tablets by mouth every 4 (four) hours as needed for severe pain., Starting 03/24/2014, Until Discontinued, Print         Final impressions: 1. Diverticulitis       Greer Ee Ogden Handlin PA-C            Jillyn Ledger, PA-C 03/24/14 2105

## 2014-03-24 NOTE — ED Notes (Signed)
Initial Contact - pt resting on stretcher, reports hx of diverticulitis and similar pain with past episodes.  Reports lower abd pain x2-3 days.  Denies fevers/chills, n/v/constipation.  Pt is well appearing.  Skin PWD.  A+Ox4.  MAEI.  Changed to hospital gown.  NAD.

## 2014-03-24 NOTE — Discharge Instructions (Signed)
Eat easy to digest foods  Drink fluids  Take percocet for severe pain - Please be careful with this medication.  It can cause drowsiness.  Use caution while driving, operating machinery, drinking alcohol, or any other activities that may impair your physical or mental abilities.   Return to the emergency department if you develop any changing/worsening condition, fever, repeated vomiting, severe or worsening pain or any other concerns (please read additional information regarding your condition below)   Diverticulitis A diverticulum is a small pouch or sac on the colon. Diverticulosis is the presence of these diverticula on the colon. Diverticulitis is the irritation (inflammation) or infection of diverticula. CAUSES  The colon and its diverticula contain bacteria. If food particles block the tiny opening to a diverticulum, the bacteria inside can grow and cause an increase in pressure. This leads to infection and inflammation and is called diverticulitis. SYMPTOMS   Abdominal pain and tenderness. Usually, the pain is located on the left side of your abdomen. However, it could be located elsewhere.  Fever.  Bloating.  Feeling sick to your stomach (nausea).  Throwing up (vomiting).  Abnormal stools. DIAGNOSIS  Your caregiver will take a history and perform a physical exam. Since many things can cause abdominal pain, other tests may be necessary. Tests may include:  Blood tests.  Urine tests.  X-ray of the abdomen.  CT scan of the abdomen. Sometimes, surgery is needed to determine if diverticulitis or other conditions are causing your symptoms. TREATMENT  Most of the time, you can be treated without surgery. Treatment includes:  Resting the bowels by only having liquids for a few days. As you improve, you will need to eat a low-fiber diet.  Intravenous (IV) fluids if you are losing body fluids (dehydrated).  Antibiotic medicines that treat infections may be given.  Pain and  nausea medicine, if needed.  Surgery if the inflamed diverticulum has burst. HOME CARE INSTRUCTIONS   Try a clear liquid diet (broth, tea, or water for as long as directed by your caregiver). You may then gradually begin a low-fiber diet as tolerated.  A low-fiber diet is a diet with less than 10 grams of fiber. Choose the foods below to reduce fiber in the diet:  White breads, cereals, rice, and pasta.  Cooked fruits and vegetables or soft fresh fruits and vegetables without the skin.  Ground or well-cooked tender beef, ham, veal, lamb, pork, or poultry.  Eggs and seafood.  After your diverticulitis symptoms have improved, your caregiver may put you on a high-fiber diet. A high-fiber diet includes 14 grams of fiber for every 1000 calories consumed. For a standard 2000 calorie diet, you would need 28 grams of fiber. Follow these diet guidelines to help you increase the fiber in your diet. It is important to slowly increase the amount fiber in your diet to avoid gas, constipation, and bloating.  Choose whole-grain breads, cereals, pasta, and brown rice.  Choose fresh fruits and vegetables with the skin on. Do not overcook vegetables because the more vegetables are cooked, the more fiber is lost.  Choose more nuts, seeds, legumes, dried peas, beans, and lentils.  Look for food products that have greater than 3 grams of fiber per serving on the Nutrition Facts label.  Take all medicine as directed by your caregiver.  If your caregiver has given you a follow-up appointment, it is very important that you go. Not going could result in lasting (chronic) or permanent injury, pain, and disability. If there  is any problem keeping the appointment, call to reschedule. SEEK MEDICAL CARE IF:   Your pain does not improve.  You have a hard time advancing your diet beyond clear liquids.  Your bowel movements do not return to normal. SEEK IMMEDIATE MEDICAL CARE IF:   Your pain becomes  worse.  You have an oral temperature above 102 F (38.9 C), not controlled by medicine.  You have repeated vomiting.  You have bloody or black, tarry stools.  Symptoms that brought you to your caregiver become worse or are not getting better. MAKE SURE YOU:   Understand these instructions.  Will watch your condition.  Will get help right away if you are not doing well or get worse. Document Released: 07/06/2005 Document Revised: 12/19/2011 Document Reviewed: 11/01/2010 [facility] Patient Information 2014 [employer].

## 2014-03-25 NOTE — ED Provider Notes (Signed)
Medical screening examination/treatment/procedure(s) were performed by non-physician practitioner and as supervising physician I was immediately available for consultation/collaboration.   EKG Interpretation None        Gwyneth SproutWhitney Marshella Tello, MD 03/25/14 [phone]

## 2014-04-09 ENCOUNTER — Encounter (INDEPENDENT_AMBULATORY_CARE_PROVIDER_SITE_OTHER): Payer: Self-pay | Admitting: General Surgery

## 2014-04-09 ENCOUNTER — Ambulatory Visit (INDEPENDENT_AMBULATORY_CARE_PROVIDER_SITE_OTHER): Payer: BC Managed Care – PPO | Admitting: General Surgery

## 2014-04-09 VITALS — BP 130/75 | HR 54 | Temp 97.3°F | Resp 16 | Ht 72.0 in | Wt 223.2 lb

## 2014-04-09 DIAGNOSIS — K5732 Diverticulitis of large intestine without perforation or abscess without bleeding: Secondary | ICD-10-CM | POA: Insufficient documentation

## 2014-04-09 MED ORDER — AMOXICILLIN-POT CLAVULANATE 875-125 MG PO TABS: 1.0000 | ORAL_TABLET | Freq: Two times a day (BID) | ORAL | Status: AC

## 2014-04-09 MED ORDER — METRONIDAZOLE 500 MG PO TABS: 500.0000 mg | ORAL_TABLET | Freq: Three times a day (TID) | ORAL | Status: AC

## 2014-04-09 NOTE — Patient Instructions (Signed)
Stay on antibiotics. Probiotic once a day. Cup of yogurt once a day.

## 2014-04-09 NOTE — Progress Notes (Signed)
Patient ID: [name] CampanileDonald S [name], male   DOB: 1966-05-08, 48 y.o.   MRN: [id number]  Chief Complaint  Patient presents with  . Diverticulitis    HPI [name] CampanileDonald S Peplinski is a 48 y.o. male.   HPI  He is referred by Dr. Bosie ClosSchooler for further evaluation and treatment of recurrent sigmoid diverticulitis.  He had his first episode in 2013. Colonoscopy at that time demonstrated diverticular disease from the mid transverse colon all the way down to the sigmoid colon. He was treated successfully with antibiotics. He had a recurrent episode on April. Each time he stopped his antibiotics he's had another recurrence within 2-3 weeks.  CT scan done 03/24/2014 demonstrated some inflammatory changes around the distal descending/proximal sigmoid colon.  He is currently asymptomatic. He has been off antibiotics for a couple of weeks.  He works as a [employer].  Past Medical History  Diagnosis Date  . Hypertension   . Sarcoidosis   . Hyperlipemia   . Depression   . Diverticulitis   . CVA (cerebral vascular accident)   . Allergy     SEASONAL  . Hypercholesteremia 06/15/2012    Past Surgical History  Procedure Laterality Date  . Vertebral artery stenting  2008    Family History  Problem Relation Age of Onset  . Emphysema Paternal Grandfather   . Heart disease Paternal Grandfather   . Allergies Mother   . Hypertension Mother   . Skin cancer Father   . Colon cancer Neg Hx     Social History History  Substance Use Topics  . Smoking status: Never Smoker   . Smokeless tobacco: Never Used  . Alcohol Use: Yes     Comment: seldom    Allergies  Allergen Reactions  . Celebrex [Celecoxib]     Mother is allergic, so I do not take it  . Sulfa Antibiotics Other (See Comments)    unknown  . Codeine Rash  . Dilaudid [Hydromorphone Hcl] Nausea And Vomiting and Anxiety    Current Outpatient Prescriptions  Medication Sig Dispense Refill  . aspirin 81 MG tablet Take 81 mg by mouth daily.      .  diphenhydrAMINE (BENADRYL) 25 MG tablet Take 25 mg by mouth every 6 (six) hours as needed for allergies.       . hyoscyamine (LEVSIN SL) 0.125 MG SL tablet Place 0.125 mg under the tongue every 6 (six) hours as needed for cramping.      Marland Kitchen. ibuprofen (ADVIL,MOTRIN) 200 MG tablet Take 400 mg by mouth every 6 (six) hours as needed (for pain). For pain      . losartan-hydrochlorothiazide (HYZAAR) 50-12.5 MG per tablet Take 0.5 tablets by mouth daily.      Marland Kitchen. oxyCODONE-acetaminophen (PERCOCET/ROXICET) 5-325 MG per tablet Take 2 tablets by mouth every 4 (four) hours as needed for severe pain.  15 tablet  0  . zolpidem (AMBIEN) 10 MG tablet Take 10 mg by mouth at bedtime as needed. For sleep      . amoxicillin-clavulanate (AUGMENTIN) 875-125 MG per tablet Take 1 tablet by mouth 2 (two) times daily.  42 tablet  3  . metroNIDAZOLE (FLAGYL) 500 MG tablet Take 1 tablet (500 mg total) by mouth 3 (three) times daily.  60 tablet  3   Current Facility-Administered Medications  Medication Dose Route Frequency Provider Last Rate Last Dose  . 0.9 %  sodium chloride infusion  500 mL Intravenous Continuous Hilarie FredricksonJohn N Perry, MD        Review of  Systems Review of Systems  Constitutional: Negative.   HENT: Negative.   Respiratory: Negative.   Cardiovascular: Negative.   Gastrointestinal: Positive for abdominal pain. Negative for nausea.  Genitourinary: Negative.   Neurological: Negative.   Hematological: Negative.     Blood pressure 130/75, pulse 54, temperature 97.3 F (36.3 C), resp. rate 16, height 6' (1.829 m), weight 223 lb 3.2 oz (101.243 kg).  Physical Exam Physical Exam  Constitutional: He appears well-developed and well-nourished. No distress.  HENT:  Head: Normocephalic and atraumatic.  Eyes: No scleral icterus.  Cardiovascular: Normal rate and regular rhythm.   Pulmonary/Chest: Effort normal and breath sounds normal.  Abdominal: Soft. He exhibits no distension and no mass. There is no tenderness.   Musculoskeletal: He exhibits no edema.  Lymphadenopathy:    He has no cervical adenopathy.  Neurological: He is alert.  Skin: Skin is warm and dry.  Psychiatric: He has a normal mood and affect. His behavior is normal.    Data Reviewed Notes from Dr. Marge DuncansSchooler's office.  Colonoscopy report from 2013.  CT scan.  Assessment    Recurrent sigmoid diverticulitis. Does have significant diverticulosis of other parts of his colon as well.     Plan    Restart Augmentin and Flagyl and keep on suppressive antibiotics until he can get a colonoscopy. I recommend he didn't have a partial colectomy as I feel he'll continue to have episodes of diverticulitis. He understands the plan. We'll see him back in about 6 weeks. We'll discuss him getting a colonoscopy with Dr. Bosie ClosSchooler.       Shailynn Fong J 04/09/2014, 12:40 PM

## 2014-05-02 ENCOUNTER — Other Ambulatory Visit (INDEPENDENT_AMBULATORY_CARE_PROVIDER_SITE_OTHER): Payer: Self-pay

## 2014-05-22 ENCOUNTER — Encounter (INDEPENDENT_AMBULATORY_CARE_PROVIDER_SITE_OTHER): Payer: BC Managed Care – PPO | Admitting: General Surgery

## 2014-06-02 ENCOUNTER — Encounter (INDEPENDENT_AMBULATORY_CARE_PROVIDER_SITE_OTHER): Payer: Self-pay | Admitting: General Surgery

## 2014-06-02 ENCOUNTER — Ambulatory Visit (INDEPENDENT_AMBULATORY_CARE_PROVIDER_SITE_OTHER): Payer: BC Managed Care – PPO | Admitting: General Surgery

## 2014-06-02 VITALS — BP 122/82 | HR 75 | Temp 97.4°F | Ht 72.0 in | Wt 225.0 lb

## 2014-06-02 DIAGNOSIS — K5732 Diverticulitis of large intestine without perforation or abscess without bleeding: Secondary | ICD-10-CM

## 2014-06-02 NOTE — Patient Instructions (Signed)
COLON BOWEL PREP                                                                          Please follow the instructions carefully. It is important to clean out your bowels & take the prescribed antibiotic pills to lower your chances of a wound infection or abscess.   FIVE DAYS PRIOR TO YOUR SURGERY  Stop eating any nuts, popcorn, or fruit with seeds. Stop all fiber supplements such as Metamucil, Citrucel, etc.   Hold taking any blood thinning anticoagulation medication (ex: aspirin, warfarin/Coumadin, Plavix, Xarelto, Eliquis, Pradaxa, etc) as recommended by your medical/cardiology doctor  Obtain what you need at a pharmacy of your choice:  -A bottle of MiraLax / Glycolax (288g) - no prescription required  -A large bottle of Gatorade / Powerade (64oz)  -Dulcolax tablets (4 tabs) - no prescription required    DAY PRIOR TO SURGERY   7:00am Swallow 4 Dulcolax tablets with some water Drink plenty of clear liquids all day to avoid getting dehydrated (Water, juice, soda, coffee, tea, bouillon, jello, etc.)  10:00am Mix the bottle of MiraLax with the 64-oz bottle of Gatorade.  Drink the Gatorade mixture gradually over the next few hours (8oz glass every 15-30 minutes) until gone. You should finish by 2p Do not eat or drink anything after bedtime (midnight) the night before your surgery.   MORNING OF SURGERY Remember to not to drink or eat anything that morning  Hold or take medications as recommended by the hospital staff at your Preoperative visit  If you have questions or concerns, please call [facility] [phone] during business hours to speak to the clinical staff for advice.

## 2014-06-02 NOTE — Progress Notes (Signed)
Patient ID: [name] [name], male   DOB: 25-Jul-1966, 48 y.o.   MRN: [id number]  Chief Complaint  Patient presents with  . Follow-up    HPI [name] [name] is a 48 y.o. male.   HPI  He is here for a follow up visit because of his recurrent sigmoid diverticulitis. Colonoscopy was done recently. No inflammatory changes were seen. Diverticula were seen in the distal descending colon and sigmoid colon.  He currently is pain-free. He is still on  Antibiotics. He has occasional diarrhea.  Past Medical History  Diagnosis Date  . Hypertension   . Sarcoidosis   . Hyperlipemia   . Depression   . Diverticulitis   . CVA (cerebral vascular accident)   . Allergy     SEASONAL  . Hypercholesteremia 06/15/2012    Past Surgical History  Procedure Laterality Date  . Vertebral artery stenting  2008    Family History  Problem Relation Age of Onset  . Emphysema Paternal Grandfather   . Heart disease Paternal Grandfather   . Allergies Mother   . Hypertension Mother   . Skin cancer Father   . Colon cancer Neg Hx     Social History History  Substance Use Topics  . Smoking status: Never Smoker   . Smokeless tobacco: Never Used  . Alcohol Use: Yes     Comment: seldom    Allergies  Allergen Reactions  . Celebrex [Celecoxib]     Mother is allergic, so I do not take it  . Sulfa Antibiotics Other (See Comments)    unknown  . Codeine Rash  . Dilaudid [Hydromorphone Hcl] Nausea And Vomiting and Anxiety    Current Outpatient Prescriptions  Medication Sig Dispense Refill  . aspirin 81 MG tablet Take 81 mg by mouth daily.      . diphenhydrAMINE (BENADRYL) 25 MG tablet Take 25 mg by mouth every 6 (six) hours as needed for allergies.       . hyoscyamine (LEVSIN SL) 0.125 MG SL tablet Place 0.125 mg under the tongue every 6 (six) hours as needed for cramping.      Marland Kitchen ibuprofen (ADVIL,MOTRIN) 200 MG tablet Take 400 mg by mouth every 6 (six) hours as needed (for pain). For pain      .  losartan-hydrochlorothiazide (HYZAAR) 50-12.5 MG per tablet Take 0.5 tablets by mouth daily.      Marland Kitchen zolpidem (AMBIEN) 10 MG tablet Take 10 mg by mouth at bedtime as needed. For sleep      . oxyCODONE-acetaminophen (PERCOCET/ROXICET) 5-325 MG per tablet Take 2 tablets by mouth every 4 (four) hours as needed for severe pain.  15 tablet  0   Current Facility-Administered Medications  Medication Dose Route Frequency Provider Last Rate Last Dose  . 0.9 %  sodium chloride infusion  500 mL Intravenous Continuous Hilarie Fredrickson, MD        Review of Systems Review of Systems  Constitutional: Negative for fever and chills.  Gastrointestinal: Positive for diarrhea. Negative for abdominal pain.    Blood pressure 122/82, pulse 75, temperature 97.4 F (36.3 C), height 6' (1.829 m), weight 225 lb (102.059 kg).  Physical Exam Physical Exam  Constitutional: No distress.  Obese.  HENT:  Head: Normocephalic and atraumatic.  Cardiovascular: Normal rate and regular rhythm.   Pulmonary/Chest: Effort normal and breath sounds normal.  Abdominal: Soft. He exhibits no distension and no mass. There is no tenderness.  Neurological: He is alert.  Skin: Skin is warm and dry.  Psychiatric: He has a normal mood and affect. His behavior is normal.    Data Reviewed Colonoscopy report  Assessment    Recurrent sigmoid diverticulitis. He is on suppressive antibiotics and has no symptoms. He is interested in having a partial colectomy.    Plan    Laparoscopic possible open partial colectomy.  I have explained the procedure and risks of colon resection.  Risks include but are not limited to bleeding, infection, wound problems, anesthesia, anastomotic leak, need for colostomy, need for reoperative surgery,  injury to intraabominal organs (such as intestine, spleen, kidney, bladder, ureter, etc.), ileus, irregular bowel habits.  He seems to understand and would like to proceed.        [name]  J 06/02/2014, 11:34 AM

## 2014-06-18 ENCOUNTER — Emergency Department (HOSPITAL_COMMUNITY)
Admission: EM | Admit: 2014-06-18 | Discharge: 2014-06-18 | Disposition: A | Discharge status: Home / Self Care | Payer: BC Managed Care – PPO | Attending: Emergency Medicine | Admitting: Emergency Medicine

## 2014-06-18 ENCOUNTER — Encounter (HOSPITAL_COMMUNITY): Payer: Self-pay | Admitting: Emergency Medicine

## 2014-06-18 DIAGNOSIS — Z8639 Personal history of other endocrine, nutritional and metabolic disease: Secondary | ICD-10-CM | POA: Diagnosis not present

## 2014-06-18 DIAGNOSIS — Z8719 Personal history of other diseases of the digestive system: Secondary | ICD-10-CM | POA: Insufficient documentation

## 2014-06-18 DIAGNOSIS — Z8659 Personal history of other mental and behavioral disorders: Secondary | ICD-10-CM | POA: Diagnosis not present

## 2014-06-18 DIAGNOSIS — Z862 Personal history of diseases of the blood and blood-forming organs and certain disorders involving the immune mechanism: Secondary | ICD-10-CM | POA: Insufficient documentation

## 2014-06-18 DIAGNOSIS — Z8619 Personal history of other infectious and parasitic diseases: Secondary | ICD-10-CM | POA: Diagnosis not present

## 2014-06-18 DIAGNOSIS — I10 Essential (primary) hypertension: Secondary | ICD-10-CM | POA: Diagnosis not present

## 2014-06-18 DIAGNOSIS — R638 Other symptoms and signs concerning food and fluid intake: Secondary | ICD-10-CM | POA: Insufficient documentation

## 2014-06-18 DIAGNOSIS — Z79899 Other long term (current) drug therapy: Secondary | ICD-10-CM | POA: Insufficient documentation

## 2014-06-18 DIAGNOSIS — R42 Dizziness and giddiness: Secondary | ICD-10-CM | POA: Diagnosis not present

## 2014-06-18 DIAGNOSIS — Z792 Long term (current) use of antibiotics: Secondary | ICD-10-CM | POA: Diagnosis not present

## 2014-06-18 DIAGNOSIS — R197 Diarrhea, unspecified: Secondary | ICD-10-CM | POA: Insufficient documentation

## 2014-06-18 DIAGNOSIS — Z8673 Personal history of transient ischemic attack (TIA), and cerebral infarction without residual deficits: Secondary | ICD-10-CM | POA: Insufficient documentation

## 2014-06-18 DIAGNOSIS — R110 Nausea: Secondary | ICD-10-CM | POA: Diagnosis not present

## 2014-06-18 DIAGNOSIS — R1084 Generalized abdominal pain: Secondary | ICD-10-CM | POA: Diagnosis not present

## 2014-06-18 DIAGNOSIS — R109 Unspecified abdominal pain: Secondary | ICD-10-CM | POA: Diagnosis present

## 2014-06-18 LAB — COMPREHENSIVE METABOLIC PANEL
ALBUMIN: 4 g/dL (ref 3.5–5.2)
ALT: 26 U/L (ref 0–53)
AST: 24 U/L (ref 0–37)
Alkaline Phosphatase: 66 U/L (ref 39–117)
Anion gap: 13 (ref 5–15)
BUN: 12 mg/dL (ref 6–23)
CHLORIDE: 100 meq/L (ref 96–112)
CO2: 23 mEq/L (ref 19–32)
CREATININE: 1.04 mg/dL (ref 0.50–1.35)
Calcium: 9.4 mg/dL (ref 8.4–10.5)
GFR calc Af Amer: >90 mL/min (ref >90)
GFR calc non Af Amer: 85 mL/min — ABNORMAL LOW (ref >90)
Glucose, Bld: 110 mg/dL — ABNORMAL HIGH (ref 70–99)
Potassium: 3.8 mEq/L (ref 3.7–5.3)
Sodium: 136 mEq/L — ABNORMAL LOW (ref 137–147)
Total Bilirubin: 0.5 mg/dL (ref 0.3–1.2)
Total Protein: 7.4 g/dL (ref 6.0–8.3)

## 2014-06-18 LAB — CBC WITH DIFFERENTIAL/PLATELET
BASOS PCT: 1 % (ref 0–1)
Basophils Absolute: 0.1 10*3/uL (ref 0.0–0.1)
EOS ABS: 0.3 10*3/uL (ref 0.0–0.7)
Eosinophils Relative: 5 % (ref 0–5)
HCT: 44.5 % (ref 39.0–52.0)
Hemoglobin: 15.5 g/dL (ref 13.0–17.0)
LYMPHS ABS: 1.3 10*3/uL (ref 0.7–4.0)
Lymphocytes Relative: 17 % (ref 12–46)
MCH: 30.8 pg (ref 26.0–34.0)
MCHC: 34.8 g/dL (ref 30.0–36.0)
MCV: 88.3 fL (ref 78.0–100.0)
Monocytes Absolute: 0.8 10*3/uL (ref 0.1–1.0)
Monocytes Relative: 10 % (ref 3–12)
Neutro Abs: 5.1 10*3/uL (ref 1.7–7.7)
Neutrophils Relative %: 67 % (ref 43–77)
PLATELETS: 329 10*3/uL (ref 150–400)
RBC: 5.04 MIL/uL (ref 4.22–5.81)
RDW: 13.9 % (ref 11.5–15.5)
WBC: 7.5 10*3/uL (ref 4.0–10.5)

## 2014-06-18 LAB — LIPASE, BLOOD: LIPASE: 20 U/L (ref 11–59)

## 2014-06-18 MED ORDER — ONDANSETRON HCL 4 MG/2ML IJ SOLN
4.0000 mg | Freq: Once | INTRAMUSCULAR | Status: AC
  Administered 2014-06-18: 4 mg via INTRAVENOUS
  Filled 2014-06-18: qty 2

## 2014-06-18 MED ORDER — METRONIDAZOLE 500 MG PO TABS: 500.0000 mg | ORAL_TABLET | Freq: Three times a day (TID) | ORAL | Status: DC

## 2014-06-18 MED ORDER — FENTANYL CITRATE 0.05 MG/ML IJ SOLN
75.0000 ug | Freq: Once | INTRAMUSCULAR | Status: AC
  Administered 2014-06-18: 75 ug via INTRAVENOUS
  Filled 2014-06-18: qty 2

## 2014-06-18 MED ORDER — SODIUM CHLORIDE 0.9 % IV BOLUS (SEPSIS)
500.0000 mL | Freq: Once | INTRAVENOUS | Status: AC
  Administered 2014-06-18: 500 mL via INTRAVENOUS

## 2014-06-18 MED ORDER — OXYCODONE-ACETAMINOPHEN 5-325 MG PO TABS: 1.0000 | ORAL_TABLET | ORAL | Status: DC | PRN

## 2014-06-18 MED ORDER — CIPROFLOXACIN HCL 500 MG PO TABS: 500.0000 mg | ORAL_TABLET | Freq: Two times a day (BID) | ORAL | Status: DC

## 2014-06-18 MED ORDER — ONDANSETRON 4 MG PO TBDP: ORAL_TABLET | ORAL | Status: DC

## 2014-06-18 MED ORDER — HYDROCODONE-ACETAMINOPHEN 5-325 MG PO TABS
2.0000 | ORAL_TABLET | ORAL | Status: DC | PRN
Start: 2014-06-18 — End: 2014-06-18

## 2014-06-18 NOTE — ED Notes (Signed)
Pt aware to make appt for surgery and to return to ED with worsening symptoms.

## 2014-06-18 NOTE — ED Notes (Signed)
Patient states he was awaken out of his sleep at 3 am with abdominal pain in the umbilicus. Patient states he has diverticulitis and have been taking antibiotic. Patient states last BM 06/18/2014. Patient states appetite has decrease, denies vomiting but has nausea.

## 2014-06-18 NOTE — ED Provider Notes (Signed)
CSN: [id number]     Arrival date & time 06/18/14  0818 History   First MD Initiated Contact with Patient 06/18/14 0827     Chief Complaint  Patient presents with  . Abdominal Pain     (Consider location/radiation/quality/duration/timing/severity/associated sxs/prior Treatment) HPI Comments: 48 year old male with history of recurrent sigmoid diverticulitis x3, occasional alcohol, nonsmoker presents with central abdominal discomfort gradually worsening for the past few weeks. Patient says that he has been on Augmentin twice daily for 4 weeks in preparation for surgery for recurrent diverticulitis by Dr. Purnell Shoemaker.  Patient has tried to followup outpatient however unable to obtain appointment. He is seeing gastroenterology and had recent colonoscopy showing healed diverticular disease with antibiotics and time. Patient says he is ready for surgery as he is very frustrated with recurrent pain and nausea. Patient says as soon as he comes off antibiotics he gets recurrence of his left lower quadrant pain however this pain is central with recurrent diarrhea. No C. difficile history.  Patient is a 48 y.o. male presenting with abdominal pain. The history is provided by the patient.  Abdominal Pain Associated symptoms: diarrhea and nausea   Associated symptoms: no chest pain, no chills, no dysuria, no fever, no shortness of breath and no vomiting     Past Medical History  Diagnosis Date  . Hypertension   . Sarcoidosis   . Hyperlipemia   . Depression   . Diverticulitis   . CVA (cerebral vascular accident)   . Allergy     SEASONAL  . Hypercholesteremia 06/15/2012   Past Surgical History  Procedure Laterality Date  . Vertebral artery stenting  2008   Family History  Problem Relation Age of Onset  . Emphysema Paternal Grandfather   . Heart disease Paternal Grandfather   . Allergies Mother   . Hypertension Mother   . Skin cancer Father   . Colon cancer Neg Hx    History  Substance Use  Topics  . Smoking status: Never Smoker   . Smokeless tobacco: Never Used  . Alcohol Use: Yes     Comment: seldom    Review of Systems  Constitutional: Positive for appetite change. Negative for fever and chills.  HENT: Negative for congestion.   Eyes: Negative for visual disturbance.  Respiratory: Negative for shortness of breath.   Cardiovascular: Negative for chest pain.  Gastrointestinal: Positive for nausea, abdominal pain and diarrhea. Negative for vomiting.  Genitourinary: Negative for dysuria and flank pain.  Musculoskeletal: Negative for back pain, neck pain and neck stiffness.  Skin: Negative for rash.  Neurological: Positive for light-headedness. Negative for headaches.      Allergies  Celebrex; Sulfa antibiotics; Codeine; and Dilaudid  Home Medications   Prior to Admission medications   Medication Sig Start Date End Date Taking? Authorizing Provider  amoxicillin-clavulanate (AUGMENTIN) 875-125 MG per tablet Take 1 tablet by mouth 2 (two) times daily.  05/06/14  Yes Historical Provider, MD  colchicine 0.6 MG tablet Take 0.6 mg by mouth 2 (two) times daily as needed (For gout.).  06/04/14  Yes Historical Provider, MD  diphenhydrAMINE (BENADRYL) 25 MG tablet Take 25 mg by mouth every 6 (six) hours as needed for allergies.    Yes Historical Provider, MD  hyoscyamine (LEVSIN SL) 0.125 MG SL tablet Place 0.125 mg under the tongue every 6 (six) hours as needed for cramping.   Yes Historical Provider, MD  losartan-hydrochlorothiazide (HYZAAR) 50-12.5 MG per tablet Take 0.5 tablets by mouth at bedtime.  11/18/13  Yes Historical  Provider, MD  oxyCODONE-acetaminophen (PERCOCET/ROXICET) 5-325 MG per tablet Take 2 tablets by mouth every 4 (four) hours as needed for severe pain. 03/24/14  Yes Jillyn Ledger, PA-C  Probiotic Product (PROBIOTIC DAILY PO) Take 1 capsule by mouth at bedtime.   Yes Historical Provider, MD  zolpidem (AMBIEN) 10 MG tablet Take 10 mg by mouth at bedtime as  needed for sleep.    Yes Historical Provider, MD   BP 125/78  Pulse 65  Temp(Src) 98.7 F (37.1 C) (Oral)  Resp 15  SpO2 100% Physical Exam  Nursing note and vitals reviewed. Constitutional: He is oriented to person, place, and time. He appears well-developed and well-nourished.  HENT:  Head: Normocephalic and atraumatic.  Eyes: Conjunctivae are normal. Right eye exhibits no discharge. Left eye exhibits no discharge.  Neck: Normal range of motion. Neck supple. No tracheal deviation present.  Cardiovascular: Normal rate and regular rhythm.   Pulmonary/Chest: Effort normal and breath sounds normal.  Abdominal: Soft. He exhibits no distension. There is tenderness (supraumbilical mild). There is no guarding.  Musculoskeletal: He exhibits no edema.  Neurological: He is alert and oriented to person, place, and time.  Skin: Skin is warm. No rash noted.  Psychiatric: He has a normal mood and affect.    ED Course  Procedures (including critical care time) Labs Review Labs Reviewed  COMPREHENSIVE METABOLIC PANEL - Abnormal; Notable for the following:    Sodium 136 (*)    Glucose, Bld 110 (*)    GFR calc non Af Amer 85 (*)    All other components within normal limits  CBC WITH DIFFERENTIAL  LIPASE, BLOOD  URINALYSIS, ROUTINE W REFLEX MICROSCOPIC    Imaging Review No results found.   EKG Interpretation None      MDM   Final diagnoses:  Generalized abdominal pain  Nausea   Clinical concern for gastritis/antibiotic side effects/C. difficile since patient has been on Rx for 4 weeks. Plan for basic blood work, pain meds and consult to general surgery.  Patient proves significantly on recheck. No significant pain. Discussed followup with on-call surgery nurse practitioner and schedule her notified to help arrange appointment in surgery. Plan to change to Cipro Flagyl.  Results and differential diagnosis were discussed with the patient/parent/guardian. Close follow up  outpatient was discussed, comfortable with the plan.   Medications  sodium chloride 0.9 % bolus 500 mL (500 mLs Intravenous New Bag/Given 06/18/14 0920)  fentaNYL (SUBLIMAZE) injection 75 mcg (75 mcg Intravenous Given 06/18/14 0916)  ondansetron (ZOFRAN) injection 4 mg (4 mg Intravenous Given 06/18/14 0907)    Filed Vitals:   06/18/14 0826  BP: 125/78  Pulse: 65  Temp: 98.7 F (37.1 C)  TempSrc: Oral  Resp: 15  SpO2: 100%       Enid Skeens, MD 06/18/14 1134

## 2014-06-18 NOTE — Discharge Instructions (Signed)
Call Debbie at [phone] to schedule surgery an appointment Take Zofran as needed for nausea. Stop taking Augmentin and start taking Cipro and Flagyl tomorrow. Avoid alcohol. If you were given medicines take as directed.  If you are on coumadin or contraceptives realize their levels and effectiveness is altered by many different medicines.  If you have any reaction (rash, tongues swelling, other) to the medicines stop taking and see a physician.   Please follow up as directed and return to the ER or see a physician for new or worsening symptoms.  Thank you. Filed Vitals:   06/18/14 0826  BP: 125/78  Pulse: 65  Temp: 98.7 F (37.1 C)  TempSrc: Oral  Resp: 15  SpO2: 100%

## 2014-06-18 NOTE — ED Notes (Signed)
Pt reports pain in abdomen starting at 2 am today.  Pt diagnosed with diverticulitis in Lower left quadrant.  Pt is on antibiotics currently.  Pt has no appetite and states his pain is getting worse.  Pt reports he "cant keep anything down." Pt states he has nausea without vomitting and diarrhea present.

## 2014-06-18 NOTE — Progress Notes (Signed)
Was called by Dr. Gardiner Rhyme because the patient is in the ER due to persistent discomfort due to his chronic diverticulitis.  He is being followed by Dr. Abbey Chatters and is not yet scheduled for surgery.  His labs are all normal.  He's been taking Augmentin and having a lot of diarrhea.  Dr. Gardiner Rhyme thinks his discomfort is from the long term antibiotics.  He's on a probiotic which I've discussed with Dr. Gardiner Rhyme to continue.  I have talked to Debbie our surgery scheduler in the office and she will call him today before she leaves or first thing in the AM regarding scheduling, but that they are about 2 weeks behind on scheduling.  I have recommended that they switch to cipro/flagyl combo instead of Augmentin for the propensity of c.diff.  He is pending C.diff. lab.  He has no criteria for admission and is fine with going home, just frustrated about not being able to get a hold of the office to schedule his elective sigmoid colon resection.    [name] [name], [facility]

## 2014-06-18 NOTE — Progress Notes (Signed)
Noted  

## 2014-06-18 NOTE — ED Notes (Signed)
Pt. Unable to urinate at this time. Nurses was notified.  Will collect urine specimen.

## 2014-06-20 ENCOUNTER — Encounter (HOSPITAL_COMMUNITY): Payer: Self-pay | Admitting: Pharmacy Technician

## 2014-06-25 ENCOUNTER — Encounter (HOSPITAL_COMMUNITY)
Admission: RE | Admit: 2014-06-25 | Discharge: 2014-06-25 | Disposition: A | Discharge status: Home / Self Care | Payer: BC Managed Care – PPO | Source: Ambulatory Visit | Attending: General Surgery | Admitting: General Surgery

## 2014-06-25 ENCOUNTER — Encounter (HOSPITAL_COMMUNITY): Payer: Self-pay

## 2014-06-25 DIAGNOSIS — Z01812 Encounter for preprocedural laboratory examination: Secondary | ICD-10-CM | POA: Insufficient documentation

## 2014-06-25 DIAGNOSIS — Z01810 Encounter for preprocedural cardiovascular examination: Secondary | ICD-10-CM | POA: Insufficient documentation

## 2014-06-25 DIAGNOSIS — K5732 Diverticulitis of large intestine without perforation or abscess without bleeding: Secondary | ICD-10-CM | POA: Diagnosis present

## 2014-06-25 LAB — CBC WITH DIFFERENTIAL/PLATELET
BASOS ABS: 0.1 10*3/uL (ref 0.0–0.1)
BASOS PCT: 1 % (ref 0–1)
Eosinophils Absolute: 0.3 10*3/uL (ref 0.0–0.7)
Eosinophils Relative: 4 % (ref 0–5)
HCT: 42.7 % (ref 39.0–52.0)
Hemoglobin: 15 g/dL (ref 13.0–17.0)
LYMPHS PCT: 24 % (ref 12–46)
Lymphs Abs: 2.1 10*3/uL (ref 0.7–4.0)
MCH: 31.2 pg (ref 26.0–34.0)
MCHC: 35.1 g/dL (ref 30.0–36.0)
MCV: 88.8 fL (ref 78.0–100.0)
Monocytes Absolute: 0.9 10*3/uL (ref 0.1–1.0)
Monocytes Relative: 10 % (ref 3–12)
NEUTROS ABS: 5.5 10*3/uL (ref 1.7–7.7)
Neutrophils Relative %: 61 % (ref 43–77)
PLATELETS: 320 10*3/uL (ref 150–400)
RBC: 4.81 MIL/uL (ref 4.22–5.81)
RDW: 13.5 % (ref 11.5–15.5)
WBC: 8.9 10*3/uL (ref 4.0–10.5)

## 2014-06-25 LAB — COMPREHENSIVE METABOLIC PANEL
ALBUMIN: 4.2 g/dL (ref 3.5–5.2)
ALT: 33 U/L (ref 0–53)
AST: 25 U/L (ref 0–37)
Alkaline Phosphatase: 69 U/L (ref 39–117)
Anion gap: 13 (ref 5–15)
BILIRUBIN TOTAL: 0.7 mg/dL (ref 0.3–1.2)
BUN: 12 mg/dL (ref 6–23)
CHLORIDE: 105 meq/L (ref 96–112)
CO2: 22 meq/L (ref 19–32)
Calcium: 9.7 mg/dL (ref 8.4–10.5)
Creatinine, Ser: 1.19 mg/dL (ref 0.50–1.35)
GFR calc Af Amer: 84 mL/min — ABNORMAL LOW (ref >90)
GFR, EST NON AFRICAN AMERICAN: 72 mL/min — AB (ref >90)
Glucose, Bld: 78 mg/dL (ref 70–99)
Potassium: 3.8 mEq/L (ref 3.7–5.3)
Sodium: 140 mEq/L (ref 137–147)
Total Protein: 7.6 g/dL (ref 6.0–8.3)

## 2014-06-25 LAB — PROTIME-INR
INR: 1.08 (ref 0.00–1.49)
Prothrombin Time: 14 seconds (ref 11.6–15.2)

## 2014-06-25 NOTE — Patient Instructions (Signed)
[name] [name]  06/25/2014   Your procedure is scheduled on:  07/01/2014    Report to [facility].  Follow the Signs to Short Stay Center at  0530      am  Call this number if you have problems the morning of surgery: [phone]   Remember:   Do not eat food or drink liquids after midnight.   Take these medicines the morning of surgery with A SIP OF WATER:    Do not wear jewelry,   Do not wear lotions, powders, or perfumes.  deodorant.   Men may shave face and neck.  Do not bring valuables to the hospital.  Contacts, dentures or bridgework may not be worn into surgery.  Leave suitcase in the car. After surgery it may be brought to your room.  For patients admitted to the hospital, checkout time is 11:00 AM the day of  discharge.          Please read over the following fact sheets that you were given: [facility] - Preparing for Surgery Before surgery, you can play an important role.  Because skin is not sterile, your skin needs to be as free of germs as possible.  You can reduce the number of germs on your skin by washing with CHG (chlorahexidine gluconate) soap before surgery.  CHG is an antiseptic cleaner which kills germs and bonds with the skin to continue killing germs even after washing. Please DO NOT use if you have an allergy to CHG or antibacterial soaps.  If your skin becomes reddened/irritated stop using the CHG and inform your nurse when you arrive at Short Stay. Do not shave (including legs and underarms) for at least 48 hours prior to the first CHG shower.  You may shave your face/neck. Please follow these instructions carefully:  1.  Shower with CHG Soap the night before surgery and the  morning of Surgery.  2.  If you choose to wash your hair, wash your hair first as usual with your  normal  shampoo.  3.  After you shampoo, rinse your hair and body thoroughly to remove the  shampoo.                           4.  Use CHG as you would any other liquid soap.   You can apply chg directly  to the skin and wash                       Gently with a scrungie or clean washcloth.  5.  Apply the CHG Soap to your body ONLY FROM THE NECK DOWN.   Do not use on face/ open                           Wound or open sores. Avoid contact with eyes, ears mouth and genitals (private parts).                       Wash face,  Genitals (private parts) with your normal soap.             6.  Wash thoroughly, paying special attention to the area where your surgery  will be performed.  7.  Thoroughly rinse your body with warm water from the neck down.  8.  DO NOT shower/wash with your normal soap after using and rinsing  off  the CHG Soap.                9.  Pat yourself dry with a clean towel.            10.  Wear clean pajamas.            11.  Place clean sheets on your bed the night of your first shower and do not  sleep with pets. Day of Surgery : Do not apply any lotions/deodorants the morning of surgery.  Please wear clean clothes to the hospital/surgery center.  FAILURE TO FOLLOW THESE INSTRUCTIONS MAY RESULT IN THE CANCELLATION OF YOUR SURGERY PATIENT SIGNATURE_________________________________  NURSE SIGNATURE__________________________________  ________________________________________________________________________  WHAT IS A BLOOD TRANSFUSION? Blood Transfusion Information  A transfusion is the replacement of blood or some of its parts. Blood is made up of multiple cells which provide different functions.  Red blood cells carry oxygen and are used for blood loss replacement.  White blood cells fight against infection.  Platelets control bleeding.  Plasma helps clot blood.  Other blood products are available for specialized needs, such as hemophilia or other clotting disorders. BEFORE THE TRANSFUSION  Who gives blood for transfusions?   Healthy volunteers who are fully evaluated to make sure their blood is safe. This is blood bank blood. Transfusion  therapy is the safest it has ever been in the practice of medicine. Before blood is taken from a donor, a complete history is taken to make sure that person has no history of diseases nor engages in risky social behavior (examples are intravenous drug use or sexual activity with multiple partners). The donor's travel history is screened to minimize risk of transmitting infections, such as malaria. The donated blood is tested for signs of infectious diseases, such as HIV and hepatitis. The blood is then tested to be sure it is compatible with you in order to minimize the chance of a transfusion reaction. If you or a relative donates blood, this is often done in anticipation of surgery and is not appropriate for emergency situations. It takes many days to process the donated blood. RISKS AND COMPLICATIONS Although transfusion therapy is very safe and saves many lives, the main dangers of transfusion include:   Getting an infectious disease.  Developing a transfusion reaction. This is an allergic reaction to something in the blood you were given. Every precaution is taken to prevent this. The decision to have a blood transfusion has been considered carefully by your caregiver before blood is given. Blood is not given unless the benefits outweigh the risks. AFTER THE TRANSFUSION  Right after receiving a blood transfusion, you will usually feel much better and more energetic. This is especially true if your red blood cells have gotten low (anemic). The transfusion raises the level of the red blood cells which carry oxygen, and this usually causes an energy increase.  The nurse administering the transfusion will monitor you carefully for complications. HOME CARE INSTRUCTIONS  No special instructions are needed after a transfusion. You may find your energy is better. Speak with your caregiver about any limitations on activity for underlying diseases you may have. SEEK MEDICAL CARE IF:   Your condition is  not improving after your transfusion.  You develop redness or irritation at the intravenous (IV) site. SEEK IMMEDIATE MEDICAL CARE IF:  Any of the following symptoms occur over the next 12 hours:  Shaking chills.  You have a temperature by mouth above 102 F (38.9 C), not  controlled by medicine.  Chest, back, or muscle pain.  People around you feel you are not acting correctly or are confused.  Shortness of breath or difficulty breathing.  Dizziness and fainting.  You get a rash or develop hives.  You have a decrease in urine output.  Your urine turns a dark color or changes to pink, red, or brown. Any of the following symptoms occur over the next 10 days:  You have a temperature by mouth above 102 F (38.9 C), not controlled by medicine.  Shortness of breath.  Weakness after normal activity.  The white part of the eye turns yellow (jaundice).  You have a decrease in the amount of urine or are urinating less often.  Your urine turns a dark color or changes to pink, red, or brown. Document Released: 09/23/2000 Document Revised: 12/19/2011 Document Reviewed: 05/12/2008 ExitCare Patient Information 2014 ExitCare, [place].  _______________________________________________________________________, coughing and deep breathing exercises, leg exercises

## 2014-06-26 NOTE — Progress Notes (Signed)
CT of abd/pelvis discusses lungs- 03/2014.   EKG - 06/25/2014 EPIC

## 2014-06-30 NOTE — Anesthesia Preprocedure Evaluation (Addendum)
Anesthesia Evaluation  Patient identified by MRN, date of birth, ID band Patient awake    Reviewed: Allergy & Precautions, H&P , NPO status , Patient's Chart, lab work & pertinent test results  History of Anesthesia Complications Negative for: history of anesthetic complications  Airway Mallampati: II TM Distance: >3 FB Neck ROM: Full    Dental no notable dental hx.    Pulmonary neg pulmonary ROS,  breath sounds clear to auscultation  Pulmonary exam normal       Cardiovascular Exercise Tolerance: Good hypertension, Pt. 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 CVA negative psych ROS   GI/Hepatic negative GI ROS, Neg liver ROS,   Endo/Other  negative endocrine ROS  Renal/GU negative Renal ROS  negative genitourinary   Musculoskeletal negative musculoskeletal ROS (+)   Abdominal   Peds negative pediatric ROS (+)  Hematology negative hematology ROS (+)   Anesthesia Other Findings   Reproductive/Obstetrics negative OB ROS                          Anesthesia Physical Anesthesia Plan  ASA: III  Anesthesia Plan: General   Post-op Pain Management:    Induction: Intravenous  Airway Management Planned: Oral ETT  Additional Equipment:   Intra-op Plan:   Post-operative Plan: Extubation in OR  Informed Consent: I have reviewed the patients History and Physical, chart, labs and discussed the procedure including the risks, benefits and alternatives for the proposed anesthesia with the patient or authorized representative who has indicated his/her understanding and acceptance.   Dental advisory given  Plan Discussed with: CRNA  Anesthesia Plan Comments:         Anesthesia Quick Evaluation

## 2014-07-01 ENCOUNTER — Encounter (HOSPITAL_COMMUNITY): Payer: BC Managed Care – PPO | Admitting: Anesthesiology

## 2014-07-01 ENCOUNTER — Inpatient Hospital Stay (HOSPITAL_COMMUNITY)
Admission: RE | Admit: 2014-07-01 | Discharge: 2014-07-28 | DRG: 329 | Disposition: A | Discharge status: Home Health | Payer: BC Managed Care – PPO | Source: Ambulatory Visit | Attending: General Surgery | Admitting: General Surgery

## 2014-07-01 ENCOUNTER — Encounter (HOSPITAL_COMMUNITY): Payer: Self-pay | Admitting: General Practice

## 2014-07-01 ENCOUNTER — Ambulatory Visit (HOSPITAL_COMMUNITY): Payer: BC Managed Care – PPO | Admitting: Anesthesiology

## 2014-07-01 ENCOUNTER — Encounter (HOSPITAL_COMMUNITY)
Admission: RE | Disposition: A | Discharge status: Home Health | Payer: Self-pay | Source: Ambulatory Visit | Attending: General Surgery

## 2014-07-01 DIAGNOSIS — T8149XA Infection following a procedure, other surgical site, initial encounter: Secondary | ICD-10-CM

## 2014-07-01 DIAGNOSIS — Z7982 Long term (current) use of aspirin: Secondary | ICD-10-CM | POA: Diagnosis not present

## 2014-07-01 DIAGNOSIS — R112 Nausea with vomiting, unspecified: Secondary | ICD-10-CM

## 2014-07-01 DIAGNOSIS — K651 Peritoneal abscess: Secondary | ICD-10-CM | POA: Diagnosis not present

## 2014-07-01 DIAGNOSIS — Z885 Allergy status to narcotic agent status: Secondary | ICD-10-CM | POA: Diagnosis not present

## 2014-07-01 DIAGNOSIS — E669 Obesity, unspecified: Secondary | ICD-10-CM | POA: Diagnosis present

## 2014-07-01 DIAGNOSIS — Y839 Surgical procedure, unspecified as the cause of abnormal reaction of the patient, or of later complication, without mention of misadventure at the time of the procedure: Secondary | ICD-10-CM | POA: Diagnosis not present

## 2014-07-01 DIAGNOSIS — E440 Moderate protein-calorie malnutrition: Secondary | ICD-10-CM | POA: Diagnosis present

## 2014-07-01 DIAGNOSIS — Z9109 Other allergy status, other than to drugs and biological substances: Secondary | ICD-10-CM | POA: Diagnosis not present

## 2014-07-01 DIAGNOSIS — K859 Acute pancreatitis without necrosis or infection, unspecified: Secondary | ICD-10-CM | POA: Diagnosis not present

## 2014-07-01 DIAGNOSIS — Z8673 Personal history of transient ischemic attack (TIA), and cerebral infarction without residual deficits: Secondary | ICD-10-CM

## 2014-07-01 DIAGNOSIS — D869 Sarcoidosis, unspecified: Secondary | ICD-10-CM | POA: Diagnosis not present

## 2014-07-01 DIAGNOSIS — K5750 Diverticulosis of both small and large intestine without perforation or abscess without bleeding: Secondary | ICD-10-CM

## 2014-07-01 DIAGNOSIS — R188 Other ascites: Secondary | ICD-10-CM | POA: Diagnosis present

## 2014-07-01 DIAGNOSIS — E785 Hyperlipidemia, unspecified: Secondary | ICD-10-CM | POA: Diagnosis present

## 2014-07-01 DIAGNOSIS — Z808 Family history of malignant neoplasm of other organs or systems: Secondary | ICD-10-CM

## 2014-07-01 DIAGNOSIS — I809 Phlebitis and thrombophlebitis of unspecified site: Secondary | ICD-10-CM

## 2014-07-01 DIAGNOSIS — Z791 Long term (current) use of non-steroidal anti-inflammatories (NSAID): Secondary | ICD-10-CM

## 2014-07-01 DIAGNOSIS — B3749 Other urogenital candidiasis: Secondary | ICD-10-CM | POA: Diagnosis not present

## 2014-07-01 DIAGNOSIS — Z825 Family history of asthma and other chronic lower respiratory diseases: Secondary | ICD-10-CM | POA: Diagnosis not present

## 2014-07-01 DIAGNOSIS — I8290 Acute embolism and thrombosis of unspecified vein: Secondary | ICD-10-CM | POA: Diagnosis not present

## 2014-07-01 DIAGNOSIS — B9620 Unspecified Escherichia coli [E. coli] as the cause of diseases classified elsewhere: Secondary | ICD-10-CM | POA: Diagnosis not present

## 2014-07-01 DIAGNOSIS — R1114 Bilious vomiting: Secondary | ICD-10-CM

## 2014-07-01 DIAGNOSIS — K632 Fistula of intestine: Secondary | ICD-10-CM

## 2014-07-01 DIAGNOSIS — Z8249 Family history of ischemic heart disease and other diseases of the circulatory system: Secondary | ICD-10-CM

## 2014-07-01 DIAGNOSIS — Z792 Long term (current) use of antibiotics: Secondary | ICD-10-CM

## 2014-07-01 DIAGNOSIS — D6859 Other primary thrombophilia: Secondary | ICD-10-CM

## 2014-07-01 DIAGNOSIS — J9811 Atelectasis: Secondary | ICD-10-CM | POA: Diagnosis not present

## 2014-07-01 DIAGNOSIS — Z6830 Body mass index (BMI) 30.0-30.9, adult: Secondary | ICD-10-CM

## 2014-07-01 DIAGNOSIS — T8143XA Infection following a procedure, organ and space surgical site, initial encounter: Secondary | ICD-10-CM | POA: Diagnosis not present

## 2014-07-01 DIAGNOSIS — Z113 Encounter for screening for infections with a predominantly sexual mode of transmission: Secondary | ICD-10-CM

## 2014-07-01 DIAGNOSIS — D62 Acute posthemorrhagic anemia: Secondary | ICD-10-CM | POA: Diagnosis not present

## 2014-07-01 DIAGNOSIS — K5730 Diverticulosis of large intestine without perforation or abscess without bleeding: Secondary | ICD-10-CM

## 2014-07-01 DIAGNOSIS — N179 Acute kidney failure, unspecified: Secondary | ICD-10-CM | POA: Diagnosis not present

## 2014-07-01 DIAGNOSIS — K5732 Diverticulitis of large intestine without perforation or abscess without bleeding: Secondary | ICD-10-CM | POA: Diagnosis not present

## 2014-07-01 DIAGNOSIS — I82619 Acute embolism and thrombosis of superficial veins of unspecified upper extremity: Secondary | ICD-10-CM | POA: Diagnosis present

## 2014-07-01 DIAGNOSIS — T814XXD Infection following a procedure, subsequent encounter: Secondary | ICD-10-CM

## 2014-07-01 DIAGNOSIS — K913 Postprocedural intestinal obstruction: Secondary | ICD-10-CM | POA: Diagnosis not present

## 2014-07-01 DIAGNOSIS — D72829 Elevated white blood cell count, unspecified: Secondary | ICD-10-CM

## 2014-07-01 DIAGNOSIS — T814XXA Infection following a procedure, initial encounter: Secondary | ICD-10-CM | POA: Diagnosis not present

## 2014-07-01 DIAGNOSIS — E869 Volume depletion, unspecified: Secondary | ICD-10-CM | POA: Diagnosis not present

## 2014-07-01 DIAGNOSIS — Z79891 Long term (current) use of opiate analgesic: Secondary | ICD-10-CM

## 2014-07-01 DIAGNOSIS — I10 Essential (primary) hypertension: Secondary | ICD-10-CM | POA: Diagnosis present

## 2014-07-01 DIAGNOSIS — I82611 Acute embolism and thrombosis of superficial veins of right upper extremity: Secondary | ICD-10-CM

## 2014-07-01 DIAGNOSIS — Z881 Allergy status to other antibiotic agents status: Secondary | ICD-10-CM

## 2014-07-01 DIAGNOSIS — IMO0002 Reserved for concepts with insufficient information to code with codable children: Secondary | ICD-10-CM

## 2014-07-01 DIAGNOSIS — K5740 Diverticulitis of both small and large intestine with perforation and abscess without bleeding: Secondary | ICD-10-CM

## 2014-07-01 DIAGNOSIS — Z79899 Other long term (current) drug therapy: Secondary | ICD-10-CM | POA: Diagnosis not present

## 2014-07-01 DIAGNOSIS — A499 Bacterial infection, unspecified: Secondary | ICD-10-CM

## 2014-07-01 DIAGNOSIS — Z1612 Extended spectrum beta lactamase (ESBL) resistance: Secondary | ICD-10-CM | POA: Diagnosis not present

## 2014-07-01 DIAGNOSIS — IMO0001 Reserved for inherently not codable concepts without codable children: Secondary | ICD-10-CM

## 2014-07-01 DIAGNOSIS — Z8679 Personal history of other diseases of the circulatory system: Secondary | ICD-10-CM | POA: Diagnosis present

## 2014-07-01 HISTORY — PX: OMENTECTOMY: SHX5985

## 2014-07-01 HISTORY — PX: LAPAROSCOPIC PARTIAL COLECTOMY: SHX5907

## 2014-07-01 SURGERY — LAPAROSCOPIC PARTIAL COLECTOMY
Anesthesia: General

## 2014-07-01 MED ORDER — SODIUM CHLORIDE 0.9 % IJ SOLN: 9.0000 mL | INTRAMUSCULAR | Status: DC | PRN

## 2014-07-01 MED ORDER — PROPOFOL 10 MG/ML IV BOLUS
INTRAVENOUS | Status: DC | PRN
  Administered 2014-07-01: 200 mg via INTRAVENOUS
  Administered 2014-07-01: 40 mg via INTRAVENOUS
  Administered 2014-07-01: 20 mg via INTRAVENOUS

## 2014-07-01 MED ORDER — CEFOTETAN DISODIUM-DEXTROSE 2-2.08 GM-% IV SOLR
2.0000 g | Freq: Two times a day (BID) | INTRAVENOUS | Status: DC
  Administered 2014-07-01: 2 g via INTRAVENOUS

## 2014-07-01 MED ORDER — SUFENTANIL CITRATE 50 MCG/ML IV SOLN
INTRAVENOUS | Status: DC | PRN
  Administered 2014-07-01 (×2): 5 ug via INTRAVENOUS
  Administered 2014-07-01 (×2): 10 ug via INTRAVENOUS
  Administered 2014-07-01 (×2): 5 ug via INTRAVENOUS
  Administered 2014-07-01 (×7): 10 ug via INTRAVENOUS

## 2014-07-01 MED ORDER — CETYLPYRIDINIUM CHLORIDE 0.05 % MT LIQD
7.0000 mL | Freq: Two times a day (BID) | OROMUCOSAL | Status: DC
  Administered 2014-07-02 – 2014-07-05 (×7): 7 mL via OROMUCOSAL

## 2014-07-01 MED ORDER — KETAMINE HCL 10 MG/ML IJ SOLN
INTRAMUSCULAR | Status: AC
  Filled 2014-07-01: qty 1

## 2014-07-01 MED ORDER — DIPHENHYDRAMINE HCL 12.5 MG/5ML PO ELIX: 12.5000 mg | ORAL_SOLUTION | Freq: Four times a day (QID) | ORAL | Status: DC | PRN

## 2014-07-01 MED ORDER — SUCCINYLCHOLINE CHLORIDE 20 MG/ML IJ SOLN
INTRAMUSCULAR | Status: DC | PRN
  Administered 2014-07-01: 100 mg via INTRAVENOUS

## 2014-07-01 MED ORDER — PROPOFOL 10 MG/ML IV BOLUS
INTRAVENOUS | Status: AC
  Filled 2014-07-01: qty 20

## 2014-07-01 MED ORDER — KETAMINE HCL 10 MG/ML IJ SOLN
INTRAMUSCULAR | Status: DC | PRN
  Administered 2014-07-01 (×2): 20 mg via INTRAVENOUS
  Administered 2014-07-01: 30 mg via INTRAVENOUS

## 2014-07-01 MED ORDER — PHENYLEPHRINE HCL 10 MG/ML IJ SOLN
INTRAMUSCULAR | Status: DC | PRN
  Administered 2014-07-01: 80 ug via INTRAVENOUS
  Administered 2014-07-01 (×4): 40 ug via INTRAVENOUS

## 2014-07-01 MED ORDER — BUPIVACAINE HCL (PF) 0.5 % IJ SOLN
INTRAMUSCULAR | Status: AC
  Filled 2014-07-01: qty 30

## 2014-07-01 MED ORDER — LIDOCAINE HCL (CARDIAC) 20 MG/ML IV SOLN
INTRAVENOUS | Status: AC
  Filled 2014-07-01: qty 5

## 2014-07-01 MED ORDER — ONDANSETRON HCL 4 MG/2ML IJ SOLN
4.0000 mg | Freq: Four times a day (QID) | INTRAMUSCULAR | Status: DC | PRN
  Filled 2014-07-01 (×2): qty 2

## 2014-07-01 MED ORDER — SUFENTANIL CITRATE 50 MCG/ML IV SOLN
INTRAVENOUS | Status: AC
  Filled 2014-07-01: qty 1

## 2014-07-01 MED ORDER — GLYCOPYRROLATE 0.2 MG/ML IJ SOLN
INTRAMUSCULAR | Status: AC
Start: 2014-07-01 — End: 2014-07-01
  Filled 2014-07-01: qty 3

## 2014-07-01 MED ORDER — ROCURONIUM BROMIDE 100 MG/10ML IV SOLN
INTRAVENOUS | Status: DC | PRN
  Administered 2014-07-01: 10 mg via INTRAVENOUS
  Administered 2014-07-01: 5 mg via INTRAVENOUS
  Administered 2014-07-01: 20 mg via INTRAVENOUS
  Administered 2014-07-01: 10 mg via INTRAVENOUS
  Administered 2014-07-01 (×3): 20 mg via INTRAVENOUS
  Administered 2014-07-01: 10 mg via INTRAVENOUS
  Administered 2014-07-01: 45 mg via INTRAVENOUS

## 2014-07-01 MED ORDER — LACTATED RINGERS IV SOLN
INTRAVENOUS | Status: DC | PRN
  Administered 2014-07-01: 3000 mL via INTRAVENOUS

## 2014-07-01 MED ORDER — DIPHENHYDRAMINE HCL 50 MG/ML IJ SOLN
12.5000 mg | Freq: Four times a day (QID) | INTRAMUSCULAR | Status: DC | PRN
Start: 2014-07-01 — End: 2014-07-07

## 2014-07-01 MED ORDER — LIDOCAINE HCL (CARDIAC) 20 MG/ML IV SOLN
INTRAVENOUS | Status: DC | PRN
  Administered 2014-07-01: 75 mg via INTRAVENOUS
  Administered 2014-07-01: 25 mg via INTRATRACHEAL

## 2014-07-01 MED ORDER — ROCURONIUM BROMIDE 100 MG/10ML IV SOLN
INTRAVENOUS | Status: AC
  Filled 2014-07-01: qty 1

## 2014-07-01 MED ORDER — ACETAMINOPHEN 10 MG/ML IV SOLN
1000.0000 mg | Freq: Once | INTRAVENOUS | Status: AC
  Administered 2014-07-01: 1000 mg via INTRAVENOUS
  Filled 2014-07-01: qty 100

## 2014-07-01 MED ORDER — NALOXONE HCL 0.4 MG/ML IJ SOLN
0.4000 mg | INTRAMUSCULAR | Status: DC | PRN
Start: 2014-07-01 — End: 2014-07-07

## 2014-07-01 MED ORDER — ALVIMOPAN 12 MG PO CAPS
12.0000 mg | ORAL_CAPSULE | Freq: Two times a day (BID) | ORAL | Status: DC
  Administered 2014-07-02 – 2014-07-06 (×9): 12 mg via ORAL
  Filled 2014-07-01 (×11): qty 1

## 2014-07-01 MED ORDER — SODIUM CHLORIDE 0.9 % IJ SOLN
INTRAMUSCULAR | Status: AC
  Filled 2014-07-01: qty 10

## 2014-07-01 MED ORDER — GLYCOPYRROLATE 0.2 MG/ML IJ SOLN
INTRAMUSCULAR | Status: AC
  Filled 2014-07-01: qty 1

## 2014-07-01 MED ORDER — CEFOTETAN DISODIUM-DEXTROSE 2-2.08 GM-% IV SOLR
INTRAVENOUS | Status: AC
  Filled 2014-07-01: qty 50

## 2014-07-01 MED ORDER — ONDANSETRON HCL 4 MG PO TABS
4.0000 mg | ORAL_TABLET | Freq: Four times a day (QID) | ORAL | Status: DC | PRN
  Administered 2014-07-27: 4 mg via ORAL
  Filled 2014-07-01: qty 1

## 2014-07-01 MED ORDER — ONDANSETRON HCL 4 MG/2ML IJ SOLN
INTRAMUSCULAR | Status: AC
  Filled 2014-07-01: qty 2

## 2014-07-01 MED ORDER — 0.9 % SODIUM CHLORIDE (POUR BTL) OPTIME
TOPICAL | Status: DC | PRN
  Administered 2014-07-01: 12000 mL

## 2014-07-01 MED ORDER — MORPHINE SULFATE 10 MG/ML IJ SOLN
INTRAMUSCULAR | Status: AC
  Filled 2014-07-01: qty 1

## 2014-07-01 MED ORDER — PANTOPRAZOLE SODIUM 40 MG IV SOLR
40.0000 mg | Freq: Every day | INTRAVENOUS | Status: DC
  Administered 2014-07-02 – 2014-07-05 (×5): 40 mg via INTRAVENOUS
  Filled 2014-07-01 (×6): qty 40

## 2014-07-01 MED ORDER — LACTATED RINGERS IV SOLN
INTRAVENOUS | Status: DC | PRN
  Administered 2014-07-01 (×6): via INTRAVENOUS

## 2014-07-01 MED ORDER — ONDANSETRON HCL 4 MG/2ML IJ SOLN
INTRAMUSCULAR | Status: DC | PRN
  Administered 2014-07-01: 4 mg via INTRAVENOUS

## 2014-07-01 MED ORDER — DEXTROSE IN LACTATED RINGERS 5 % IV SOLN
INTRAVENOUS | Status: DC
Start: 2014-07-01 — End: 2014-07-07
  Administered 2014-07-01 – 2014-07-02 (×2): via INTRAVENOUS
  Administered 2014-07-02: 125 mL/h via INTRAVENOUS
  Administered 2014-07-03: 03:00:00 via INTRAVENOUS
  Administered 2014-07-03: 100 mL/h via INTRAVENOUS
  Administered 2014-07-03 – 2014-07-05 (×6): via INTRAVENOUS

## 2014-07-01 MED ORDER — BUPIVACAINE HCL (PF) 0.5 % IJ SOLN
INTRAMUSCULAR | Status: DC | PRN
  Administered 2014-07-01: 15 mL

## 2014-07-01 MED ORDER — DEXAMETHASONE SODIUM PHOSPHATE 10 MG/ML IJ SOLN
INTRAMUSCULAR | Status: DC | PRN
  Administered 2014-07-01: 10 mg via INTRAVENOUS

## 2014-07-01 MED ORDER — MORPHINE SULFATE (PF) 1 MG/ML IV SOLN
INTRAVENOUS | Status: AC
  Filled 2014-07-01: qty 25

## 2014-07-01 MED ORDER — SODIUM CHLORIDE 0.9 % IJ SOLN
INTRAMUSCULAR | Status: AC
Start: 2014-07-01 — End: 2014-07-01
  Filled 2014-07-01: qty 10

## 2014-07-01 MED ORDER — ONDANSETRON HCL 4 MG/2ML IJ SOLN
4.0000 mg | INTRAMUSCULAR | Status: DC | PRN
  Administered 2014-07-03 – 2014-07-21 (×29): 4 mg via INTRAVENOUS
  Filled 2014-07-01 (×30): qty 2

## 2014-07-01 MED ORDER — DEXAMETHASONE SODIUM PHOSPHATE 10 MG/ML IJ SOLN
INTRAMUSCULAR | Status: AC
  Filled 2014-07-01: qty 1

## 2014-07-01 MED ORDER — ONDANSETRON HCL 4 MG/2ML IJ SOLN: 4.0000 mg | Freq: Once | INTRAMUSCULAR | Status: DC | PRN

## 2014-07-01 MED ORDER — DEXTROSE 5 % IV SOLN
2.0000 g | Freq: Two times a day (BID) | INTRAVENOUS | Status: AC
  Administered 2014-07-01: 2 g via INTRAVENOUS
  Filled 2014-07-01: qty 2

## 2014-07-01 MED ORDER — MIDAZOLAM HCL 5 MG/5ML IJ SOLN
INTRAMUSCULAR | Status: DC | PRN
  Administered 2014-07-01: 2 mg via INTRAVENOUS

## 2014-07-01 MED ORDER — NEOSTIGMINE METHYLSULFATE 10 MG/10ML IV SOLN
INTRAVENOUS | Status: DC | PRN
  Administered 2014-07-01: 5 mg via INTRAVENOUS

## 2014-07-01 MED ORDER — MORPHINE SULFATE 10 MG/ML IJ SOLN
1.0000 mg | INTRAMUSCULAR | Status: DC | PRN
  Administered 2014-07-01 (×3): 2 mg via INTRAVENOUS

## 2014-07-01 MED ORDER — ALVIMOPAN 12 MG PO CAPS
12.0000 mg | ORAL_CAPSULE | Freq: Once | ORAL | Status: AC
Start: 2014-07-01 — End: 2014-07-01
  Administered 2014-07-01: 12 mg via ORAL
  Filled 2014-07-01: qty 1

## 2014-07-01 MED ORDER — GLYCOPYRROLATE 0.2 MG/ML IJ SOLN
INTRAMUSCULAR | Status: DC | PRN
  Administered 2014-07-01: 0.2 mg via INTRAVENOUS
  Administered 2014-07-01: .8 mg via INTRAVENOUS

## 2014-07-01 MED ORDER — SODIUM CHLORIDE 0.9 % IV SOLN
INTRAVENOUS | Status: DC | PRN
  Administered 2014-07-01: 08:00:00 via INTRAVENOUS

## 2014-07-01 MED ORDER — MEPERIDINE HCL 50 MG/ML IJ SOLN: 6.2500 mg | INTRAMUSCULAR | Status: DC | PRN

## 2014-07-01 MED ORDER — PHENYLEPHRINE 40 MCG/ML (10ML) SYRINGE FOR IV PUSH (FOR BLOOD PRESSURE SUPPORT)
PREFILLED_SYRINGE | INTRAVENOUS | Status: AC
  Filled 2014-07-01: qty 10

## 2014-07-01 MED ORDER — MORPHINE SULFATE 10 MG/ML IJ SOLN
1.0000 mg | INTRAMUSCULAR | Status: DC | PRN
  Administered 2014-07-01 (×5): 2 mg via INTRAVENOUS

## 2014-07-01 MED ORDER — MORPHINE SULFATE (PF) 1 MG/ML IV SOLN
INTRAVENOUS | Status: DC
  Administered 2014-07-01: 4.5 mg via INTRAVENOUS
  Administered 2014-07-01: 6 mg via INTRAVENOUS
  Administered 2014-07-01: 12 mg via INTRAVENOUS
  Administered 2014-07-01 (×2): via INTRAVENOUS
  Administered 2014-07-02: 12.8 mg via INTRAVENOUS
  Administered 2014-07-02: 4.5 mg via INTRAVENOUS
  Administered 2014-07-02: 10.5 mg via INTRAVENOUS
  Administered 2014-07-02: 04:00:00 via INTRAVENOUS
  Administered 2014-07-02: 5.91 mg via INTRAVENOUS
  Administered 2014-07-02: 4.5 mg via INTRAVENOUS
  Administered 2014-07-03: 6 mg via INTRAVENOUS
  Administered 2014-07-03: 16.5 mg via INTRAVENOUS
  Administered 2014-07-03: 1.57 mg via INTRAVENOUS
  Administered 2014-07-03: 7.41 mg via INTRAVENOUS
  Administered 2014-07-04: 10.5 mg via INTRAVENOUS
  Administered 2014-07-04: 15 mg via INTRAVENOUS
  Administered 2014-07-04: 1.5 mg via INTRAVENOUS
  Administered 2014-07-04: 4.5 mg via INTRAVENOUS
  Administered 2014-07-04: 4.69 mg via INTRAVENOUS
  Administered 2014-07-04: 21:00:00 via INTRAVENOUS
  Administered 2014-07-04: 1.5 mg via INTRAVENOUS
  Administered 2014-07-05: 10.5 mg via INTRAVENOUS
  Administered 2014-07-05: 13:00:00 via INTRAVENOUS
  Administered 2014-07-05: 10.5 mg via INTRAVENOUS
  Administered 2014-07-05: 57.45 mg via INTRAVENOUS
  Administered 2014-07-05: 3 mg via INTRAVENOUS
  Administered 2014-07-05: 13.19 mg via INTRAVENOUS
  Administered 2014-07-05: 22:00:00 via INTRAVENOUS
  Administered 2014-07-06: 57.45 mg via INTRAVENOUS
  Administered 2014-07-06: 6 mg via INTRAVENOUS
  Administered 2014-07-06: 10:00:00 via INTRAVENOUS
  Administered 2014-07-06: 40.8 mg via INTRAVENOUS
  Administered 2014-07-06: 19:00:00 via INTRAVENOUS
  Administered 2014-07-07: 6 mg via INTRAVENOUS
  Administered 2014-07-07 (×2): 7.5 mg via INTRAVENOUS
  Filled 2014-07-01 (×9): qty 25

## 2014-07-01 MED ORDER — NEOSTIGMINE METHYLSULFATE 10 MG/10ML IV SOLN
INTRAVENOUS | Status: AC
  Filled 2014-07-01: qty 1

## 2014-07-01 MED ORDER — MIDAZOLAM HCL 2 MG/2ML IJ SOLN
INTRAMUSCULAR | Status: AC
  Filled 2014-07-01: qty 2

## 2014-07-01 SURGICAL SUPPLY — 86 items
APPLIER CLIP 5 13 M/L LIGAMAX5 (MISCELLANEOUS)
APPLIER CLIP ROT 10 11.4 M/L (STAPLE)
BLADE EXTENDED COATED 6.5IN (ELECTRODE) ×3 IMPLANT
BLADE HEX COATED 2.75 (ELECTRODE) ×3 IMPLANT
BLADE SURG SZ10 CARB STEEL (BLADE) IMPLANT
CABLE HIGH FREQUENCY MONO STRZ (ELECTRODE) IMPLANT
CANISTER SUCTION 2500CC (MISCELLANEOUS) IMPLANT
CELLS DAT CNTRL 66122 CELL SVR (MISCELLANEOUS) ×1 IMPLANT
CHLORAPREP W/TINT 26ML (MISCELLANEOUS) ×3 IMPLANT
CLIP APPLIE 5 13 M/L LIGAMAX5 (MISCELLANEOUS) IMPLANT
CLIP APPLIE ROT 10 11.4 M/L (STAPLE) IMPLANT
COUNTER NEEDLE 20 DBL MAG RED (NEEDLE) IMPLANT
COVER MAYO STAND STRL (DRAPES) ×3 IMPLANT
DECANTER SPIKE VIAL GLASS SM (MISCELLANEOUS) IMPLANT
DISSECTOR BLUNT TIP ENDO 5MM (MISCELLANEOUS) IMPLANT
DRAIN CHANNEL 19F RND (DRAIN) ×3 IMPLANT
DRAPE LAPAROSCOPIC ABDOMINAL (DRAPES) ×3 IMPLANT
DRAPE SHEET LG 3/4 BI-LAMINATE (DRAPES) ×9 IMPLANT
DRAPE UTILITY XL STRL (DRAPES) ×6 IMPLANT
DRAPE WARM FLUID 44X44 (DRAPE) ×3 IMPLANT
DRSG OPSITE POSTOP 4X10 (GAUZE/BANDAGES/DRESSINGS) IMPLANT
DRSG OPSITE POSTOP 4X12 (GAUZE/BANDAGES/DRESSINGS) ×3 IMPLANT
DRSG OPSITE POSTOP 4X6 (GAUZE/BANDAGES/DRESSINGS) IMPLANT
DRSG OPSITE POSTOP 4X8 (GAUZE/BANDAGES/DRESSINGS) IMPLANT
ELECT PENCIL ROCKER SW 15FT (MISCELLANEOUS) ×3 IMPLANT
ELECT REM PT RETURN 9FT ADLT (ELECTROSURGICAL) ×3
ELECTRODE REM PT RTRN 9FT ADLT (ELECTROSURGICAL) ×1 IMPLANT
EVACUATOR SILICONE 100CC (DRAIN) IMPLANT
FILTER SMOKE EVAC LAPAROSHD (FILTER) IMPLANT
GAUZE SPONGE 4X4 12PLY STRL (GAUZE/BANDAGES/DRESSINGS) IMPLANT
GLOVE ECLIPSE 8.0 STRL XLNG CF (GLOVE) ×12 IMPLANT
GLOVE INDICATOR 8.0 STRL GRN (GLOVE) ×12 IMPLANT
GOWN STRL REUS W/TWL XL LVL3 (GOWN DISPOSABLE) ×24 IMPLANT
HOLDER FOLEY CATH W/STRAP (MISCELLANEOUS) ×3 IMPLANT
KIT BASIN OR (CUSTOM PROCEDURE TRAY) ×6 IMPLANT
LEGGING LITHOTOMY PAIR STRL (DRAPES) ×3 IMPLANT
LIGASURE IMPACT 36 18CM CVD LR (INSTRUMENTS) ×6 IMPLANT
MANIFOLD NEPTUNE II (INSTRUMENTS) ×3 IMPLANT
PACK GENERAL/GYN (CUSTOM PROCEDURE TRAY) ×3 IMPLANT
PENCIL BUTTON HOLSTER BLD 10FT (ELECTRODE) ×6 IMPLANT
RELOAD PROXIMATE 75MM BLUE (ENDOMECHANICALS) ×18 IMPLANT
RTRCTR WOUND ALEXIS 18CM MED (MISCELLANEOUS) ×3
SCISSORS LAP 5X35 DISP (ENDOMECHANICALS) ×3 IMPLANT
SCISSORS LAP 5X45 EPIX DISP (ENDOMECHANICALS) ×3 IMPLANT
SET IRRIG TUBING LAPAROSCOPIC (IRRIGATION / IRRIGATOR) ×3 IMPLANT
SHEARS HARMONIC ACE PLUS 36CM (ENDOMECHANICALS) IMPLANT
SHEARS HARMONIC ACE PLUS 45CM (MISCELLANEOUS) ×3 IMPLANT
SLEEVE XCEL OPT CAN 5 100 (ENDOMECHANICALS) ×12 IMPLANT
SOLUTION ANTI FOG 6CC (MISCELLANEOUS) ×3 IMPLANT
SPONGE LAP 18X18 X RAY DECT (DISPOSABLE) ×21 IMPLANT
STAPLER CIRC CVD 29MM 37CM (STAPLE) ×3 IMPLANT
STAPLER CIRC ILS CVD 25MM (STAPLE) ×3 IMPLANT
STAPLER CUT CVD 40MM BLUE (STAPLE) ×6 IMPLANT
STAPLER PROXIMATE 75MM BLUE (STAPLE) ×6 IMPLANT
STAPLER VISISTAT 35W (STAPLE) IMPLANT
SUCTION POOLE TIP (SUCTIONS) ×6 IMPLANT
SUT ETHILON 2 0 PS N (SUTURE) IMPLANT
SUT PDS AB 1 CTX 36 (SUTURE) IMPLANT
SUT PDS AB 1 TP1 96 (SUTURE) ×3 IMPLANT
SUT PROLENE 2 0 KS (SUTURE) IMPLANT
SUT PROLENE 2 0 SH DA (SUTURE) ×6 IMPLANT
SUT SILK 2 0 (SUTURE)
SUT SILK 2 0 SH CR/8 (SUTURE) ×3 IMPLANT
SUT SILK 2-0 18XBRD TIE 12 (SUTURE) IMPLANT
SUT SILK 3 0 (SUTURE)
SUT SILK 3 0 SH CR/8 (SUTURE) ×3 IMPLANT
SUT SILK 3-0 18XBRD TIE 12 (SUTURE) IMPLANT
SUT VIC AB 3-0 SH 27 (SUTURE) ×2
SUT VIC AB 3-0 SH 27X BRD (SUTURE) ×1 IMPLANT
SUT VICRYL 2 0 18  UND BR (SUTURE)
SUT VICRYL 2 0 18 UND BR (SUTURE) IMPLANT
SYR BULB IRRIGATION 50ML (SYRINGE) IMPLANT
SYS LAPSCP GELPORT 120MM (MISCELLANEOUS) ×3
SYSTEM LAPSCP GELPORT 120MM (MISCELLANEOUS) ×1 IMPLANT
TOWEL OR 17X26 10 PK STRL BLUE (TOWEL DISPOSABLE) ×6 IMPLANT
TOWEL OR NON WOVEN STRL DISP B (DISPOSABLE) ×6 IMPLANT
TRAY FOLEY CATH 14FRSI W/METER (CATHETERS) IMPLANT
TRAY FOLEY CATH 16FRSI W/METER (SET/KITS/TRAYS/PACK) ×3 IMPLANT
TRAY LAP CHOLE (CUSTOM PROCEDURE TRAY) ×3 IMPLANT
TROCAR BLADELESS OPT 5 100 (ENDOMECHANICALS) ×3 IMPLANT
TROCAR XCEL BLUNT TIP 100MML (ENDOMECHANICALS) IMPLANT
TROCAR XCEL NON-BLD 11X100MML (ENDOMECHANICALS) IMPLANT
TUBING CONNECTING 10 (TUBING) ×2 IMPLANT
TUBING CONNECTING 10' (TUBING) ×1
TUBING INSUFFLATION 10FT LAP (TUBING) ×3 IMPLANT
YANKAUER SUCT BULB TIP 10FT TU (MISCELLANEOUS) ×6 IMPLANT

## 2014-07-01 NOTE — Brief Op Note (Signed)
07/01/2014  12:33 PM  PATIENT:  [name] [name]  48 y.o. male  PRE-OPERATIVE DIAGNOSIS:  recurrent sigmoid diverticulitis  POST-OPERATIVE DIAGNOSIS:  recurrent sigmoid diverticulitis  PROCEDURE:  Procedure(s): LAPAROSCOPIC ASSISTED  SUBTOTAL COLECTOMY INCIDENTAL APPENDECTOMY, PARTIAL OMENTECTOMY AND RIGID PROCTOSCOPY (N/A)  SURGEON:  Surgeon(s) and Role:    * Avel Peace, MD - Primary    * Karie Soda, MD - Assisting    ANESTHESIA:   general  EBL:  Total I/O In: 5000 [I.V.:5000] Out: 960 [Urine:160; Blood:800]  BLOOD ADMINISTERED:none  DRAINS: (19) Blake drain(s) in the pelvis   LOCAL MEDICATIONS USED:  MARCAINE      DISPOSITION OF SPECIMEN:  PATHOLOGY  COUNTS:  YES  TOURNIQUET:  * No tourniquets in log *  DICTATION: .Dragon Dictation  PLAN OF CARE: Admit to inpatient   PATIENT DISPOSITION:  PACU - hemodynamically stable.   Delay start of Pharmacological VTE agent (>24hrs) due to surgical blood loss or risk of bleeding: yes

## 2014-07-01 NOTE — Transfer of Care (Signed)
Immediate Anesthesia Transfer of Care Note  Patient: [name] [name]  Procedure(s) Performed: Procedure(s): LAPAROSCOPIC ASSISTED  SUBTOTAL COLECTOMY INCIDENTAL APPENDECTOMY, PARTIAL OMENTECTOMY AND RIGID PROCTOSCOPY (N/A)  Patient Location: PACU  Anesthesia Type:General  Level of Consciousness: awake, alert , oriented and patient cooperative  Airway & Oxygen Therapy: Patient Spontanous Breathing and Patient connected to face mask oxygen  Post-op Assessment: Report given to PACU RN, Post -op Vital signs reviewed and stable and Patient moving all extremities X 4  Post vital signs: stable  Complications: No apparent anesthesia complications

## 2014-07-01 NOTE — Op Note (Signed)
Operative Note  [name] [name] male 48 y.o. 07/01/2014  PREOPERATIVE DX:  Recurrent sigmoid diverticulitis  POSTOPERATIVE DX:  Same  PROCEDURE:laparoscopic-assisted subtotal colectomy with partial omentectomy, incidental appendectomy, rigid proctosigmoidoscopy.         Surgeon: Adolph Pollack   Assistants: Estelle Grumbles, M.D.  Anesthesia: General endotracheal anesthesia  Indications: Mr. Darrold Span is a 15 short male who's had recurring episodes of left-sided and sigmoid diverticulitis. Colonoscopy demonstrates diverticular disease involving the sigmoid colon and descending colon. He has been symptom free on suppressive antibiotics and now presents for partial colectomy.    Procedure Detail:  He was brought to the operating room placed supine on the operating table and general anesthetic was given. He was placed in the lithotomy position. A Foley catheter was inserted. An oral gastric tube was inserted. The hair on the abdominal wall was clipped. The abdominal wall and perineal areas were sterilely prepped and draped.  He was placed in slight reverse Trendelenburg position. Using a 5 mm Optiview trocar and laparoscope, access was gained into the peritoneal cavity and a pneumoperitoneum was created. Inspection of the area underneath the trocar demonstrated no evidence of organ injury or bleeding. A 5 mm trocar was placed in the supraumbilical position. A 5 mm trocar was placed in the right lower quadrant. A 5 mm trocar was placed in the suprapubic position. A 5 mm trocar was placed in the left lower quadrant. The left and sigmoid colon were both visualized. Inflammatory changes were noted beginning at the mid to proximal sigmoid colon. These extended all the way up the left colon toward the splenic flexure. Using sharp dissection I mobilized the sigmoid colon and the descending colon by dividing the lateral attachments. There were some dense inflammatory adhesions present.  I mobilized the  omentum from some adhesions to the lateral abdominal wall and the left upper quadrant region. I then began mobilizing the splenic flexure. The omental interface to the splenic flexure was fairly densely adherent. While mobilizing the splenic flexure there were very dense adhesions to the retroperitoneal structures.  I continued my mobilization around to the mid transverse colon.  At this point, I felt that the splenic flexure was adequately mobilized. The suprapubic trocar was removed and a limited lower midline incision was made dividing all tissue layers and entering the peritoneal cavity. A wound protection device was placed. The left ureter was identified. Plane of dissection was kept away from it. In handling the left colon, it was fairly abnormal with respect to firmness in thickness all the way around the splenic flexure to the distal transverse colon area. There also was foreshortened mesentery present. This did not allow for good mobilization to the pelvic area. I identified the rectal sigmoid junction. I divided the rectum just distal to this with a linear cutting stapler. I then began to divide the mesentery close to the colon carrying this up around to the splenic flexure.  I divided the colon just to proximal to the splenic flexure.  I continued to try to gain length in order to bring down the distal transverse colon to the pelvis but because of the short mesentery was unable to do so. I then used a hand assist device to completely mobilize the transverse colon and part of the hepatic flexure. I tried once again to approximate the rectum to the mid transverse colon,, but because of the short mesentery still was not able to do so. At this point I divided to decided to extend the  incision proximally to above the umbilicus for better exposure and this was done. There was not any normal descending colon that could be approximated to the rectal stump. I subsequently decided to mobilize the right colon and  hepatic flexure which was done. I divided the middle colic vessels and left the right colic and ileocolic vessels intact. I perform an incidental appendectomy as my plan was to approximate the mid descending colon to the rectum. Once the right colon had been mobilized, I was able to approximate the mid ascending colon to the rectum without any tension. I divided the colon at the mid ascending colon.  The staple line was removed and a size 29 EEA anvil was placed into the lumen of the ascending colon and brought out the side of it and at a tinea.  It was secured there with a 2-0 Prolene pursestring suture. The 29 EEA handle was then introduced through the rectum and the rectum to side descending colon the anastomosis was performed. The residual descending colon distally anastomoses was removed using the linear cutting stapler. The anastomosis did not appear to be under any tension.  2 complete donuts were noted.  An air leak test was performed and no leak was noted. However, it appeared to be some discoloration at the anastomosis. I inspected the abdominal cavity for bleeding and none was found. I then reinspected the anastomosis and noted there was more bluish discoloration and a small leak at the lateral aspect of the anastomosis. Because of this, I decided to resect the anastomosis as well as the remaining colon and perform a ileum to rectal anastomosis.  I divided the terminal ileum just proximal to the cecum with the linear cutting stapler. The rectum was divided just distal to the anastomosis with the linear cutting stapler. The mesentery in between was divided with the LigaSure and the anastomosis as well as the remaining colon was handed off the field. I removed the staple line from the terminal ileum and a size 25 EEA anvil was placed into the ilium and brought out the side. He was secured with a 2-0 Prolene pursestring suture. The open end of the ileum was then sealed with a Babcock clamp. A side ileum to  end rectum anastomosis was then performed with the EEA stapler. Extra ileum was removed with the GIA stapler. The anastomosis was patent, viable, under no tension, and showed no evidence of leak on the leak test.  2 complete donuts were noted. There was no evidence of ischemia.  I then performed a partial omentectomy using the LigaSure device. Following this, the abdominal cavity was copiously irrigated out with saline. There is no evidence of organ injury bleeding. All trocars were removed. A size 19 Blake drain was placed through the left lower quadrant trocar incision and positioned in the pelvis. It was anchored to the skin with 3-0 nylon suture.  The midline fascia was then closed with a running #1 double loop PDS suture. The subcutaneous tissue was irrigated. All skin incisions were close with staples. The drain was hooked up to bulb suction. Sterile dressings were applied.  He tolerated the procedure well without any apparent complications he was taken to the recovery room in satisfactory condition.  Estimated Blood Loss:  800 ml         Drains: # 19 Blake drain  Blood Given: none          Specimens: Colon, appendix, part of omentum        Complications:  *  No complications entered in OR log *         Disposition: PACU - hemodynamically stable.         Condition: stable

## 2014-07-01 NOTE — Anesthesia Postprocedure Evaluation (Signed)
  Anesthesia Post-op Note  Patient: [name] [name]  Procedure(s) Performed: Procedure(s) (LRB): LAPAROSCOPIC ASSISTED  SUBTOTAL COLECTOMY INCIDENTAL APPENDECTOMY, PARTIAL OMENTECTOMY AND RIGID PROCTOSCOPY (N/A)  Patient Location: PACU  Anesthesia Type: General  Level of Consciousness: awake and alert   Airway and Oxygen Therapy: Patient Spontanous Breathing  Post-op Pain: mild  Post-op Assessment: Post-op Vital signs reviewed, Patient's Cardiovascular Status Stable, Respiratory Function Stable, Patent Airway and No signs of Nausea or vomiting  Last Vitals:  Filed Vitals:   07/01/14 1525  BP: 135/77  Pulse: 88  Temp: 36.6 C  Resp: 26    Post-op Vital Signs: stable   Complications: No apparent anesthesia complications

## 2014-07-01 NOTE — Progress Notes (Signed)
Awake and alert.  Stable on the floor.  Explained operation to him and his family.

## 2014-07-01 NOTE — Interval H&P Note (Signed)
History and Physical Interval Note:  07/01/2014 7:20 AM  [name] [name]  has presented today for surgery, with the diagnosis of recurrent sigmoid diverticulitis  The various methods of treatment have been discussed with the patient and family. After consideration of risks, benefits and other options for treatment, the patient has consented to  Procedure(s): LAPAROSCOPIC PARTIAL COLECTOMY (N/A) as a surgical intervention .  The patient's history has been reviewed, patient examined, no change in status, stable for surgery.  I have reviewed the patient's chart and labs.  Questions were answered to the patient's satisfaction.     Donnarae Rae Shela Commons

## 2014-07-01 NOTE — H&P (View-Only) (Signed)
Patient ID: CHIJIOKE LASSER, male   DOB: November 11, 1965, 48 y.o.   MRN: [id number]  Chief Complaint  Patient presents with  . Follow-up    HPI DESHANE COTRONEO is a 48 y.o. male.   HPI  He is here for a follow up visit because of his recurrent sigmoid diverticulitis. Colonoscopy was done recently. No inflammatory changes were seen. Diverticula were seen in the distal descending colon and sigmoid colon.  He currently is pain-free. He is still on  Antibiotics. He has occasional diarrhea.  Past Medical History  Diagnosis Date  . Hypertension   . Sarcoidosis   . Hyperlipemia   . Depression   . Diverticulitis   . CVA (cerebral vascular accident)   . Allergy     SEASONAL  . Hypercholesteremia 06/15/2012    Past Surgical History  Procedure Laterality Date  . Vertebral artery stenting  2008    Family History  Problem Relation Age of Onset  . Emphysema Paternal Grandfather   . Heart disease Paternal Grandfather   . Allergies Mother   . Hypertension Mother   . Skin cancer Father   . Colon cancer Neg Hx     Social History History  Substance Use Topics  . Smoking status: Never Smoker   . Smokeless tobacco: Never Used  . Alcohol Use: Yes     Comment: seldom    Allergies  Allergen Reactions  . Celebrex [Celecoxib]     Mother is allergic, so I do not take it  . Sulfa Antibiotics Other (See Comments)    unknown  . Codeine Rash  . Dilaudid [Hydromorphone Hcl] Nausea And Vomiting and Anxiety    Current Outpatient Prescriptions  Medication Sig Dispense Refill  . aspirin 81 MG tablet Take 81 mg by mouth daily.      . diphenhydrAMINE (BENADRYL) 25 MG tablet Take 25 mg by mouth every 6 (six) hours as needed for allergies.       . hyoscyamine (LEVSIN SL) 0.125 MG SL tablet Place 0.125 mg under the tongue every 6 (six) hours as needed for cramping.      Marland Kitchen ibuprofen (ADVIL,MOTRIN) 200 MG tablet Take 400 mg by mouth every 6 (six) hours as needed (for pain). For pain      .  losartan-hydrochlorothiazide (HYZAAR) 50-12.5 MG per tablet Take 0.5 tablets by mouth daily.      Marland Kitchen zolpidem (AMBIEN) 10 MG tablet Take 10 mg by mouth at bedtime as needed. For sleep      . oxyCODONE-acetaminophen (PERCOCET/ROXICET) 5-325 MG per tablet Take 2 tablets by mouth every 4 (four) hours as needed for severe pain.  15 tablet  0   Current Facility-Administered Medications  Medication Dose Route Frequency Provider Last Rate Last Dose  . 0.9 %  sodium chloride infusion  500 mL Intravenous Continuous Hilarie Fredrickson, MD        Review of Systems Review of Systems  Constitutional: Negative for fever and chills.  Gastrointestinal: Positive for diarrhea. Negative for abdominal pain.    Blood pressure 122/82, pulse 75, temperature 97.4 F (36.3 C), height 6' (1.829 m), weight 225 lb (102.059 kg).  Physical Exam Physical Exam  Constitutional: No distress.  Obese.  HENT:  Head: Normocephalic and atraumatic.  Cardiovascular: Normal rate and regular rhythm.   Pulmonary/Chest: Effort normal and breath sounds normal.  Abdominal: Soft. He exhibits no distension and no mass. There is no tenderness.  Neurological: He is alert.  Skin: Skin is warm and dry.  Psychiatric: He has a normal mood and affect. His behavior is normal.    Data Reviewed Colonoscopy report  Assessment    Recurrent sigmoid diverticulitis. He is on suppressive antibiotics and has no symptoms. He is interested in having a partial colectomy.    Plan    Laparoscopic possible open partial colectomy.  I have explained the procedure and risks of colon resection.  Risks include but are not limited to bleeding, infection, wound problems, anesthesia, anastomotic leak, need for colostomy, need for reoperative surgery,  injury to intraabominal organs (such as intestine, spleen, kidney, bladder, ureter, etc.), ileus, irregular bowel habits.  He seems to understand and would like to proceed.        Kivon Aprea  J 06/02/2014, 11:34 AM

## 2014-07-02 ENCOUNTER — Encounter (HOSPITAL_COMMUNITY): Payer: Self-pay | Admitting: General Surgery

## 2014-07-02 LAB — CBC
HCT: 35.5 % — ABNORMAL LOW (ref 39.0–52.0)
HEMOGLOBIN: 12 g/dL — AB (ref 13.0–17.0)
MCH: 30.7 pg (ref 26.0–34.0)
MCHC: 33.8 g/dL (ref 30.0–36.0)
MCV: 90.8 fL (ref 78.0–100.0)
Platelets: 317 10*3/uL (ref 150–400)
RBC: 3.91 MIL/uL — ABNORMAL LOW (ref 4.22–5.81)
RDW: 13.5 % (ref 11.5–15.5)
WBC: 21 10*3/uL — ABNORMAL HIGH (ref 4.0–10.5)

## 2014-07-02 LAB — BASIC METABOLIC PANEL
ANION GAP: 10 (ref 5–15)
BUN: 11 mg/dL (ref 6–23)
CHLORIDE: 102 meq/L (ref 96–112)
CO2: 26 mEq/L (ref 19–32)
Calcium: 7.9 mg/dL — ABNORMAL LOW (ref 8.4–10.5)
Creatinine, Ser: 1.04 mg/dL (ref 0.50–1.35)
GFR calc Af Amer: >90 mL/min (ref >90)
GFR calc non Af Amer: 84 mL/min — ABNORMAL LOW (ref >90)
Glucose, Bld: 161 mg/dL — ABNORMAL HIGH (ref 70–99)
Potassium: 4.5 mEq/L (ref 3.7–5.3)
SODIUM: 138 meq/L (ref 137–147)

## 2014-07-02 MED ORDER — KETOROLAC TROMETHAMINE 30 MG/ML IJ SOLN
30.0000 mg | Freq: Four times a day (QID) | INTRAMUSCULAR | Status: AC
  Administered 2014-07-02 – 2014-07-03 (×6): 30 mg via INTRAVENOUS
  Filled 2014-07-02 (×7): qty 1

## 2014-07-02 NOTE — Progress Notes (Signed)
1 Day Post-Op  Subjective: Very sore this morning.  No nausea.  Objective: Vital signs in last 24 hours: Temp:  [97.9 F (36.6 C)-98.8 F (37.1 C)] 98.8 F (37.1 C) (09/23 0600) Pulse Rate:  [69-118] 93 (09/23 0600) Resp:  [15-29] 18 (09/23 0600) BP: (104-140)/(62-95) 104/62 mmHg (09/23 0600) SpO2:  [92 %-100 %] 98 % (09/23 0600) Weight:  [227 lb 15.3 oz (103.4 kg)] 227 lb 15.3 oz (103.4 kg) (09/22 1721)    Intake/Output from previous day: 09/22 0701 - 09/23 0700 In: 8491.5 [I.V.:8491.5] Out: 3390 [Urine:2260; Drains:330; Blood:800] Intake/Output this shift:    PE: General- In NAD Abdomen-soft, some dried drainage on dressings, hypoactive bowel sounds, thin serosanguinous drain output  Lab Results:   Recent Labs  07/02/14 0448  WBC 21.0*  HGB 12.0*  HCT 35.5*  PLT 317   BMET  Recent Labs  07/02/14 0448  NA 138  K 4.5  CL 102  CO2 26  GLUCOSE 161*  BUN 11  CREATININE 1.04  CALCIUM 7.9*   PT/INR No results found for this basename: LABPROT, INR,  in the last 72 hours Comprehensive Metabolic Panel:    Component Value Date/Time   NA 138 07/02/2014 0448   NA 140 06/25/2014 1445   K 4.5 07/02/2014 0448   K 3.8 06/25/2014 1445   CL 102 07/02/2014 0448   CL 105 06/25/2014 1445   CO2 26 07/02/2014 0448   CO2 22 06/25/2014 1445   BUN 11 07/02/2014 0448   BUN 12 06/25/2014 1445   CREATININE 1.04 07/02/2014 0448   CREATININE 1.19 06/25/2014 1445   GLUCOSE 161* 07/02/2014 0448   GLUCOSE 78 06/25/2014 1445   CALCIUM 7.9* 07/02/2014 0448   CALCIUM 9.7 06/25/2014 1445   AST 25 06/25/2014 1445   AST 24 06/18/2014 0910   ALT 33 06/25/2014 1445   ALT 26 06/18/2014 0910   ALKPHOS 69 06/25/2014 1445   ALKPHOS 66 06/18/2014 0910   BILITOT 0.7 06/25/2014 1445   BILITOT 0.5 06/18/2014 0910   PROT 7.6 06/25/2014 1445   PROT 7.4 06/18/2014 0910   ALBUMIN 4.2 06/25/2014 1445   ALBUMIN 4.0 06/18/2014 0910     Studies/Results: No results found.  Anti-infectives: Anti-infectives   Start      Dose/Rate Route Frequency Ordered Stop   07/01/14 2000  cefoTEtan (CEFOTAN) 2 g in dextrose 5 % 50 mL IVPB     2 g 100 mL/hr over 30 Minutes Intravenous Every 12 hours 07/01/14 1339 07/01/14 2015   07/01/14 0625  cefoTEtan in Dextrose 5% (CEFOTAN) IVPB 2 g  Status:  Discontinued     2 g Intravenous Every 12 hours 07/01/14 0625 07/01/14 1413      Assessment Principal Problem:   Sigmoid diverticulitis-recurrent s/p lap assisted subtotal colectomy-stable overnight; pain control is fair    LOS: 1 day   Plan: OOB.  Clear liquids. Add Toradol.  Lovenox for DVT prophylaxis.   Royce Sciara Shela Commons 07/02/2014

## 2014-07-02 NOTE — Care Management Note (Addendum)
    Page 1 of 2   07/28/2014     12:13:57 PM CARE MANAGEMENT NOTE 07/28/2014  Patient:  [name] [name], [name] [name]   Account Number:  [id number]  Date Initiated:  07/02/2014  Documentation initiated by:  Lorenda Ishihara  Subjective/Objective Assessment:   48 yo male admitted s/p colectomy. PTA lived at home with spouse.     Action/Plan:   Home when stable   Anticipated DC Date:  07/28/2014   Anticipated DC Plan:  HOME W HOME HEALTH SERVICES      DC Planning Services  CM consult      [facility] Choice  HOME HEALTH   Choice offered to / List presented to:  C-1 Patient        HH arranged  HH-1 RN  HH-10 DISEASE MANAGEMENT      HH agency  [facility]   Status of service:  Completed, signed off Medicare Important Message given?   (If response is "NO", the following Medicare IM given date fields will be blank) Date Medicare IM given:   Medicare IM given by:   Date Additional Medicare IM given:   Additional Medicare IM given by:    Discharge Disposition:  HOME W HOME HEALTH SERVICES  Per UR Regulation:  Reviewed for med. necessity/level of care/duration of stay  If discussed at Long Length of Stay Meetings, dates discussed:   07/08/2014  07/10/2014  07/15/2014  07/17/2014  07/22/2014  07/24/2014    Comments:

## 2014-07-03 LAB — CBC
HCT: 29.2 % — ABNORMAL LOW (ref 39.0–52.0)
Hemoglobin: 10.1 g/dL — ABNORMAL LOW (ref 13.0–17.0)
MCH: 31 pg (ref 26.0–34.0)
MCHC: 34.6 g/dL (ref 30.0–36.0)
MCV: 89.6 fL (ref 78.0–100.0)
PLATELETS: 238 10*3/uL (ref 150–400)
RBC: 3.26 MIL/uL — AB (ref 4.22–5.81)
RDW: 13.5 % (ref 11.5–15.5)
WBC: 16.1 10*3/uL — AB (ref 4.0–10.5)

## 2014-07-03 MED ORDER — ENOXAPARIN SODIUM 40 MG/0.4ML ~~LOC~~ SOLN
40.0000 mg | SUBCUTANEOUS | Status: DC
  Administered 2014-07-03 – 2014-07-04 (×2): 40 mg via SUBCUTANEOUS
  Filled 2014-07-03 (×2): qty 0.4

## 2014-07-03 NOTE — Progress Notes (Signed)
2 Days Post-Op  Subjective: Less sore today.  Says he did not get out of bed yesterday and he did not get a clear liquid diet.  No nausea.  No flatus or BM.    Objective: Vital signs in last 24 hours: Temp:  [98.6 F (37 C)-99.7 F (37.6 C)] 99.7 F (37.6 C) (09/24 0533) Pulse Rate:  [77-90] 90 (09/24 0533) Resp:  [18-23] 18 (09/24 0533) BP: (113-148)/(60-80) 113/71 mmHg (09/24 0533) SpO2:  [96 %-99 %] 99 % (09/24 0533) FiO2 (%):  [37 %-41 %] 37 % (09/24 0458)    Intake/Output from previous day: 09/23 0701 - 09/24 0700 In: 2980 [P.O.:480; I.V.:2500] Out: 3025 [Urine:2800; Drains:225] Intake/Output this shift:    PE: General- In NAD Abdomen-soft, some dried drainage on dressings, hypoactive bowel sounds, thin serosanguinous drain output  Lab Results:   Recent Labs  07/02/14 0448 07/03/14  WBC 21.0* 16.1*  HGB 12.0* 10.1*  HCT 35.5* 29.2*  PLT 317 238   BMET  Recent Labs  07/02/14 0448  NA 138  K 4.5  CL 102  CO2 26  GLUCOSE 161*  BUN 11  CREATININE 1.04  CALCIUM 7.9*   PT/INR No results found for this basename: LABPROT, INR,  in the last 72 hours Comprehensive Metabolic Panel:    Component Value Date/Time   NA 138 07/02/2014 0448   NA 140 06/25/2014 1445   K 4.5 07/02/2014 0448   K 3.8 06/25/2014 1445   CL 102 07/02/2014 0448   CL 105 06/25/2014 1445   CO2 26 07/02/2014 0448   CO2 22 06/25/2014 1445   BUN 11 07/02/2014 0448   BUN 12 06/25/2014 1445   CREATININE 1.04 07/02/2014 0448   CREATININE 1.19 06/25/2014 1445   GLUCOSE 161* 07/02/2014 0448   GLUCOSE 78 06/25/2014 1445   CALCIUM 7.9* 07/02/2014 0448   CALCIUM 9.7 06/25/2014 1445   AST 25 06/25/2014 1445   AST 24 06/18/2014 0910   ALT 33 06/25/2014 1445   ALT 26 06/18/2014 0910   ALKPHOS 69 06/25/2014 1445   ALKPHOS 66 06/18/2014 0910   BILITOT 0.7 06/25/2014 1445   BILITOT 0.5 06/18/2014 0910   PROT 7.6 06/25/2014 1445   PROT 7.4 06/18/2014 0910   ALBUMIN 4.2 06/25/2014 1445   ALBUMIN 4.0 06/18/2014 0910      Studies/Results: No results found.  Anti-infectives: Anti-infectives   Start     Dose/Rate Route Frequency Ordered Stop   07/01/14 2000  cefoTEtan (CEFOTAN) 2 g in dextrose 5 % 50 mL IVPB     2 g 100 mL/hr over 30 Minutes Intravenous Every 12 hours 07/01/14 1339 07/01/14 2015   07/01/14 0625  cefoTEtan in Dextrose 5% (CEFOTAN) IVPB 2 g  Status:  Discontinued     2 g Intravenous Every 12 hours 07/01/14 0625 07/01/14 1413      Assessment Principal Problem:   Sigmoid diverticulitis-recurrent s/p lap assisted subtotal colectomy:  Has not been OOB per patient   ABL anemia   LOS: 2 days   Plan: Mobilize.  Liquid diet.  Monitor hemoglobin.   [name] [name] Shela Commons 07/03/2014

## 2014-07-04 LAB — CBC
HCT: 27.3 % — ABNORMAL LOW (ref 39.0–52.0)
HEMATOCRIT: 28.2 % — AB (ref 39.0–52.0)
HEMOGLOBIN: 9.8 g/dL — AB (ref 13.0–17.0)
Hemoglobin: 9.6 g/dL — ABNORMAL LOW (ref 13.0–17.0)
MCH: 31.1 pg (ref 26.0–34.0)
MCH: 31.3 pg (ref 26.0–34.0)
MCHC: 34.8 g/dL (ref 30.0–36.0)
MCHC: 35.2 g/dL (ref 30.0–36.0)
MCV: 89.5 fL (ref 78.0–100.0)
MCV: 88.9 fL (ref 78.0–100.0)
PLATELETS: 307 10*3/uL (ref 150–400)
Platelets: 270 10*3/uL (ref 150–400)
RBC: 3.15 MIL/uL — ABNORMAL LOW (ref 4.22–5.81)
RBC: 3.07 MIL/uL — ABNORMAL LOW (ref 4.22–5.81)
RDW: 13.5 % (ref 11.5–15.5)
RDW: 13.3 % (ref 11.5–15.5)
WBC: 19.2 10*3/uL — ABNORMAL HIGH (ref 4.0–10.5)
WBC: 17.4 10*3/uL — ABNORMAL HIGH (ref 4.0–10.5)

## 2014-07-04 NOTE — Progress Notes (Signed)
3 Days Post-Op  Subjective: Had some nausea and vomiting last night and early this morning.  One small, loose BM.  No flatus.  Feels bloated.   Objective: Vital signs in last 24 hours: Temp:  [98.3 F (36.8 C)-99.2 F (37.3 C)] 98.8 F (37.1 C) (09/25 0430) Pulse Rate:  [72-88] 72 (09/25 0430) Resp:  [18-23] 20 (09/25 0430) BP: (130-154)/(78-94) 142/94 mmHg (09/25 0430) SpO2:  [94 %-100 %] 98 % (09/25 0833) FiO2 (%):  [37 %-39 %] 37 % (09/25 0419) Last BM Date: 07/04/14  Intake/Output from previous day: 09/24 0701 - 09/25 0700 In: 2547.5 [P.O.:480; I.V.:2067.5] Out: 1200 [Urine:1100; Drains:100] Intake/Output this shift:    PE: General- In NAD Abdomen-Soft, slightly distended, quiet, incisions are clean and intact, thin serosanguinous drain output  Lab Results:   Recent Labs  07/03/14 07/04/14 0100  WBC 16.1* 19.2*  HGB 10.1* 9.8*  HCT 29.2* 28.2*  PLT 238 270   BMET  Recent Labs  07/02/14 0448  NA 138  K 4.5  CL 102  CO2 26  GLUCOSE 161*  BUN 11  CREATININE 1.04  CALCIUM 7.9*   PT/INR No results found for this basename: LABPROT, INR,  in the last 72 hours Comprehensive Metabolic Panel:    Component Value Date/Time   NA 138 07/02/2014 0448   NA 140 06/25/2014 1445   K 4.5 07/02/2014 0448   K 3.8 06/25/2014 1445   CL 102 07/02/2014 0448   CL 105 06/25/2014 1445   CO2 26 07/02/2014 0448   CO2 22 06/25/2014 1445   BUN 11 07/02/2014 0448   BUN 12 06/25/2014 1445   CREATININE 1.04 07/02/2014 0448   CREATININE 1.19 06/25/2014 1445   GLUCOSE 161* 07/02/2014 0448   GLUCOSE 78 06/25/2014 1445   CALCIUM 7.9* 07/02/2014 0448   CALCIUM 9.7 06/25/2014 1445   AST 25 06/25/2014 1445   AST 24 06/18/2014 0910   ALT 33 06/25/2014 1445   ALT 26 06/18/2014 0910   ALKPHOS 69 06/25/2014 1445   ALKPHOS 66 06/18/2014 0910   BILITOT 0.7 06/25/2014 1445   BILITOT 0.5 06/18/2014 0910   PROT 7.6 06/25/2014 1445   PROT 7.4 06/18/2014 0910   ALBUMIN 4.2 06/25/2014 1445   ALBUMIN 4.0 06/18/2014  0910     Studies/Results: No results found.  Anti-infectives: Anti-infectives   Start     Dose/Rate Route Frequency Ordered Stop   07/01/14 2000  cefoTEtan (CEFOTAN) 2 g in dextrose 5 % 50 mL IVPB     2 g 100 mL/hr over 30 Minutes Intravenous Every 12 hours 07/01/14 1339 07/01/14 2015   07/01/14 0625  cefoTEtan in Dextrose 5% (CEFOTAN) IVPB 2 g  Status:  Discontinued     2 g Intravenous Every 12 hours 07/01/14 0625 07/01/14 1413      Assessment Principal Problem:   Sigmoid diverticulitis-recurrent s/p lap assisted subtotal colectomy:  Has a postop ileus; no overt signs of infection or anastomotic leak.  Pathology c/w diverticular disease-discussed with him.    LOS: 3 days   Plan: Decrease diet to ice chips.  Wait for ileus to resolve.   Wymon Swaney Shela Commons 07/04/2014

## 2014-07-05 LAB — BASIC METABOLIC PANEL
Anion gap: 11 (ref 5–15)
BUN: 12 mg/dL (ref 6–23)
CALCIUM: 8.1 mg/dL — AB (ref 8.4–10.5)
CO2: 26 mEq/L (ref 19–32)
CREATININE: 0.75 mg/dL (ref 0.50–1.35)
Chloride: 104 mEq/L (ref 96–112)
GFR calc Af Amer: >90 mL/min (ref >90)
GLUCOSE: 115 mg/dL — AB (ref 70–99)
Potassium: 3.4 mEq/L — ABNORMAL LOW (ref 3.7–5.3)
SODIUM: 141 meq/L (ref 137–147)

## 2014-07-05 LAB — TYPE AND SCREEN
ABO/RH(D): O POS
Antibody Screen: POSITIVE
DAT, IgG: NEGATIVE
Donor AG Type: NEGATIVE
Donor AG Type: NEGATIVE
PT AG Type: NEGATIVE
UNIT DIVISION: 0
Unit division: 0

## 2014-07-05 LAB — CBC
HEMATOCRIT: 26.4 % — AB (ref 39.0–52.0)
Hemoglobin: 9.1 g/dL — ABNORMAL LOW (ref 13.0–17.0)
MCH: 30.6 pg (ref 26.0–34.0)
MCHC: 34.5 g/dL (ref 30.0–36.0)
MCV: 88.9 fL (ref 78.0–100.0)
PLATELETS: 308 10*3/uL (ref 150–400)
RBC: 2.97 MIL/uL — ABNORMAL LOW (ref 4.22–5.81)
RDW: 13.4 % (ref 11.5–15.5)
WBC: 15.2 10*3/uL — ABNORMAL HIGH (ref 4.0–10.5)

## 2014-07-05 MED ORDER — ENOXAPARIN SODIUM 40 MG/0.4ML ~~LOC~~ SOLN
40.0000 mg | SUBCUTANEOUS | Status: DC
  Administered 2014-07-05 – 2014-07-11 (×7): 40 mg via SUBCUTANEOUS
  Filled 2014-07-05 (×7): qty 0.4

## 2014-07-05 NOTE — Progress Notes (Signed)
4 Days Post-Op  Subjective: Nausea a little better.  No flatus.  One liquid, green BM. Objective: Vital signs in last 24 hours: Temp:  [97.4 F (36.3 C)-98.5 F (36.9 C)] 98.5 F (36.9 C) (09/26 0616) Pulse Rate:  [67-75] 67 (09/26 0616) Resp:  [18-30] 22 (09/26 0747) BP: (117-152)/(78-90) 129/78 mmHg (09/26 0616) SpO2:  [96 %-99 %] 98 % (09/26 0747) FiO2 (%):  [36 %-42 %] 42 % (09/26 0453) Last BM Date: 07/04/14  Intake/Output from previous day: 09/25 0701 - 09/26 0700 In: 3400 [P.O.:600; I.V.:2800] Out: 280 [Drains:279; Stool:1] Intake/Output this shift:    PE: General- In NAD Abdomen-Soft, slightly distended, quiet, incisions are clean and intact, thin serosanguinous drain output  Lab Results:   Recent Labs  07/04/14 1651 07/05/14 0516  WBC 17.4* 15.2*  HGB 9.6* 9.1*  HCT 27.3* 26.4*  PLT 307 308   BMET  Recent Labs  07/05/14 0516  NA 141  K 3.4*  CL 104  CO2 26  GLUCOSE 115*  BUN 12  CREATININE 0.75  CALCIUM 8.1*   PT/INR No results found for this basename: LABPROT, INR,  in the last 72 hours Comprehensive Metabolic Panel:    Component Value Date/Time   NA 141 07/05/2014 0516   NA 138 07/02/2014 0448   K 3.4* 07/05/2014 0516   K 4.5 07/02/2014 0448   CL 104 07/05/2014 0516   CL 102 07/02/2014 0448   CO2 26 07/05/2014 0516   CO2 26 07/02/2014 0448   BUN 12 07/05/2014 0516   BUN 11 07/02/2014 0448   CREATININE 0.75 07/05/2014 0516   CREATININE 1.04 07/02/2014 0448   GLUCOSE 115* 07/05/2014 0516   GLUCOSE 161* 07/02/2014 0448   CALCIUM 8.1* 07/05/2014 0516   CALCIUM 7.9* 07/02/2014 0448   AST 25 06/25/2014 1445   AST 24 06/18/2014 0910   ALT 33 06/25/2014 1445   ALT 26 06/18/2014 0910   ALKPHOS 69 06/25/2014 1445   ALKPHOS 66 06/18/2014 0910   BILITOT 0.7 06/25/2014 1445   BILITOT 0.5 06/18/2014 0910   PROT 7.6 06/25/2014 1445   PROT 7.4 06/18/2014 0910   ALBUMIN 4.2 06/25/2014 1445   ALBUMIN 4.0 06/18/2014 0910     Studies/Results: No results  found.  Anti-infectives: Anti-infectives   Start     Dose/Rate Route Frequency Ordered Stop   07/01/14 2000  cefoTEtan (CEFOTAN) 2 g in dextrose 5 % 50 mL IVPB     2 g 100 mL/hr over 30 Minutes Intravenous Every 12 hours 07/01/14 1339 07/01/14 2015   07/01/14 0625  cefoTEtan in Dextrose 5% (CEFOTAN) IVPB 2 g  Status:  Discontinued     2 g Intravenous Every 12 hours 07/01/14 0625 07/01/14 1413      Assessment Principal Problem:   Sigmoid diverticulitis-recurrent s/p lap assisted subtotal colectomy:  Postop ileus continues.  Pathology c/w diverticular disease-discussed with him.  ABL anemia-slight drop in Hgb.    LOS: 4 days   Plan: Increase ambulation. Wait for ileus to resolve.   Cadi Rhinehart Shela Commons 07/05/2014

## 2014-07-06 LAB — CBC
HCT: 27.5 % — ABNORMAL LOW (ref 39.0–52.0)
Hemoglobin: 9.5 g/dL — ABNORMAL LOW (ref 13.0–17.0)
MCH: 30.6 pg (ref 26.0–34.0)
MCHC: 34.5 g/dL (ref 30.0–36.0)
MCV: 88.7 fL (ref 78.0–100.0)
PLATELETS: 345 10*3/uL (ref 150–400)
RBC: 3.1 MIL/uL — AB (ref 4.22–5.81)
RDW: 13.3 % (ref 11.5–15.5)
WBC: 16.6 10*3/uL — AB (ref 4.0–10.5)

## 2014-07-06 MED ORDER — PANTOPRAZOLE SODIUM 40 MG PO TBEC
40.0000 mg | DELAYED_RELEASE_TABLET | Freq: Every day | ORAL | Status: DC
  Administered 2014-07-06 – 2014-07-07 (×2): 40 mg via ORAL
  Filled 2014-07-06 (×4): qty 1

## 2014-07-06 NOTE — Progress Notes (Signed)
This patient is receiving IV Protonix. Based on criteria approved by the Pharmacy and Therapeutics Committee, this medication is being converted to the equivalent oral dose form. These criteria include: . The patient is eating (either orally or per tube) and/or has been taking other orally administered medications for at least 24 hours. . This patient has no evidence of active gastrointestinal bleeding or impaired GI absorption (gastrectomy, short bowel, patient on TNA or NPO).  If you have questions about this conversion, please contact the pharmacy department. Thank you.  Clance Boll, PharmD, BCPS Pager: [phone] 07/06/2014 12:25 PM

## 2014-07-06 NOTE — Progress Notes (Signed)
5 Days Post-Op  Subjective: Feels better today.  Some liquid BMs.  No nausea.  Walking.  Voiding well.  Objective: Vital signs in last 24 hours: Temp:  [98 F (36.7 C)-99.1 F (37.3 C)] 99.1 F (37.3 C) (09/27 0522) Pulse Rate:  [68-72] 72 (09/27 0522) Resp:  [16-24] 24 (09/27 0720) BP: (131-146)/(78-90) 142/78 mmHg (09/27 0522) SpO2:  [94 %-100 %] 97 % (09/27 0720) FiO2 (%):  [40 %-42 %] 42 % (09/27 0000) Last BM Date:  (pta)  Intake/Output from previous day: 09/26 0701 - 09/27 0700 In: 3027.3 [P.O.:600; I.V.:2427.3] Out: 72 [Drains:85] Intake/Output this shift:    PE: General- In NAD Abdomen-soft, incisions are clean and intact, serous drain output, active bowel sounds  Lab Results:   Recent Labs  07/05/14 0516 07/06/14 0445  WBC 15.2* 16.6*  HGB 9.1* 9.5*  HCT 26.4* 27.5*  PLT 308 345   BMET  Recent Labs  07/05/14 0516  NA 141  K 3.4*  CL 104  CO2 26  GLUCOSE 115*  BUN 12  CREATININE 0.75  CALCIUM 8.1*   PT/INR No results found for this basename: LABPROT, INR,  in the last 72 hours Comprehensive Metabolic Panel:    Component Value Date/Time   NA 141 07/05/2014 0516   NA 138 07/02/2014 0448   K 3.4* 07/05/2014 0516   K 4.5 07/02/2014 0448   CL 104 07/05/2014 0516   CL 102 07/02/2014 0448   CO2 26 07/05/2014 0516   CO2 26 07/02/2014 0448   BUN 12 07/05/2014 0516   BUN 11 07/02/2014 0448   CREATININE 0.75 07/05/2014 0516   CREATININE 1.04 07/02/2014 0448   GLUCOSE 115* 07/05/2014 0516   GLUCOSE 161* 07/02/2014 0448   CALCIUM 8.1* 07/05/2014 0516   CALCIUM 7.9* 07/02/2014 0448   AST 25 06/25/2014 1445   AST 24 06/18/2014 0910   ALT 33 06/25/2014 1445   ALT 26 06/18/2014 0910   ALKPHOS 69 06/25/2014 1445   ALKPHOS 66 06/18/2014 0910   BILITOT 0.7 06/25/2014 1445   BILITOT 0.5 06/18/2014 0910   PROT 7.6 06/25/2014 1445   PROT 7.4 06/18/2014 0910   ALBUMIN 4.2 06/25/2014 1445   ALBUMIN 4.0 06/18/2014 0910     Studies/Results: No results  found.  Anti-infectives: Anti-infectives   Start     Dose/Rate Route Frequency Ordered Stop   07/01/14 2000  cefoTEtan (CEFOTAN) 2 g in dextrose 5 % 50 mL IVPB     2 g 100 mL/hr over 30 Minutes Intravenous Every 12 hours 07/01/14 1339 07/01/14 2015   07/01/14 0625  cefoTEtan in Dextrose 5% (CEFOTAN) IVPB 2 g  Status:  Discontinued     2 g Intravenous Every 12 hours 07/01/14 0625 07/01/14 1413      Assessment Principal Problem:   Sigmoid diverticulitis-recurrent s/p lap assisted subtotal colectomy:  bowel function slowly returning; has a mild leukocytosis without obvious evidence of infection or leak    LOS: 5 days   Plan: Full liquids today.   [name] [name] Shela Commons 07/06/2014

## 2014-07-07 ENCOUNTER — Inpatient Hospital Stay (HOSPITAL_COMMUNITY): Payer: BC Managed Care – PPO

## 2014-07-07 ENCOUNTER — Encounter (HOSPITAL_COMMUNITY): Payer: Self-pay | Admitting: Radiology

## 2014-07-07 LAB — URINALYSIS, ROUTINE W REFLEX MICROSCOPIC
Bilirubin Urine: NEGATIVE
GLUCOSE, UA: NEGATIVE mg/dL
Hgb urine dipstick: NEGATIVE
Ketones, ur: NEGATIVE mg/dL
LEUKOCYTES UA: NEGATIVE
Nitrite: NEGATIVE
PROTEIN: NEGATIVE mg/dL
Urobilinogen, UA: 0.2 mg/dL (ref 0.0–1.0)
pH: 6 (ref 5.0–8.0)

## 2014-07-07 LAB — CBC
HCT: 30.5 % — ABNORMAL LOW (ref 39.0–52.0)
HEMOGLOBIN: 10.5 g/dL — AB (ref 13.0–17.0)
MCH: 30.4 pg (ref 26.0–34.0)
MCHC: 34.4 g/dL (ref 30.0–36.0)
MCV: 88.4 fL (ref 78.0–100.0)
Platelets: 408 10*3/uL — ABNORMAL HIGH (ref 150–400)
RBC: 3.45 MIL/uL — AB (ref 4.22–5.81)
RDW: 13.4 % (ref 11.5–15.5)
WBC: 22.6 10*3/uL — ABNORMAL HIGH (ref 4.0–10.5)

## 2014-07-07 LAB — BASIC METABOLIC PANEL
Anion gap: 12 (ref 5–15)
BUN: 11 mg/dL (ref 6–23)
CHLORIDE: 101 meq/L (ref 96–112)
CO2: 26 mEq/L (ref 19–32)
Calcium: 8.3 mg/dL — ABNORMAL LOW (ref 8.4–10.5)
Creatinine, Ser: 0.72 mg/dL (ref 0.50–1.35)
GFR calc Af Amer: >90 mL/min (ref >90)
GLUCOSE: 122 mg/dL — AB (ref 70–99)
POTASSIUM: 3.5 meq/L — AB (ref 3.7–5.3)
SODIUM: 139 meq/L (ref 137–147)

## 2014-07-07 LAB — LIPASE, BLOOD: LIPASE: 52 U/L (ref 11–59)

## 2014-07-07 MED ORDER — IOHEXOL 300 MG/ML  SOLN
100.0000 mL | Freq: Once | INTRAMUSCULAR | Status: AC | PRN
  Administered 2014-07-07: 100 mL via INTRAVENOUS

## 2014-07-07 MED ORDER — IOHEXOL 300 MG/ML  SOLN
25.0000 mL | INTRAMUSCULAR | Status: AC
  Administered 2014-07-07 (×2): 25 mL via ORAL

## 2014-07-07 MED ORDER — PIPERACILLIN-TAZOBACTAM 3.375 G IVPB
3.3750 g | Freq: Three times a day (TID) | INTRAVENOUS | Status: DC
  Administered 2014-07-07 – 2014-07-10 (×9): 3.375 g via INTRAVENOUS
  Filled 2014-07-07 (×11): qty 50

## 2014-07-07 MED ORDER — MORPHINE SULFATE 2 MG/ML IJ SOLN
2.0000 mg | INTRAMUSCULAR | Status: DC | PRN
  Administered 2014-07-07: 4 mg via INTRAVENOUS
  Administered 2014-07-07: 2 mg via INTRAVENOUS
  Administered 2014-07-07: 4 mg via INTRAVENOUS
  Administered 2014-07-07 – 2014-07-08 (×3): 2 mg via INTRAVENOUS
  Filled 2014-07-07: qty 2
  Filled 2014-07-07 (×3): qty 1
  Filled 2014-07-07: qty 2
  Filled 2014-07-07: qty 1

## 2014-07-07 MED ORDER — KCL-LACTATED RINGERS-D5W 20 MEQ/L IV SOLN
INTRAVENOUS | Status: AC
  Administered 2014-07-07 – 2014-07-08 (×3): via INTRAVENOUS
  Filled 2014-07-07 (×5): qty 1000

## 2014-07-07 NOTE — Progress Notes (Signed)
6 Days Post-Op  Subjective: Having multiple loose BMs.  Pain and nausea are better.  Walking. No dyspnea.  No dysuria.  Objective: Vital signs in last 24 hours: Temp:  [97.8 F (36.6 C)-99 F (37.2 C)] 99 F (37.2 C) (09/28 0512) Pulse Rate:  [64-74] 65 (09/28 0512) Resp:  [18-99] 20 (09/28 0750) BP: (134-146)/(88-94) 146/92 mmHg (09/28 0512) SpO2:  [96 %-98 %] 96 % (09/28 0750) FiO2 (%):  [39 %-40 %] 39 % (09/28 0436) Last BM Date: 07/06/14  Intake/Output from previous day: 09/27 0701 - 09/28 0700 In: 2319.3 [P.O.:360; I.V.:1959.3] Out: 580 [Drains:580] Intake/Output this shift: Total I/O In: -  Out: 45 [Drains:45]  PE: General- In NAD Lungs-clear Abdomen-soft, incisions are clean and intact, serous drain output with no foul odor, a Extr-no calf swelling or edema Lab Results:   Recent Labs  07/06/14 0445 07/07/14 0435  WBC 16.6* 22.6*  HGB 9.5* 10.5*  HCT 27.5* 30.5*  PLT 345 408*   BMET  Recent Labs  07/05/14 0516  NA 141  K 3.4*  CL 104  CO2 26  GLUCOSE 115*  BUN 12  CREATININE 0.75  CALCIUM 8.1*   PT/INR No results found for this basename: LABPROT, INR,  in the last 72 hours Comprehensive Metabolic Panel:    Component Value Date/Time   NA 141 07/05/2014 0516   NA 138 07/02/2014 0448   K 3.4* 07/05/2014 0516   K 4.5 07/02/2014 0448   CL 104 07/05/2014 0516   CL 102 07/02/2014 0448   CO2 26 07/05/2014 0516   CO2 26 07/02/2014 0448   BUN 12 07/05/2014 0516   BUN 11 07/02/2014 0448   CREATININE 0.75 07/05/2014 0516   CREATININE 1.04 07/02/2014 0448   GLUCOSE 115* 07/05/2014 0516   GLUCOSE 161* 07/02/2014 0448   CALCIUM 8.1* 07/05/2014 0516   CALCIUM 7.9* 07/02/2014 0448   AST 25 06/25/2014 1445   AST 24 06/18/2014 0910   ALT 33 06/25/2014 1445   ALT 26 06/18/2014 0910   ALKPHOS 69 06/25/2014 1445   ALKPHOS 66 06/18/2014 0910   BILITOT 0.7 06/25/2014 1445   BILITOT 0.5 06/18/2014 0910   PROT 7.6 06/25/2014 1445   PROT 7.4 06/18/2014 0910   ALBUMIN 4.2 06/25/2014  1445   ALBUMIN 4.0 06/18/2014 0910     Studies/Results: No results found.  Anti-infectives: Anti-infectives   Start     Dose/Rate Route Frequency Ordered Stop   07/01/14 2000  cefoTEtan (CEFOTAN) 2 g in dextrose 5 % 50 mL IVPB     2 g 100 mL/hr over 30 Minutes Intravenous Every 12 hours 07/01/14 1339 07/01/14 2015   07/01/14 0625  cefoTEtan in Dextrose 5% (CEFOTAN) IVPB 2 g  Status:  Discontinued     2 g Intravenous Every 12 hours 07/01/14 0625 07/01/14 1413      Assessment Principal Problem:   Sigmoid diverticulitis-recurrent s/p lap assisted subtotal colectomy:  bowel function returning; has an increasing leukocytosis without obvious evidence of infection or leak    LOS: 6 days   Plan: Empiric antibiotics.  Discontinue PCA.  Check UA.  CT scan of abdomen and pelvis to evaluate for post infection.   Miasia Crabtree Shela Commons 07/07/2014

## 2014-07-08 LAB — GLUCOSE, CAPILLARY
GLUCOSE-CAPILLARY: 120 mg/dL — AB (ref 70–99)
GLUCOSE-CAPILLARY: 138 mg/dL — AB (ref 70–99)

## 2014-07-08 LAB — CBC
HCT: 31.2 % — ABNORMAL LOW (ref 39.0–52.0)
HEMOGLOBIN: 10.8 g/dL — AB (ref 13.0–17.0)
MCH: 30.1 pg (ref 26.0–34.0)
MCHC: 34.6 g/dL (ref 30.0–36.0)
MCV: 86.9 fL (ref 78.0–100.0)
Platelets: 509 10*3/uL — ABNORMAL HIGH (ref 150–400)
RBC: 3.59 MIL/uL — ABNORMAL LOW (ref 4.22–5.81)
RDW: 13.3 % (ref 11.5–15.5)
WBC: 20.9 10*3/uL — ABNORMAL HIGH (ref 4.0–10.5)

## 2014-07-08 MED ORDER — PROMETHAZINE HCL 25 MG/ML IJ SOLN
12.5000 mg | INTRAMUSCULAR | Status: DC | PRN
  Administered 2014-07-08 – 2014-07-09 (×2): 12.5 mg via INTRAVENOUS
  Filled 2014-07-08 (×2): qty 1

## 2014-07-08 MED ORDER — TRACE MINERALS CR-CU-F-FE-I-MN-MO-SE-ZN IV SOLN
INTRAVENOUS | Status: AC
  Administered 2014-07-08: 17:00:00 via INTRAVENOUS
  Filled 2014-07-08: qty 1000

## 2014-07-08 MED ORDER — INSULIN ASPART 100 UNIT/ML ~~LOC~~ SOLN
0.0000 [IU] | Freq: Four times a day (QID) | SUBCUTANEOUS | Status: DC
  Administered 2014-07-08 – 2014-07-09 (×2): 1 [IU] via SUBCUTANEOUS
  Administered 2014-07-10: 2 [IU] via SUBCUTANEOUS
  Administered 2014-07-10 – 2014-07-11 (×5): 1 [IU] via SUBCUTANEOUS
  Administered 2014-07-12: 2 [IU] via SUBCUTANEOUS
  Administered 2014-07-12 – 2014-07-15 (×10): 1 [IU] via SUBCUTANEOUS

## 2014-07-08 MED ORDER — VANCOMYCIN HCL IN DEXTROSE 1-5 GM/200ML-% IV SOLN
1000.0000 mg | Freq: Two times a day (BID) | INTRAVENOUS | Status: DC
  Administered 2014-07-08 – 2014-07-09 (×3): 1000 mg via INTRAVENOUS
  Filled 2014-07-08 (×4): qty 200

## 2014-07-08 MED ORDER — PANTOPRAZOLE SODIUM 40 MG IV SOLR
40.0000 mg | Freq: Every day | INTRAVENOUS | Status: DC
  Administered 2014-07-08 – 2014-07-20 (×13): 40 mg via INTRAVENOUS
  Filled 2014-07-08 (×14): qty 40

## 2014-07-08 MED ORDER — KCL-LACTATED RINGERS-D5W 20 MEQ/L IV SOLN
INTRAVENOUS | Status: AC
  Administered 2014-07-08 – 2014-07-09 (×2): via INTRAVENOUS
  Filled 2014-07-08 (×2): qty 1000

## 2014-07-08 MED ORDER — FAT EMULSION 20 % IV EMUL
250.0000 mL | INTRAVENOUS | Status: AC
  Administered 2014-07-08: 250 mL via INTRAVENOUS
  Filled 2014-07-08: qty 250

## 2014-07-08 MED ORDER — SODIUM CHLORIDE 0.9 % IJ SOLN
10.0000 mL | INTRAMUSCULAR | Status: DC | PRN
  Administered 2014-07-09 – 2014-07-22 (×12): 10 mL

## 2014-07-08 MED ORDER — VANCOMYCIN HCL 10 G IV SOLR
2000.0000 mg | Freq: Once | INTRAVENOUS | Status: AC
  Administered 2014-07-08: 2000 mg via INTRAVENOUS
  Filled 2014-07-08: qty 2000

## 2014-07-08 MED ORDER — KETOROLAC TROMETHAMINE 30 MG/ML IJ SOLN
30.0000 mg | Freq: Four times a day (QID) | INTRAMUSCULAR | Status: AC | PRN
  Administered 2014-07-08 – 2014-07-11 (×10): 30 mg via INTRAVENOUS
  Filled 2014-07-08 (×11): qty 1

## 2014-07-08 NOTE — Progress Notes (Signed)
PARENTERAL NUTRITION CONSULT NOTE - INITIAL  Pharmacy Consult for TNA Indication: Prolonged ileus  Allergies  Allergen Reactions  . Celebrex [Celecoxib]     Mother is allergic, so I do not take it  . Sulfa Antibiotics Nausea And Vomiting  . Codeine Rash  . Dilaudid [Hydromorphone Hcl] Nausea And Vomiting and Anxiety    Patient Measurements: Height: 6' (182.9 cm) Weight: 227 lb 15.3 oz (103.4 kg) IBW/kg (Calculated) : 77.6  Vital Signs: Temp: 98.3 F (36.8 C) (09/29 0546) Temp src: Oral (09/29 0546) BP: 118/77 mmHg (09/29 0546) Pulse Rate: 62 (09/29 0546) Intake/Output from previous day: 09/28 0701 - 09/29 0700 In: 2698.3 [I.V.:2698.3] Out: 965 [Emesis/NG output:600; Drains:365] Intake/Output from this shift:    Labs:  Recent Labs  07/06/14 0445 07/07/14 0435 07/08/14 0020  WBC 16.6* 22.6* 20.9*  HGB 9.5* 10.5* 10.8*  HCT 27.5* 30.5* 31.2*  PLT 345 408* 509*     Recent Labs  07/07/14 0745  NA 139  K 3.5*  CL 101  CO2 26  GLUCOSE 122*  BUN 11  CREATININE 0.72  CALCIUM 8.3*   Estimated Creatinine Clearance: 143.4 ml/min (by C-G formula based on Cr of 0.72).   No results found for this basename: GLUCAP,  in the last 72 hours  Medical History: Past Medical History  Diagnosis Date  . Hypertension   . Sarcoidosis   . Hyperlipemia   . Diverticulitis   . Allergy     SEASONAL  . Hypercholesteremia 06/15/2012  . 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 enoxaparin (LOVENOX) injection  40 mg Subcutaneous Q24H  . insulin aspart  0-9 Units Subcutaneous 4 times per day  . pantoprazole  40 mg Oral QHS  . piperacillin-tazobactam (ZOSYN)  IV  3.375 g Intravenous Q8H  . vancomycin  2,000 mg Intravenous Once  . vancomycin  1,000 mg Intravenous Q12H   Infusions:  . dextrose 5% lactated ringers with KCl 20 mEq/L 100 mL/hr at 07/08/14 0854  . dextrose 5% lactated ringers with KCl 20 mEq/L      Insulin  Requirements: none since admisiion  Current Nutrition: clear liquid diet  IVF: D5 in LR w/3meq/L @100ml /hr  Central access: IV team to place PICC TPN start date: 9/29  ASSESSMENT                                                                                                          HPI:  Sigmoid diverticulitis-recurrent s/p lap assisted subtotal colectomy:still has some ileus clinically and by CT   Significant events:  9/29: Had some crampy upper abdominal pain then nausea and vomiting-basically everything he ate yesterday  Today:    Glucose - 122  Electrolytes -K 3.5, all other WNL  Renal -WNL  LFTs -  TGs -  Prealbumin -  NUTRITIONAL GOALS  RD recs: pending Clinimix / at a goal rate of ml/hr + 20% fat emulsion at ml/hr to provide: g/day protein, Kcal/day.  PLAN                                                                                                                         At 1800 today:  Start Clinimix E 5/15 at 7340ml/hr.  20% fat emulsion at 4910ml/hr.  Plan to advance as tolerated to the goal rate.  TNA to contain standard multivitamins and trace elements.  Reduce IVF to 1450ml/hr.  Add SSI .   TNA lab panels on Mondays & Thursdays.  F/u daily.   Arley Phenixllen Breunna Nordmann RPh 07/08/2014, 11:20 AM Pager [phone]

## 2014-07-08 NOTE — Progress Notes (Signed)
ANTIBIOTIC CONSULT NOTE - INITIAL  Pharmacy Consult for Vancomycin Indication: pneumonia  Allergies  Allergen Reactions  . Celebrex [Celecoxib]     Mother is allergic, so I do not take it  . Sulfa Antibiotics Nausea And Vomiting  . Codeine Rash  . Dilaudid [Hydromorphone Hcl] Nausea And Vomiting and Anxiety    Patient Measurements: Height: 6' (182.9 cm) Weight: 227 lb 15.3 oz (103.4 kg) IBW/kg (Calculated) : 77.6   Vital Signs: Temp: 98.3 F (36.8 C) (09/29 0546) Temp src: Oral (09/29 0546) BP: 118/77 mmHg (09/29 0546) Pulse Rate: 62 (09/29 0546) Intake/Output from previous day: 09/28 0701 - 09/29 0700 In: 2698.3 [I.V.:2698.3] Out: 965 [Emesis/NG output:600; Drains:365] Intake/Output from this shift:    Labs:  Recent Labs  07/06/14 0445 07/07/14 0435 07/07/14 0745 07/08/14 0020  WBC 16.6* 22.6*  --  20.9*  HGB 9.5* 10.5*  --  10.8*  PLT 345 408*  --  509*  CREATININE  --   --  0.72  --    Estimated Creatinine Clearance: 143.4 ml/min (by C-G formula based on Cr of 0.72). No results found for this basename: VANCOTROUGH, VANCOPEAK, VANCORANDOM, GENTTROUGH, GENTPEAK, GENTRANDOM, TOBRATROUGH, TOBRAPEAK, TOBRARND, AMIKACINPEAK, AMIKACINTROU, AMIKACIN,  in the last 72 hours   Microbiology: No results found for this or any previous visit (from the past 720 hour(s)).  Medical History: Past Medical History  Diagnosis Date  . Hypertension   . Sarcoidosis   . Hyperlipemia   . Diverticulitis   . Allergy     SEASONAL  . Hypercholesteremia 06/15/2012  . 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 enoxaparin (LOVENOX) injection  40 mg Subcutaneous Q24H  . pantoprazole  40 mg Oral QHS  . piperacillin-tazobactam (ZOSYN)  IV  3.375 g Intravenous Q8H  . vancomycin  2,000 mg Intravenous Once   Infusions:  . dextrose 5% lactated ringers with KCl 20 mEq/L 125 mL/hr at 07/07/14 2228   Assessment: Sigmoid  diverticulitis-recurrent s/p lap assisted subtotal colectomy:still has some ileus clinically and by CT . Leukocytosis-slightly improved on Zosyn; no obvious intraabdominal abscess on CT; some consolidation in left lower lobe of lung-?PNA.  Pharmacy consulted to dose Vancomycin   Goal of Therapy:  Eradication of infection Vancomycin per renal function  Plan:   Vanc 2000mg  IV x 1 then 1000mg  q12h  Follow renal function and course of therapy  Vanc trough as needed   Arley Phenixllen Tristen Pennino RPh 07/08/2014, 8:07 AM Pager [phone]

## 2014-07-08 NOTE — Progress Notes (Signed)
7 Days Post-Op  Subjective: Had a rough night.  Had some crampy upper abdominal pain then nausea and vomiting-basically everything he ate yesterday.  Did not sleep well.  Decreased BMs.  Coughing some. Objective: Vital signs in last 24 hours: Temp:  [98.1 F (36.7 C)-98.6 F (37 C)] 98.3 F (36.8 C) (09/29 0546) Pulse Rate:  [62-68] 62 (09/29 0546) Resp:  [18-20] 18 (09/29 0546) BP: (118-139)/(76-82) 118/77 mmHg (09/29 0546) SpO2:  [96 %-100 %] 100 % (09/29 0546) Last BM Date: 07/07/14  Intake/Output from previous day: 09/28 0701 - 09/29 0700 In: 2698.3 [I.V.:2698.3] Out: 965 [Emesis/NG output:600; Drains:365] Intake/Output this shift:    PE: General- In NAD Abdomen-soft, incisions are clean and intact, serous drain output with no foul odor, decreased bowel sounds Lab Results:   Recent Labs  07/07/14 0435 07/08/14 0020  WBC 22.6* 20.9*  HGB 10.5* 10.8*  HCT 30.5* 31.2*  PLT 408* 509*   BMET  Recent Labs  07/07/14 0745  NA 139  K 3.5*  CL 101  CO2 26  GLUCOSE 122*  BUN 11  CREATININE 0.72  CALCIUM 8.3*   PT/INR No results found for this basename: LABPROT, INR,  in the last 72 hours Comprehensive Metabolic Panel:    Component Value Date/Time   NA 139 07/07/2014 0745   NA 141 07/05/2014 0516   K 3.5* 07/07/2014 0745   K 3.4* 07/05/2014 0516   CL 101 07/07/2014 0745   CL 104 07/05/2014 0516   CO2 26 07/07/2014 0745   CO2 26 07/05/2014 0516   BUN 11 07/07/2014 0745   BUN 12 07/05/2014 0516   CREATININE 0.72 07/07/2014 0745   CREATININE 0.75 07/05/2014 0516   GLUCOSE 122* 07/07/2014 0745   GLUCOSE 115* 07/05/2014 0516   CALCIUM 8.3* 07/07/2014 0745   CALCIUM 8.1* 07/05/2014 0516   AST 25 06/25/2014 1445   AST 24 06/18/2014 0910   ALT 33 06/25/2014 1445   ALT 26 06/18/2014 0910   ALKPHOS 69 06/25/2014 1445   ALKPHOS 66 06/18/2014 0910   BILITOT 0.7 06/25/2014 1445   BILITOT 0.5 06/18/2014 0910   PROT 7.6 06/25/2014 1445   PROT 7.4 06/18/2014 0910   ALBUMIN 4.2 06/25/2014  1445   ALBUMIN 4.0 06/18/2014 0910     Studies/Results: Ct Abdomen Pelvis W Contrast  07/07/2014   CLINICAL DATA:  Elevated white blood cell count following subtotal colectomy  EXAM: CT ABDOMEN AND PELVIS WITH CONTRAST  TECHNIQUE: Multidetector CT imaging of the abdomen and pelvis was performed using the standard protocol following bolus administration of intravenous contrast.  CONTRAST:  OMNIPAQUE IOHEXOL 300 MG/ML  SOLN  COMPARISON:  03/24/2014  FINDINGS: Lung bases demonstrate small pleural effusion and mild left lower lobe atelectasis.  The liver again demonstrates some scattered hypodensities likely representing cysts. The spleen, gallbladder, adrenal glands and kidneys are normal in their CT appearance. Delayed images show excretion of contrast material bilaterally. A few small left renal cysts are again noted.  There are changes noted consistent with the history of subtotal colectomy. A surgical drain is noted in the left lower quadrant. There are multiple dilated loops of small bowel identified both within the jejunum and ileum. There are multiple loops of what appear to be proximal ileum which demonstrate significant wall edema and mesenteric stranding. Additionally and irregular fluid collection is noted adjacent to the pancreas and extending inferiorly along mesenteric its largest peripancreatic component measures approximately 5.7 x 4.6 cm. Inferiorly anterior to the aorta it  measures approximately 2.3 x 4.9 cm. It is possible this represents a postoperative fluid collection with the possibility of pancreatitis could not be totally excluded based on the appearance. Correlation with laboratory values is recommended. This may be in part the etiology of the inflammatory changes surrounding the small bowel and subsequent small bowel dilatation. No definitive transition point to correspond with the degree of dilatation is noted.  The bladder is well distended. No acute osseous abnormality is seen.   IMPRESSION: Postoperative changes as described.  There is a fluid collection adjacent to the pancreas which extends inferiorly as described. This is suspicious for pancreatitis with subsequent phlegmon. Correlation with lipase level is recommended. If this is relatively normal this may simply represent a postoperative fluid collection which is not organized into an abscess this time. It is not approach pole [facility] percutaneous technique at this time due to the overlying bowel.  Multiple dilated loops of small bowel which may simply represent postoperative ileus although some edematous small bowel is identified near the junction of the jejunum and ileum although this may be reactive due to the postoperative fluid collection and inflammatory change.  These results were discussed by telephone at the time of interpretation on 07/07/2014 at 12:08 pm to Dr. Avel PeaceDD Kristoffer Bala , who verbally acknowledged these results.   Electronically Signed   By: Alcide CleverMark  Lukens M.D.   On: 07/07/2014 12:08    Anti-infectives: Anti-infectives   Start     Dose/Rate Route Frequency Ordered Stop   07/07/14 1000  piperacillin-tazobactam (ZOSYN) IVPB 3.375 g     3.375 g 12.5 mL/hr over 240 Minutes Intravenous Every 8 hours 07/07/14 0821     07/01/14 2000  cefoTEtan (CEFOTAN) 2 g in dextrose 5 % 50 mL IVPB     2 g 100 mL/hr over 30 Minutes Intravenous Every 12 hours 07/01/14 1339 07/01/14 2015   07/01/14 0625  cefoTEtan in Dextrose 5% (CEFOTAN) IVPB 2 g  Status:  Discontinued     2 g Intravenous Every 12 hours 07/01/14 0625 07/01/14 1413      Assessment Principal Problem:  1. Sigmoid diverticulitis-recurrent s/p lap assisted subtotal colectomy:still has some ileus clinically and by CT   2.  Leukocytosis-slightly improved on Zosyn; no obvious intraabdominal abscess on CT; some consolidation in left lower lobe of lung-?PNA.     LOS: 7 days   Plan: PICC and TPN.  Add Vancomycin for possible PNA.  Decrease diet.   [name] [name]  Shela CommonsJ 07/08/2014

## 2014-07-08 NOTE — Progress Notes (Signed)
INITIAL NUTRITION ASSESSMENT  DOCUMENTATION CODES Per approved criteria  -Obesity Unspecified   INTERVENTION: - TPN per pharmacy - Resource Breeze PRN as nausea improves, do not want concentrated sugar from nutritional supplements to exacerbate nausea - Diet advancement per MD - RD to continue to monitor   NUTRITION DIAGNOSIS: Inadequate oral intake related to clear liquid diet as evidenced by diet order.    Goal: 1. TPN to meet >90% of estimated nutritional needs 2. Advance diet as tolerated to soft diet  Monitor:  Weights, labs, diet advancement, TPN  Reason for Assessment: Consult for TPN  48 y.o. male  Admitting Dx: Sigmoid diverticulitis  ASSESSMENT: Pt with recurrent sigmoid diverticulitis, admitted for subtotal colectomy which was performed 9/22. Developed postop ileus. Had some crampy upper abdominal pain then nausea and vomiting-basically everything he ate yesterday per MD. Scheduled to start TPN today.   - Met with pt who reports recently his appetite has been down with pt consuming 50% of usual intake of 2-3 well balanced meals/day - Denies any significant changes in weight, says it fluctuates between 10-12 pounds - C/o "hard" burping, 7-8 times/day with his stomach feeling more bloated - Thinks he's having some nausea from gas - notified RN - Performed nutrition focused physical exam which was WNL   Height: Ht Readings from Last 1 Encounters:  07/01/14 6' (1.829 m)    Weight: Wt Readings from Last 1 Encounters:  07/01/14 227 lb 15.3 oz (103.4 kg)    Ideal Body Weight: 178 lbs  % Ideal Body Weight: 127%  Wt Readings from Last 10 Encounters:  07/01/14 227 lb 15.3 oz (103.4 kg)  07/01/14 227 lb 15.3 oz (103.4 kg)  06/25/14 223 lb (101.152 kg)  06/02/14 225 lb (102.059 kg)  04/09/14 223 lb 3.2 oz (101.243 kg)  08/22/12 226 lb 3.2 oz (102.604 kg)  10/20/11 232 lb (105.235 kg)  10/12/11 232 lb 3.2 oz (105.325 kg)    Usual Body Weight: 225 lbs last  month  % Usual Body Weight: 101%  BMI:  Body mass index is 30.91 kg/(m^2). Class I obesity   Estimated Nutritional Needs: Kcal: 3582-5189 Protein: 120-140g Fluid: 1.9-2.2L/day   Skin: surgical incision on abdomen  Diet Order: Clear Liquid  EDUCATION NEEDS: -No education needs identified at this time   Intake/Output Summary (Last 24 hours) at 07/08/14 1421 Last data filed at 07/08/14 1000  Gross per 24 hour  Intake   2000 ml  Output    770 ml  Net   1230 ml    Last BM: 9/28  Labs:   Recent Labs Lab 07/02/14 0448 07/05/14 0516 07/07/14 0745  NA 138 141 139  K 4.5 3.4* 3.5*  CL 102 104 101  CO2 26 26 26   BUN 11 12 11   CREATININE 1.04 0.75 0.72  CALCIUM 7.9* 8.1* 8.3*  GLUCOSE 161* 115* 122*    CBG (last 3)  No results found for this basename: GLUCAP,  in the last 72 hours  Scheduled Meds: . enoxaparin (LOVENOX) injection  40 mg Subcutaneous Q24H  . insulin aspart  0-9 Units Subcutaneous 4 times per day  . pantoprazole  40 mg Oral QHS  . piperacillin-tazobactam (ZOSYN)  IV  3.375 g Intravenous Q8H  . vancomycin  1,000 mg Intravenous Q12H    Continuous Infusions: . dextrose 5% lactated ringers with KCl 20 mEq/L 100 mL/hr at 07/08/14 0854  . dextrose 5% lactated ringers with KCl 20 mEq/L    . Marland KitchenTPN (CLINIMIX-E) Adult  And  . fat emulsion      Past Medical History  Diagnosis Date  . Hypertension   . Sarcoidosis   . Hyperlipemia   . Diverticulitis   . Allergy     SEASONAL  . Hypercholesteremia 06/15/2012  . 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 Vertebral artery stenting  2008  . Laparoscopic partial colectomy N/A 07/01/2014    Procedure: LAPAROSCOPIC ASSISTED  SUBTOTAL COLECTOMY INCIDENTAL APPENDECTOMY, PARTIAL OMENTECTOMY AND RIGID PROCTOSCOPY;  Surgeon: Jackolyn Confer, MD;  Location: [facility];  Service: General;  Laterality: N/A;    Carlis Stable MS, [place],  LDN [phone] Pager [phone] Weekend/After Hours Pager

## 2014-07-08 NOTE — Progress Notes (Signed)
Peripherally Inserted Central Catheter/Midline Placement  The IV Nurse has discussed with the patient and/or persons authorized to consent for the patient, the purpose of this procedure and the potential benefits and risks involved with this procedure.  The benefits include less needle sticks, lab draws from the catheter and patient may be discharged home with the catheter.  Risks include, but not limited to, infection, bleeding, blood clot (thrombus formation), and puncture of an artery; nerve damage and irregular heat beat.  Alternatives to this procedure were also discussed.  PICC/Midline Placement Documentation  PICC / Midline Double Lumen 07/08/14 PICC Right Basilic 43 cm 0 cm (Active)  Indication for Insertion or Continuance of Line Administration of hyperosmolar/irritating solutions (i.e. TPN, Vancomycin, etc.) 07/08/2014  2:34 PM  Exposed Catheter (cm) 0 cm 07/08/2014  2:34 PM  Site Assessment Clean;Intact;Dry 07/08/2014  2:34 PM  Lumen #1 Status Flushed;Saline locked;Blood return noted 07/08/2014  2:34 PM  Lumen #2 Status Flushed;Saline locked;Blood return noted 07/08/2014  2:34 PM  Dressing Type Transparent 07/08/2014  2:34 PM  Dressing Status Clean;Dry;Intact;Antimicrobial disc in place 07/08/2014  2:34 PM  Dressing Change Due 07/15/14 07/08/2014  2:34 PM       Angell Pincock, Lajean ManesKerry Loraine 07/08/2014, 2:34 PM

## 2014-07-09 LAB — COMPREHENSIVE METABOLIC PANEL
ALBUMIN: 2.2 g/dL — AB (ref 3.5–5.2)
ALT: 71 U/L — ABNORMAL HIGH (ref 0–53)
AST: 43 U/L — AB (ref 0–37)
Alkaline Phosphatase: 65 U/L (ref 39–117)
Anion gap: 12 (ref 5–15)
BUN: 13 mg/dL (ref 6–23)
CALCIUM: 8.2 mg/dL — AB (ref 8.4–10.5)
CO2: 24 mEq/L (ref 19–32)
CREATININE: 1.17 mg/dL (ref 0.50–1.35)
Chloride: 103 mEq/L (ref 96–112)
GFR calc Af Amer: 85 mL/min — ABNORMAL LOW (ref >90)
GFR calc non Af Amer: 73 mL/min — ABNORMAL LOW (ref >90)
Glucose, Bld: 138 mg/dL — ABNORMAL HIGH (ref 70–99)
Potassium: 3.5 mEq/L — ABNORMAL LOW (ref 3.7–5.3)
SODIUM: 139 meq/L (ref 137–147)
TOTAL PROTEIN: 5.7 g/dL — AB (ref 6.0–8.3)
Total Bilirubin: 0.4 mg/dL (ref 0.3–1.2)

## 2014-07-09 LAB — DIFFERENTIAL
Basophils Absolute: 0 10*3/uL (ref 0.0–0.1)
Basophils Relative: 0 % (ref 0–1)
Eosinophils Absolute: 0.4 10*3/uL (ref 0.0–0.7)
Eosinophils Relative: 2 % (ref 0–5)
Lymphocytes Relative: 7 % — ABNORMAL LOW (ref 12–46)
Lymphs Abs: 1.2 10*3/uL (ref 0.7–4.0)
Monocytes Absolute: 1.2 10*3/uL — ABNORMAL HIGH (ref 0.1–1.0)
Monocytes Relative: 7 % (ref 3–12)
Neutro Abs: 14.9 10*3/uL — ABNORMAL HIGH (ref 1.7–7.7)
Neutrophils Relative %: 84 % — ABNORMAL HIGH (ref 43–77)

## 2014-07-09 LAB — GLUCOSE, CAPILLARY
Glucose-Capillary: 116 mg/dL — ABNORMAL HIGH (ref 70–99)
Glucose-Capillary: 118 mg/dL — ABNORMAL HIGH (ref 70–99)
Glucose-Capillary: 125 mg/dL — ABNORMAL HIGH (ref 70–99)
Glucose-Capillary: 134 mg/dL — ABNORMAL HIGH (ref 70–99)
Glucose-Capillary: 156 mg/dL — ABNORMAL HIGH (ref 70–99)

## 2014-07-09 LAB — CBC
HEMATOCRIT: 28.9 % — AB (ref 39.0–52.0)
Hemoglobin: 9.8 g/dL — ABNORMAL LOW (ref 13.0–17.0)
MCH: 30.1 pg (ref 26.0–34.0)
MCHC: 33.9 g/dL (ref 30.0–36.0)
MCV: 88.7 fL (ref 78.0–100.0)
PLATELETS: UNDETERMINED 10*3/uL (ref 150–400)
RBC: 3.26 MIL/uL — ABNORMAL LOW (ref 4.22–5.81)
RDW: 13.5 % (ref 11.5–15.5)
WBC: 17.7 10*3/uL — AB (ref 4.0–10.5)

## 2014-07-09 LAB — PHOSPHORUS: PHOSPHORUS: 4 mg/dL (ref 2.3–4.6)

## 2014-07-09 LAB — TRIGLYCERIDES: Triglycerides: 97 mg/dL (ref <150)

## 2014-07-09 LAB — MAGNESIUM: Magnesium: 2.4 mg/dL (ref 1.5–2.5)

## 2014-07-09 LAB — PREALBUMIN: PREALBUMIN: 13.9 mg/dL — AB (ref 17.0–34.0)

## 2014-07-09 MED ORDER — FAT EMULSION 20 % IV EMUL
250.0000 mL | INTRAVENOUS | Status: AC
  Administered 2014-07-09: 250 mL via INTRAVENOUS
  Filled 2014-07-09: qty 250

## 2014-07-09 MED ORDER — POTASSIUM CHLORIDE 10 MEQ/100ML IV SOLN
10.0000 meq | INTRAVENOUS | Status: AC
  Administered 2014-07-09 (×2): 10 meq via INTRAVENOUS
  Filled 2014-07-09 (×2): qty 100

## 2014-07-09 MED ORDER — TRACE MINERALS CR-CU-F-FE-I-MN-MO-SE-ZN IV SOLN
INTRAVENOUS | Status: AC
  Administered 2014-07-09: 18:00:00 via INTRAVENOUS
  Filled 2014-07-09: qty 2000

## 2014-07-09 MED ORDER — KCL-LACTATED RINGERS-D5W 20 MEQ/L IV SOLN
INTRAVENOUS | Status: AC
  Administered 2014-07-09: 18:00:00 via INTRAVENOUS
  Filled 2014-07-09: qty 1000

## 2014-07-09 NOTE — Progress Notes (Signed)
PARENTERAL NUTRITION CONSULT NOTE - INITIAL  Pharmacy Consult for TNA Indication: Prolonged ileus  Allergies  Allergen Reactions  . Celebrex [Celecoxib]     Mother is allergic, so I do not take it  . Sulfa Antibiotics Nausea And Vomiting  . Codeine Rash  . Dilaudid [Hydromorphone Hcl] Nausea And Vomiting and Anxiety    Patient Measurements: Height: 6' (182.9 cm) Weight: 227 lb 15.3 oz (103.4 kg) IBW/kg (Calculated) : 77.6  Vital Signs: Temp: 98.2 F (36.8 C) (09/30 0530) Temp src: Oral (09/30 0530) BP: 131/73 mmHg (09/30 0530) Pulse Rate: 61 (09/30 0530) Intake/Output from previous day: 09/29 0701 - 09/30 0700 In: 1871.3 [P.O.:1200; I.V.:613.3] Out: 60 [Drains:60] Intake/Output from this shift:    Labs:  Recent Labs  07/07/14 0435 07/08/14 0020 07/09/14 0517  WBC 22.6* 20.9* 17.7*  HGB 10.5* 10.8* 9.8*  HCT 30.5* 31.2* 28.9*  PLT 408* 509* PLATELET CLUMPS NOTED ON SMEAR, UNABLE TO ESTIMATE     Recent Labs  07/07/14 0745 07/09/14 0517  NA 139 139  K 3.5* 3.5*  CL 101 103  CO2 26 24  GLUCOSE 122* 138*  BUN 11 13  CREATININE 0.72 1.17  CALCIUM 8.3* 8.2*  MG  --  2.4  PHOS  --  4.0  PROT  --  5.7*  ALBUMIN  --  2.2*  AST  --  43*  ALT  --  71*  ALKPHOS  --  65  BILITOT  --  0.4  TRIG  --  97   Estimated Creatinine Clearance: 98.1 ml/min (by C-G formula based on Cr of 1.17).    Recent Labs  07/09/14 0527 07/09/14 0529 07/09/14 0601  GLUCAP >600* 125* 134*    Medical History: Past Medical History  Diagnosis Date  . Hypertension   . Sarcoidosis   . Hyperlipemia   . Diverticulitis   . Allergy     SEASONAL  . Hypercholesteremia 06/15/2012  . 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 enoxaparin (LOVENOX) injection  40 mg Subcutaneous Q24H  . insulin aspart  0-9 Units Subcutaneous 4 times per day  . pantoprazole (PROTONIX) IV  40 mg Intravenous QHS  . piperacillin-tazobactam  (ZOSYN)  IV  3.375 g Intravenous Q8H  . potassium chloride  10 mEq Intravenous Q1 Hr x 2  . vancomycin  1,000 mg Intravenous Q12H   Infusions:  . dextrose 5% lactated ringers with KCl 20 mEq/L 50 mL/hr at 07/09/14 0206  . Marland KitchenTPN (CLINIMIX-E) Adult 40 mL/hr at 07/08/14 1720   And  . fat emulsion 250 mL (07/08/14 1719)    Insulin Requirements: 2 units SSI (no hx of DM)  Current Nutrition: clear liquid diet  IVF: D5 in LR w/70meq/L @50  ml/hr  Central access: PICC placed 9/29 TPN start date: 9/29  ASSESSMENT                                                                                                          HPI:  [id number] yoM with recurrent sigmoid diverticulitis. S/p lap assisted subtotal colectomy on 9/22. Patient has developed postop ileus. Pharmacy consulted to assist in TPN therapy.   Significant events:  9/29: Had some crampy upper abdominal pain then nausea and vomiting-basically everything he ate yesterday  Today 9/30:   Glucose - CBGs < 150  Electrolytes -K 3.5 (will replace with IV KCL), corrected Ca 9.64, all other WNL  Renal -SCr 1.17 today (up from yesterday)  LFTs - AST/ALT above NL but below x2 ULN. Will monitor  TGs - 97  Prealbumin - pending  NUTRITIONAL GOALS                                                                                    RD recs: 1950-2250 Kcal, 120-140g Protein, 1.9-2.2 L/day fluid Clinimix E 5/15 at a goal rate of 12700ml/hr + 20% fat emulsion at ml/hr to provide: 120 g/day protein, 2184 Kcal/day.  PLAN                                                                                                                  Give 20mEq KCL IV today for K of 3.5 At 1800 today:  Increase Clinimix E 5/15 to 65 ml/hr.  20% fat emulsion at 6610ml/hr.  Plan to advance as tolerated to the goal rate.  TNA to contain standard multivitamins and trace elements.  Reduce IVF to 25 ml/hr.  Continue sensitive scale SSI .   Continue to follow LFTs  F/u  on prealbumin from today  TNA lab panels on Mondays & Thursdays.  F/u daily.  Loma BostonLaura Karrin Eisenmenger, PharmD Pager: [phone] 07/09/2014 7:57 AM

## 2014-07-09 NOTE — Progress Notes (Signed)
8 Days Post-Op  Subjective: Feels significantly better today.  No nausea.  Minimal pain. Objective: Vital signs in last 24 hours: Temp:  [98.2 F (36.8 C)-98.6 F (37 C)] 98.2 F (36.8 C) (09/30 0530) Pulse Rate:  [59-74] 61 (09/30 0530) Resp:  [18-20] 18 (09/30 0530) BP: (110-143)/(73-91) 131/73 mmHg (09/30 0530) SpO2:  [95 %-100 %] 95 % (09/30 0530) Last BM Date: 07/08/14  Intake/Output from previous day: 09/29 0701 - 09/30 0700 In: 1871.3 [P.O.:1200; I.V.:613.3] Out: 60 [Drains:60] Intake/Output this shift:    PE: General- In NAD Abdomen-soft, incisions are clean and intact, serous drain output (decreasing), continued decreased bowel sounds Lab Results:   Recent Labs  07/08/14 0020 07/09/14 0517  WBC 20.9* 17.7*  HGB 10.8* 9.8*  HCT 31.2* 28.9*  PLT 509* PLATELET CLUMPS NOTED ON SMEAR, UNABLE TO ESTIMATE   BMET  Recent Labs  07/07/14 0745 07/09/14 0517  NA 139 139  K 3.5* 3.5*  CL 101 103  CO2 26 24  GLUCOSE 122* 138*  BUN 11 13  CREATININE 0.72 1.17  CALCIUM 8.3* 8.2*   PT/INR No results found for this basename: LABPROT, INR,  in the last 72 hours Comprehensive Metabolic Panel:    Component Value Date/Time   NA 139 07/09/2014 0517   NA 139 07/07/2014 0745   K 3.5* 07/09/2014 0517   K 3.5* 07/07/2014 0745   CL 103 07/09/2014 0517   CL 101 07/07/2014 0745   CO2 24 07/09/2014 0517   CO2 26 07/07/2014 0745   BUN 13 07/09/2014 0517   BUN 11 07/07/2014 0745   CREATININE 1.17 07/09/2014 0517   CREATININE 0.72 07/07/2014 0745   GLUCOSE 138* 07/09/2014 0517   GLUCOSE 122* 07/07/2014 0745   CALCIUM 8.2* 07/09/2014 0517   CALCIUM 8.3* 07/07/2014 0745   AST 43* 07/09/2014 0517   AST 25 06/25/2014 1445   ALT 71* 07/09/2014 0517   ALT 33 06/25/2014 1445   ALKPHOS 65 07/09/2014 0517   ALKPHOS 69 06/25/2014 1445   BILITOT 0.4 07/09/2014 0517   BILITOT 0.7 06/25/2014 1445   PROT 5.7* 07/09/2014 0517   PROT 7.6 06/25/2014 1445   ALBUMIN 2.2* 07/09/2014 0517   ALBUMIN 4.2  06/25/2014 1445     Studies/Results: Ct Abdomen Pelvis W Contrast  07/07/2014   CLINICAL DATA:  Elevated white blood cell count following subtotal colectomy  EXAM: CT ABDOMEN AND PELVIS WITH CONTRAST  TECHNIQUE: Multidetector CT imaging of the abdomen and pelvis was performed using the standard protocol following bolus administration of intravenous contrast.  CONTRAST:  OMNIPAQUE IOHEXOL 300 MG/ML  SOLN  COMPARISON:  03/24/2014  FINDINGS: Lung bases demonstrate small pleural effusion and mild left lower lobe atelectasis.  The liver again demonstrates some scattered hypodensities likely representing cysts. The spleen, gallbladder, adrenal glands and kidneys are normal in their CT appearance. Delayed images show excretion of contrast material bilaterally. A few small left renal cysts are again noted.  There are changes noted consistent with the history of subtotal colectomy. A surgical drain is noted in the left lower quadrant. There are multiple dilated loops of small bowel identified both within the jejunum and ileum. There are multiple loops of what appear to be proximal ileum which demonstrate significant wall edema and mesenteric stranding. Additionally and irregular fluid collection is noted adjacent to the pancreas and extending inferiorly along mesenteric its largest peripancreatic component measures approximately 5.7 x 4.6 cm. Inferiorly anterior to the aorta it measures approximately 2.3 x 4.9 cm.  It is possible this represents a postoperative fluid collection with the possibility of pancreatitis could not be totally excluded based on the appearance. Correlation with laboratory values is recommended. This may be in part the etiology of the inflammatory changes surrounding the small bowel and subsequent small bowel dilatation. No definitive transition point to correspond with the degree of dilatation is noted.  The bladder is well distended. No acute osseous abnormality is seen.  IMPRESSION:  Postoperative changes as described.  There is a fluid collection adjacent to the pancreas which extends inferiorly as described. This is suspicious for pancreatitis with subsequent phlegmon. Correlation with lipase level is recommended. If this is relatively normal this may simply represent a postoperative fluid collection which is not organized into an abscess this time. It is not approach pole [facility] percutaneous technique at this time due to the overlying bowel.  Multiple dilated loops of small bowel which may simply represent postoperative ileus although some edematous small bowel is identified near the junction of the jejunum and ileum although this may be reactive due to the postoperative fluid collection and inflammatory change.  These results were discussed by telephone at the time of interpretation on 07/07/2014 at 12:08 pm to Dr. Avel PeaceDD Adolphus Hanf , who verbally acknowledged these results.   Electronically Signed   By: Alcide CleverMark  Lukens M.D.   On: 07/07/2014 12:08    Anti-infectives: Anti-infectives   Start     Dose/Rate Route Frequency Ordered Stop   07/08/14 2100  vancomycin (VANCOCIN) IVPB 1000 mg/200 mL premix     1,000 mg 200 mL/hr over 60 Minutes Intravenous Every 12 hours 07/08/14 1104     07/08/14 0800  vancomycin (VANCOCIN) 2,000 mg in sodium chloride 0.9 % 500 mL IVPB     2,000 mg 250 mL/hr over 120 Minutes Intravenous  Once 07/08/14 0757 07/08/14 1107   07/07/14 1000  piperacillin-tazobactam (ZOSYN) IVPB 3.375 g     3.375 g 12.5 mL/hr over 240 Minutes Intravenous Every 8 hours 07/07/14 0821     07/01/14 2000  cefoTEtan (CEFOTAN) 2 g in dextrose 5 % 50 mL IVPB     2 g 100 mL/hr over 30 Minutes Intravenous Every 12 hours 07/01/14 1339 07/01/14 2015   07/01/14 0625  cefoTEtan in Dextrose 5% (CEFOTAN) IVPB 2 g  Status:  Discontinued     2 g Intravenous Every 12 hours 07/01/14 0625 07/01/14 1413      Assessment Principal Problem:  1. Sigmoid diverticulitis-recurrent s/p lap assisted  subtotal colectomy: ileus continues  2.  Leukocytosis-improving on Vanc and Zosyn; ? PNA vs intraabdominal infection   3.  PC Malnutrition-Albumin 2.2; TPN started yesterday.     LOS: 8 days   Plan: Continue IV abxs, TPN.  Wait for ileus to resolve.   Aryanne Gilleland J 07/09/2014

## 2014-07-10 LAB — COMPREHENSIVE METABOLIC PANEL
ALK PHOS: 73 U/L (ref 39–117)
ALT: 65 U/L — ABNORMAL HIGH (ref 0–53)
AST: 32 U/L (ref 0–37)
Albumin: 2.4 g/dL — ABNORMAL LOW (ref 3.5–5.2)
Anion gap: 15 (ref 5–15)
BUN: 15 mg/dL (ref 6–23)
CO2: 20 meq/L (ref 19–32)
Calcium: 8.3 mg/dL — ABNORMAL LOW (ref 8.4–10.5)
Chloride: 104 mEq/L (ref 96–112)
Creatinine, Ser: 1.23 mg/dL (ref 0.50–1.35)
GFR, EST AFRICAN AMERICAN: 80 mL/min — AB (ref >90)
GFR, EST NON AFRICAN AMERICAN: 69 mL/min — AB (ref >90)
GLUCOSE: 154 mg/dL — AB (ref 70–99)
Potassium: 3.4 mEq/L — ABNORMAL LOW (ref 3.7–5.3)
Sodium: 139 mEq/L (ref 137–147)
TOTAL PROTEIN: 6.3 g/dL (ref 6.0–8.3)
Total Bilirubin: 0.5 mg/dL (ref 0.3–1.2)

## 2014-07-10 LAB — CBC
HCT: 30 % — ABNORMAL LOW (ref 39.0–52.0)
Hemoglobin: 10.1 g/dL — ABNORMAL LOW (ref 13.0–17.0)
MCH: 30 pg (ref 26.0–34.0)
MCHC: 33.7 g/dL (ref 30.0–36.0)
MCV: 89 fL (ref 78.0–100.0)
Platelets: 590 10*3/uL — ABNORMAL HIGH (ref 150–400)
RBC: 3.37 MIL/uL — ABNORMAL LOW (ref 4.22–5.81)
RDW: 13.8 % (ref 11.5–15.5)
WBC: 26.8 10*3/uL — ABNORMAL HIGH (ref 4.0–10.5)

## 2014-07-10 LAB — GLUCOSE, CAPILLARY
Glucose-Capillary: >600 mg/dL (ref 70–99)
Glucose-Capillary: 136 mg/dL — ABNORMAL HIGH (ref 70–99)
Glucose-Capillary: 124 mg/dL — ABNORMAL HIGH (ref 70–99)
Glucose-Capillary: 95 mg/dL (ref 70–99)

## 2014-07-10 LAB — MAGNESIUM: Magnesium: 2.5 mg/dL (ref 1.5–2.5)

## 2014-07-10 LAB — PHOSPHORUS: PHOSPHORUS: 4.3 mg/dL (ref 2.3–4.6)

## 2014-07-10 LAB — AMYLASE, PERITONEAL FLUID: Amylase, peritoneal fluid: 23 U/L

## 2014-07-10 LAB — CREATININE, FLUID (PLEURAL, PERITONEAL, JP DRAINAGE): CREAT FL: 1.3 mg/dL

## 2014-07-10 MED ORDER — KCL-LACTATED RINGERS-D5W 20 MEQ/L IV SOLN
INTRAVENOUS | Status: AC
  Administered 2014-07-10: 10 mL via INTRAVENOUS
  Filled 2014-07-10: qty 1000

## 2014-07-10 MED ORDER — POTASSIUM CHLORIDE 10 MEQ/100ML IV SOLN
10.0000 meq | INTRAVENOUS | Status: AC
  Administered 2014-07-10 (×4): 10 meq via INTRAVENOUS
  Filled 2014-07-10 (×4): qty 100

## 2014-07-10 MED ORDER — BOOST / RESOURCE BREEZE PO LIQD
1.0000 | Freq: Three times a day (TID) | ORAL | Status: DC
  Administered 2014-07-10 – 2014-07-11 (×2): 1 via ORAL
  Administered 2014-07-12: 20:00:00 via ORAL
  Administered 2014-07-13 – 2014-07-14 (×3): 1 via ORAL

## 2014-07-10 MED ORDER — TRACE MINERALS CR-CU-F-FE-I-MN-MO-SE-ZN IV SOLN
INTRAVENOUS | Status: AC
  Administered 2014-07-10: 18:00:00 via INTRAVENOUS
  Filled 2014-07-10: qty 2000

## 2014-07-10 MED ORDER — FAT EMULSION 20 % IV EMUL
250.0000 mL | INTRAVENOUS | Status: AC
  Administered 2014-07-10: 250 mL via INTRAVENOUS
  Filled 2014-07-10: qty 250

## 2014-07-10 NOTE — Progress Notes (Signed)
PARENTERAL NUTRITION CONSULT NOTE - INITIAL  Pharmacy Consult for TNA Indication: Prolonged ileus  Allergies  Allergen Reactions  . Celebrex [Celecoxib]     Mother is allergic, so I do not take it  . Sulfa Antibiotics Nausea And Vomiting  . Codeine Rash  . Dilaudid [Hydromorphone Hcl] Nausea And Vomiting and Anxiety    Patient Measurements: Height: 6' (182.9 cm) Weight: 227 lb 15.3 oz (103.4 kg) IBW/kg (Calculated) : 77.6  Vital Signs: Temp: 98.4 F (36.9 C) (10/01 0548) Temp src: Oral (10/01 0548) BP: 123/79 mmHg (10/01 0548) Pulse Rate: 73 (10/01 0548) Intake/Output from previous day: 09/30 0701 - 10/01 0700 In: 2430 [P.O.:1080; I.V.:600; IV Piggyback:750] Out: 795 [Urine:650; Drains:145] Intake/Output from this shift: Total I/O In: 240 [P.O.:240] Out: 20 [Drains:20]  Labs:  Recent Labs  07/08/14 0020 07/09/14 0517 07/10/14 0535  WBC 20.9* 17.7* 26.8*  HGB 10.8* 9.8* 10.1*  HCT 31.2* 28.9* 30.0*  PLT 509* PLATELET CLUMPS NOTED ON SMEAR, UNABLE TO ESTIMATE 590*     Recent Labs  07/09/14 0517 07/10/14 0535  NA 139 139  K 3.5* 3.4*  CL 103 104  CO2 24 20  GLUCOSE 138* 154*  BUN 13 15  CREATININE 1.17 1.23  CALCIUM 8.2* 8.3*  MG 2.4 2.5  PHOS 4.0 4.3  PROT 5.7* 6.3  ALBUMIN 2.2* 2.4*  AST 43* 32  ALT 71* 65*  ALKPHOS 65 73  BILITOT 0.4 0.5  PREALBUMIN 13.9*  --   TRIG 97  --    Estimated Creatinine Clearance: 93.3 ml/min (by C-G formula based on Cr of 1.23).    Recent Labs  07/09/14 1731 07/09/14 2341 07/10/14 0525  GLUCAP 116* 156* 136*    Medical History: Past Medical History  Diagnosis Date  . Hypertension   . Sarcoidosis   . Hyperlipemia   . Diverticulitis   . Allergy     SEASONAL  . Hypercholesteremia 06/15/2012  . 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 enoxaparin (LOVENOX) injection  40 mg Subcutaneous Q24H  . insulin aspart  0-9 Units Subcutaneous 4 times per  day  . pantoprazole (PROTONIX) IV  40 mg Intravenous QHS  . potassium chloride  10 mEq Intravenous Q1 Hr x 4   Infusions:  . dextrose 5% lactated ringers with KCl 20 mEq/L 25 mL/hr at 07/09/14 1817  . Marland Kitchen.TPN (CLINIMIX-E) Adult 65 mL/hr at 07/09/14 1745   And  . fat emulsion 250 mL (07/09/14 1745)    Insulin Requirements: 3 units SSI (no hx of DM)  Current Nutrition: clear liquid diet  IVF: D5 in LR w/6120meq/L @25  ml/hr  Central access: PICC placed 9/29 TPN start date: 9/29  ASSESSMENT                                                                                                          HPI:  [id number] yoM with recurrent sigmoid diverticulitis. S/p lap assisted subtotal colectomy on 9/22. Patient has developed postop ileus. Pharmacy consulted  to assist in TPN therapy.   Significant events:  9/29: Had some crampy upper abdominal pain then nausea and vomiting-basically everything he ate yesterday  Today 9/30:   Glucose - CBGs < 150  Electrolytes -K 3.4 (will replace with IV KCL), corrected Ca 10.4, all other WNL  Renal -SCr 1.23 today (up from yesterday)  LFTs - ALT above NL but below x2 ULN. Will monitor  TGs - 97  Prealbumin - 13.9  NUTRITIONAL GOALS                                                                                    RD recs: 1950-2250 Kcal, 120-140g Protein, 1.9-2.2 L/day fluid Clinimix E 5/15 at a goal rate of 142ml/hr + 20% fat emulsion at ml/hr to provide: 120 g/day protein, 2184 Kcal/day.  PLAN                                                                                                                  Give KCL IV today for K of 3.4 At 1800 today:  Increase Clinimix E 5/15 to 80 ml/hr.  20% fat emulsion at 42ml/hr.  Plan to advance as tolerated to the goal rate.  TNA to contain standard multivitamins and trace elements.  Reduce IVF to 10 ml/hr.  Continue sensitive scale SSI .   Continue to follow LFTs  TNA lab panels on Mondays &  Thursdays.  F/u daily.  Arley Phenix RPh 07/10/2014, 10:55 AM Pager [phone]

## 2014-07-10 NOTE — Progress Notes (Signed)
9 Days Post-Op  Subjective: Continues to feel better.  Passing some gas and moving bowel some.  Some bloating.    Objective: Vital signs in last 24 hours: Temp:  [98.3 F (36.8 C)-98.4 F (36.9 C)] 98.4 F (36.9 C) (10/01 0548) Pulse Rate:  [64-73] 73 (10/01 0548) Resp:  [18] 18 (10/01 0548) BP: (123-144)/(75-88) 123/79 mmHg (10/01 0548) SpO2:  [100 %] 100 % (10/01 0548) Last BM Date: 07/08/14  Intake/Output from previous day: 09/30 0701 - 10/01 0700 In: 2430 [P.O.:1080; I.V.:600; IV Piggyback:750] Out: 795 [Urine:650; Drains:145] Intake/Output this shift:    PE: General- In NAD Abdomen-soft, incisions are clean and intact, serous drain output, hypoactive bowel sounds Lab Results:   Recent Labs  07/09/14 0517 07/10/14 0535  WBC 17.7* 26.8*  HGB 9.8* 10.1*  HCT 28.9* 30.0*  PLT PLATELET CLUMPS NOTED ON SMEAR, UNABLE TO ESTIMATE 590*   BMET  Recent Labs  07/09/14 0517 07/10/14 0535  NA 139 139  K 3.5* 3.4*  CL 103 104  CO2 24 20  GLUCOSE 138* 154*  BUN 13 15  CREATININE 1.17 1.23  CALCIUM 8.2* 8.3*   PT/INR No results found for this basename: LABPROT, INR,  in the last 72 hours Comprehensive Metabolic Panel:    Component Value Date/Time   NA 139 07/10/2014 0535   NA 139 07/09/2014 0517   K 3.4* 07/10/2014 0535   K 3.5* 07/09/2014 0517   CL 104 07/10/2014 0535   CL 103 07/09/2014 0517   CO2 20 07/10/2014 0535   CO2 24 07/09/2014 0517   BUN 15 07/10/2014 0535   BUN 13 07/09/2014 0517   CREATININE 1.23 07/10/2014 0535   CREATININE 1.17 07/09/2014 0517   GLUCOSE 154* 07/10/2014 0535   GLUCOSE 138* 07/09/2014 0517   CALCIUM 8.3* 07/10/2014 0535   CALCIUM 8.2* 07/09/2014 0517   AST 32 07/10/2014 0535   AST 43* 07/09/2014 0517   ALT 65* 07/10/2014 0535   ALT 71* 07/09/2014 0517   ALKPHOS 73 07/10/2014 0535   ALKPHOS 65 07/09/2014 0517   BILITOT 0.5 07/10/2014 0535   BILITOT 0.4 07/09/2014 0517   PROT 6.3 07/10/2014 0535   PROT 5.7* 07/09/2014 0517   ALBUMIN 2.4*  07/10/2014 0535   ALBUMIN 2.2* 07/09/2014 0517     Studies/Results: No results found.  Anti-infectives: Anti-infectives   Start     Dose/Rate Route Frequency Ordered Stop   07/08/14 2100  vancomycin (VANCOCIN) IVPB 1000 mg/200 mL premix     1,000 mg 200 mL/hr over 60 Minutes Intravenous Every 12 hours 07/08/14 1104     07/08/14 0800  vancomycin (VANCOCIN) 2,000 mg in sodium chloride 0.9 % 500 mL IVPB     2,000 mg 250 mL/hr over 120 Minutes Intravenous  Once 07/08/14 0757 07/08/14 1107   07/07/14 1000  piperacillin-tazobactam (ZOSYN) IVPB 3.375 g     3.375 g 12.5 mL/hr over 240 Minutes Intravenous Every 8 hours 07/07/14 0821     07/01/14 2000  cefoTEtan (CEFOTAN) 2 g in dextrose 5 % 50 mL IVPB     2 g 100 mL/hr over 30 Minutes Intravenous Every 12 hours 07/01/14 1339 07/01/14 2015   07/01/14 0625  cefoTEtan in Dextrose 5% (CEFOTAN) IVPB 2 g  Status:  Discontinued     2 g Intravenous Every 12 hours 07/01/14 0625 07/01/14 1413      Assessment Principal Problem:  1. Sigmoid diverticulitis-recurrent s/p lap assisted subtotal colectomy: ileus continues  2.  Leukocytosis-WBC up without an  obvious source of infection; on abxs.   3.  PC Malnutrition-prealbumin 13.9; on TPN.     LOS: 9 days   Plan: Stop antibiotics and observe.  Continue TPN and clear liquids.  If WBC continues to go up with get CT of chest/abd/pelvis tomorrow.   Temesha Queener J 07/10/2014

## 2014-07-10 NOTE — Progress Notes (Signed)
NUTRITION FOLLOW UP  Intervention:   - TPN per pharmacy - Diet advancement per MD - Resource Breeze TID - RD to continue to monitor   Nutrition Dx:   Inadequate oral intake related to clear liquid diet as evidenced by diet order - ongoing    Goal:   1. TPN to meet >90% of estimated nutritional needs - not met but advancing 2. Advance diet as tolerated to soft diet - not met   Monitor:   Weights, labs, diet advancement, TPN  Assessment:   Pt with recurrent sigmoid diverticulitis, admitted for subtotal colectomy which was performed 9/22. Developed postop ileus. Had some crampy upper abdominal pain then nausea and vomiting-basically everything he ate yesterday per MD. Scheduled to start TPN today.   9/29: - Met with pt who reports recently his appetite has been down with pt consuming 50% of usual intake of 2-3 well balanced meals/day  - Denies any significant changes in weight, says it fluctuates between 10-12 pounds  - C/o "hard" burping, 7-8 times/day with his stomach feeling more bloated  - Thinks he's having some nausea from gas - notified RN  - Performed nutrition focused physical exam which was WNL   10/1: - Per MD, pt's ileus continues - No nausea today or yesterday - Met with pt who had not yet ordered clear liquid tray this morning, had been up walking - Said he did well with clear liquid diet yesterday - Diet intake discussed with pharmacist  TPN: Clinimix E 5/15 @ 65 ml/hr and lipids @ 10 ml/hr. Provides 1588 kcal, and 78 grams protein per day. Meets 81% minimum estimated energy needs and 65% minimum estimated protein needs.  ALT elevated but trending down Triglycerides WNL PALB low  CBGs < 13m/dL   Height: Ht Readings from Last 1 Encounters:  07/01/14 6' (1.829 m)    Weight Status:   Wt Readings from Last 1 Encounters:  07/01/14 227 lb 15.3 oz (103.4 kg)    Re-estimated needs:  Kcal: [address] Protein: 120-140g  Fluid: 1.9-2.2L/day   Skin:  surgical incision on abdomen   Diet Order: Clear Liquid   Intake/Output Summary (Last 24 hours) at 07/10/14 1041 Last data filed at 07/10/14 1000  Gross per 24 hour  Intake   2470 ml  Output    775 ml  Net   1695 ml    Last BM: 9/29   Labs:   Recent Labs Lab 07/07/14 0745 07/09/14 0517 07/10/14 0535  NA 139 139 139  K 3.5* 3.5* 3.4*  CL 101 103 104  CO2 26 24 20   BUN 11 13 15   CREATININE 0.72 1.17 1.23  CALCIUM 8.3* 8.2* 8.3*  MG  --  2.4 2.5  PHOS  --  4.0 4.3  GLUCOSE 122* 138* 154*    CBG (last 3)   Recent Labs  07/09/14 1731 07/09/14 2341 07/10/14 0525  GLUCAP 116* 156* 136*    Scheduled Meds: . enoxaparin (LOVENOX) injection  40 mg Subcutaneous Q24H  . insulin aspart  0-9 Units Subcutaneous 4 times per day  . pantoprazole (PROTONIX) IV  40 mg Intravenous QHS    Continuous Infusions: . dextrose 5% lactated ringers with KCl 20 mEq/L 25 mL/hr at 07/09/14 1817  . .Marland KitchenPN (CLINIMIX-E) Adult 65 mL/hr at 07/09/14 1745   And  . fat emulsion 250 mL (07/09/14 1745)    HCarlis StableMS, RD, LDN [phone] [phone]/After Hours Pager

## 2014-07-11 ENCOUNTER — Encounter (HOSPITAL_COMMUNITY): Payer: Self-pay | Admitting: Radiology

## 2014-07-11 ENCOUNTER — Inpatient Hospital Stay (HOSPITAL_COMMUNITY): Payer: BC Managed Care – PPO

## 2014-07-11 LAB — BASIC METABOLIC PANEL
Anion gap: 12 (ref 5–15)
BUN: 15 mg/dL (ref 6–23)
CHLORIDE: 100 meq/L (ref 96–112)
CO2: 21 meq/L (ref 19–32)
Calcium: 8.4 mg/dL (ref 8.4–10.5)
Creatinine, Ser: 1.2 mg/dL (ref 0.50–1.35)
GFR calc Af Amer: 82 mL/min — ABNORMAL LOW (ref >90)
GFR calc non Af Amer: 71 mL/min — ABNORMAL LOW (ref >90)
GLUCOSE: 132 mg/dL — AB (ref 70–99)
POTASSIUM: 3.6 meq/L — AB (ref 3.7–5.3)
Sodium: 133 mEq/L — ABNORMAL LOW (ref 137–147)

## 2014-07-11 LAB — CBC
HCT: 29.8 % — ABNORMAL LOW (ref 39.0–52.0)
HEMOGLOBIN: 9.8 g/dL — AB (ref 13.0–17.0)
MCH: 29.7 pg (ref 26.0–34.0)
MCHC: 32.9 g/dL (ref 30.0–36.0)
MCV: 90.3 fL (ref 78.0–100.0)
Platelets: 556 10*3/uL — ABNORMAL HIGH (ref 150–400)
RBC: 3.3 MIL/uL — AB (ref 4.22–5.81)
RDW: 13.7 % (ref 11.5–15.5)
WBC: 27.6 10*3/uL — ABNORMAL HIGH (ref 4.0–10.5)

## 2014-07-11 LAB — DIFFERENTIAL
Basophils Absolute: 0.1 10*3/uL (ref 0.0–0.1)
Basophils Relative: 0 % (ref 0–1)
Eosinophils Absolute: 0.8 10*3/uL — ABNORMAL HIGH (ref 0.0–0.7)
Eosinophils Relative: 3 % (ref 0–5)
LYMPHS ABS: 1.5 10*3/uL (ref 0.7–4.0)
LYMPHS PCT: 6 % — AB (ref 12–46)
Monocytes Absolute: 1.7 10*3/uL — ABNORMAL HIGH (ref 0.1–1.0)
Monocytes Relative: 6 % (ref 3–12)
NEUTROS PCT: 85 % — AB (ref 43–77)
Neutro Abs: 23.9 10*3/uL — ABNORMAL HIGH (ref 1.7–7.7)

## 2014-07-11 LAB — GLUCOSE, CAPILLARY
GLUCOSE-CAPILLARY: 116 mg/dL — AB (ref 70–99)
GLUCOSE-CAPILLARY: 124 mg/dL — AB (ref 70–99)
GLUCOSE-CAPILLARY: 128 mg/dL — AB (ref 70–99)
Glucose-Capillary: 119 mg/dL — ABNORMAL HIGH (ref 70–99)
Glucose-Capillary: 121 mg/dL — ABNORMAL HIGH (ref 70–99)

## 2014-07-11 LAB — CLOSTRIDIUM DIFFICILE BY PCR: CDIFFPCR: NEGATIVE

## 2014-07-11 MED ORDER — TRACE MINERALS CR-CU-F-FE-I-MN-MO-SE-ZN IV SOLN
INTRAVENOUS | Status: AC
  Administered 2014-07-11: 18:00:00 via INTRAVENOUS
  Filled 2014-07-11: qty 2000

## 2014-07-11 MED ORDER — IOHEXOL 300 MG/ML  SOLN
25.0000 mL | INTRAMUSCULAR | Status: AC
  Administered 2014-07-11 (×2): 25 mL via ORAL

## 2014-07-11 MED ORDER — KETOROLAC TROMETHAMINE 30 MG/ML IJ SOLN
INTRAMUSCULAR | Status: AC
  Filled 2014-07-11: qty 1

## 2014-07-11 MED ORDER — POTASSIUM CHLORIDE 10 MEQ/100ML IV SOLN
10.0000 meq | INTRAVENOUS | Status: AC
  Administered 2014-07-11 (×2): 10 meq via INTRAVENOUS
  Filled 2014-07-11 (×2): qty 100

## 2014-07-11 MED ORDER — KETOROLAC TROMETHAMINE 30 MG/ML IJ SOLN
30.0000 mg | Freq: Four times a day (QID) | INTRAMUSCULAR | Status: AC | PRN
  Administered 2014-07-11 – 2014-07-12 (×4): 30 mg via INTRAVENOUS
  Filled 2014-07-11 (×3): qty 1

## 2014-07-11 MED ORDER — FAT EMULSION 20 % IV EMUL
250.0000 mL | INTRAVENOUS | Status: AC
  Administered 2014-07-11: 250 mL via INTRAVENOUS
  Filled 2014-07-11: qty 250

## 2014-07-11 MED ORDER — IOHEXOL 350 MG/ML SOLN
100.0000 mL | Freq: Once | INTRAVENOUS | Status: AC | PRN
  Administered 2014-07-11: 100 mL via INTRAVENOUS

## 2014-07-11 NOTE — Plan of Care (Signed)
Problem: Phase II Progression Outcomes Goal: Tolerating diet Outcome: Progressing Clear liquids     

## 2014-07-11 NOTE — Progress Notes (Addendum)
10 Days Post-Op  Subjective: He feels good.  Minimal pain.  No nausea.  Passing gas.  Multiple loose BMs.  Mild dyspnea when walking some times   Objective: Vital signs in last 24 hours: Temp:  [98 F (36.7 C)-98.9 F (37.2 C)] 98.5 F (36.9 C) (10/02 09810633) Pulse Rate:  [71-78] 71 (10/02 0633) Resp:  [18] 18 (10/02 0633) BP: (117-134)/(74-84) 117/74 mmHg (10/02 0633) SpO2:  [99 %-100 %] 99 % (10/02 19140633) Last BM Date: 07/08/14  Intake/Output from previous day: 10/01 0701 - 10/02 0700 In: 2532.9 [P.O.:2040; I.V.:492.9] Out: 115 [Drains:115] Intake/Output this shift:    PE: General- In NAD Lungs-clear Abdomen-soft, incisions are clean and intact, serous drain output, hypoactive bowel sounds Extr-no calf swelling or tenderness Lab Results:   Recent Labs  07/10/14 0535 07/11/14 0425  WBC 26.8* 27.6*  HGB 10.1* 9.8*  HCT 30.0* 29.8*  PLT 590* 556*   BMET  Recent Labs  07/10/14 0535 07/11/14 0425  NA 139 133*  K 3.4* 3.6*  CL 104 100  CO2 20 21  GLUCOSE 154* 132*  BUN 15 15  CREATININE 1.23 1.20  CALCIUM 8.3* 8.4   PT/INR No results found for this basename: LABPROT, INR,  in the last 72 hours Comprehensive Metabolic Panel:    Component Value Date/Time   NA 133* 07/11/2014 0425   NA 139 07/10/2014 0535   K 3.6* 07/11/2014 0425   K 3.4* 07/10/2014 0535   CL 100 07/11/2014 0425   CL 104 07/10/2014 0535   CO2 21 07/11/2014 0425   CO2 20 07/10/2014 0535   BUN 15 07/11/2014 0425   BUN 15 07/10/2014 0535   CREATININE 1.20 07/11/2014 0425   CREATININE 1.23 07/10/2014 0535   GLUCOSE 132* 07/11/2014 0425   GLUCOSE 154* 07/10/2014 0535   CALCIUM 8.4 07/11/2014 0425   CALCIUM 8.3* 07/10/2014 0535   AST 32 07/10/2014 0535   AST 43* 07/09/2014 0517   ALT 65* 07/10/2014 0535   ALT 71* 07/09/2014 0517   ALKPHOS 73 07/10/2014 0535   ALKPHOS 65 07/09/2014 0517   BILITOT 0.5 07/10/2014 0535   BILITOT 0.4 07/09/2014 0517   PROT 6.3 07/10/2014 0535   PROT 5.7* 07/09/2014 0517   ALBUMIN 2.4* 07/10/2014 0535   ALBUMIN 2.2* 07/09/2014 0517     Studies/Results: No results found.  Anti-infectives: Anti-infectives   Start     Dose/Rate Route Frequency Ordered Stop   07/08/14 2100  vancomycin (VANCOCIN) IVPB 1000 mg/200 mL premix  Status:  Discontinued     1,000 mg 200 mL/hr over 60 Minutes Intravenous Every 12 hours 07/08/14 1104 07/10/14 0832   07/08/14 0800  vancomycin (VANCOCIN) 2,000 mg in sodium chloride 0.9 % 500 mL IVPB     2,000 mg 250 mL/hr over 120 Minutes Intravenous  Once 07/08/14 0757 07/08/14 1107   07/07/14 1000  piperacillin-tazobactam (ZOSYN) IVPB 3.375 g  Status:  Discontinued     3.375 g 12.5 mL/hr over 240 Minutes Intravenous Every 8 hours 07/07/14 0821 07/10/14 0832   07/01/14 2000  cefoTEtan (CEFOTAN) 2 g in dextrose 5 % 50 mL IVPB     2 g 100 mL/hr over 30 Minutes Intravenous Every 12 hours 07/01/14 1339 07/01/14 2015   07/01/14 0625  cefoTEtan in Dextrose 5% (CEFOTAN) IVPB 2 g  Status:  Discontinued     2 g Intravenous Every 12 hours 07/01/14 0625 07/01/14 1413      Assessment Principal Problem:  1. Sigmoid diverticulitis-recurrent s/p lap assisted  subtotal colectomy: ileus improving; he looks well clinically.  2.  Leukocytosis-WBC up by one point today without an obvious source of infection; off abxs.  No fever.  Has been on abxs as an outpatient for a long time, ? C.Diff.   3.  PC Malnutrition-prealbumin 13.9; on TPN.     LOS: 10 days   Plan: Check differential on CBC.  Check stool for C. Diff. Obtain CT of chest to r/o an occult PE or PNA.  Repeat CT of abdomen and pelvis to check for a source of infection.  Remove staples.   Gentri Guardado J 07/11/2014

## 2014-07-11 NOTE — Progress Notes (Signed)
PARENTERAL NUTRITION CONSULT NOTE  Pharmacy Consult for TNA Indication: Prolonged postoperative ileus  Allergies  Allergen Reactions  . Celebrex [Celecoxib]     Mother is allergic, so I do not take it  . Sulfa Antibiotics Nausea And Vomiting  . Codeine Rash  . Dilaudid [Hydromorphone Hcl] Nausea And Vomiting and Anxiety    Patient Measurements: Height: 6' (182.9 cm) Weight: 227 lb 15.3 oz (103.4 kg) IBW/kg (Calculated) : 77.6  Vital Signs: Temp: 98.5 F (36.9 C) (10/02 0633) Temp Source: Oral (10/02 9604) BP: 117/74 mmHg (10/02 5409) Pulse Rate: 71 (10/02 0633) Intake/Output from previous day: 10/01 0701 - 10/02 0700 In: 2532.9 [P.O.:2040; I.V.:492.9] Out: 115 [Drains:115] Intake/Output from this shift:    Labs:  Recent Labs  07/09/14 0517 07/10/14 0535 07/11/14 0425  WBC 17.7* 26.8* 27.6*  HGB 9.8* 10.1* 9.8*  HCT 28.9* 30.0* 29.8*  PLT PLATELET CLUMPS NOTED ON SMEAR, UNABLE TO ESTIMATE 590* 556*     Recent Labs  07/09/14 0517 07/10/14 0535 07/11/14 0425  NA 139 139 133*  K 3.5* 3.4* 3.6*  CL 103 104 100  CO2 24 20 21   GLUCOSE 138* 154* 132*  BUN 13 15 15   CREATININE 1.17 1.23 1.20  CALCIUM 8.2* 8.3* 8.4  MG 2.4 2.5  --   PHOS 4.0 4.3  --   PROT 5.7* 6.3  --   ALBUMIN 2.2* 2.4*  --   AST 43* 32  --   ALT 71* 65*  --   ALKPHOS 65 73  --   BILITOT 0.4 0.5  --   PREALBUMIN 13.9*  --   --   TRIG 97  --   --   corrected Ca 9.7 Estimated Creatinine Clearance: 95.6 ml/min (by C-G formula based on Cr of 1.2).    Recent Labs  07/10/14 1803 07/11/14 0007 07/11/14 0620  GLUCAP 95 121* 124*    Medical History: Past Medical History  Diagnosis Date  . Hypertension   . Sarcoidosis   . Hyperlipemia   . Diverticulitis   . Allergy     SEASONAL  . Hypercholesteremia 06/15/2012  . 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 enoxaparin (LOVENOX) injection  40 mg Subcutaneous Q24H  . feeding  supplement (RESOURCE BREEZE)  1 Container Oral TID BM  . insulin aspart  0-9 Units Subcutaneous 4 times per day  . iohexol  25 mL Oral Q1 Hr x 2  . pantoprazole (PROTONIX) IV  40 mg Intravenous QHS  . potassium chloride  10 mEq Intravenous Q1 Hr x 2   Infusions:  . dextrose 5% lactated ringers with KCl 20 mEq/L 10 mL (07/10/14 2245)  . Marland KitchenTPN (CLINIMIX-E) Adult 80 mL/hr at 07/10/14 1739   And  . fat emulsion 250 mL (07/10/14 1739)    Insulin Requirements in past 24 hrs: 3 units SSI (no hx of DM)  Current Nutrition:  clear liquid diet Clinimix-E 5/15 at 80 mL/hr Fat Emulsion 20% at 10 mL/hr  IVF: D5 in LR w/KCl 40meq/L @25  ml/hr  Central access: PICC placed 9/29 TPN start date: 9/29  ASSESSMENT  HPI:  [id number] yoM with recurrent sigmoid diverticulitis. S/p lap assisted subtotal colectomy on 9/22. Patient has developed postop ileus. Pharmacy consulted to assist in TPN therapy.   Significant events:  9/29: Had some crampy upper abdominal pain then nausea and vomiting-basically everything he ate yesterday. 10/2: Passing flatus, multiple loose BMs - C.diff pending.  Mild dyspnea when walking - for CT chest.  Leukocytosis persists - for repeat CT of abdomen.  Today 10/2:   Glucose - CBGs < 150  Electrolytes - K improved after IV runs x 4 yesterday but still slightly low  Renal -SCr and BUN WNL  LFTs - ALT slightly elevated but < 2xULN (10/1).   TGs - WNL (9/30)  Prealbumin - 13.9 (9/30)  NUTRITIONAL GOALS                                                                                    RD recs: 1950-2250 Kcal, 120-140g Protein, 1.9-2.2 L/day fluid Clinimix E 5/15 at a goal rate of 14000ml/hr + 20% fat emulsion at ml/hr to provide: 120 g/day protein, 2184 Kcal/day.  PLAN                                                                                                                  1. KCl 10 mEq IV q1h x 2 today. 2. Given return of bowel function anticipate possible diet advancement soon.  Will therefore leave Clinimix at 2L bag size for today.  Continue Clinimix E 5/15 at 80 ml/hr.  Continue 20% fat emulsion at 3210ml/hr.  TNA to contain standard multivitamins and trace elements.  Continue IVF at 10 ml/hr.  Continue sensitive scale SSI .   Continue to follow LFTs with routine TNA labs.  BMet tomorrow - watch K.  TNA lab panels on Mondays & Thursdays.  F/u for possible advancement of diet by MD.  Elie Goodyandy Braylynn Ghan, PharmD, BCPS Pager: [phone] 07/11/2014  8:42 AM

## 2014-07-11 NOTE — Consult Note (Signed)
Reason for consult: abdominal fluid collection drainage  Referring Physician(s): Dr. Elveria Rising  History of Present Illness: [name] [name] is a 48 y.o. male with history of recurrent sigmoid diverticulitis, s/p subtotal colectomy with partial omentectomy/incidental appendectomy 07/01/13. He had done well post op but now presents with rising WBC and finding of fluid collection anterior to body and tail of pancreas with differential including seroma, abscess or pseudocyst. Request is now received for CT guided drainage of this fluid collection. He currently has mild mid abd discomfort but no N/V; also some dyspnea with exertion.  Past Medical History  Diagnosis Date  . Hypertension   . Sarcoidosis   . Hyperlipemia   . Diverticulitis   . Allergy     SEASONAL  . Hypercholesteremia 06/15/2012  . 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 Vertebral artery stenting  2008  . Laparoscopic partial colectomy N/A 07/01/2014    Procedure: LAPAROSCOPIC ASSISTED  SUBTOTAL COLECTOMY INCIDENTAL APPENDECTOMY, PARTIAL OMENTECTOMY AND RIGID PROCTOSCOPY;  Surgeon: Avel Peace, MD;  Location: [facility];  Service: General;  Laterality: N/A;    Allergies: Celebrex; Sulfa antibiotics; Codeine; and Dilaudid  Medications: Prior to Admission medications   Medication Sig Start Date End Date Taking? Authorizing Provider  ciprofloxacin (CIPRO) 500 MG tablet Take 1 tablet (500 mg total) by mouth 2 (two) times daily. 06/18/14  Yes Enid Skeens, MD  diphenhydrAMINE (BENADRYL) 25 MG tablet Take 25 mg by mouth every 6 (six) hours as needed for allergies.    Yes Historical Provider, MD  hyoscyamine (LEVSIN SL) 0.125 MG SL tablet Place 0.125 mg under the tongue every 6 (six) hours as needed for cramping.   Yes Historical Provider, MD  ondansetron (ZOFRAN-ODT) 4 MG disintegrating tablet Take 4 mg by mouth every 4 (four) hours as  needed for nausea or vomiting.   Yes Historical Provider, MD  oxyCODONE-acetaminophen (PERCOCET/ROXICET) 5-325 MG per tablet Take 2 tablets by mouth every 4 (four) hours as needed for severe pain. 03/24/14  Yes Jillyn Ledger, PA-C  oxyCODONE-acetaminophen (PERCOCET/ROXICET) 5-325 MG per tablet Take 1 tablet by mouth every 4 (four) hours as needed for moderate pain or severe pain.   Yes Historical Provider, MD  zolpidem (AMBIEN) 10 MG tablet Take 10 mg by mouth at bedtime as needed for sleep.    Yes Historical Provider, MD  colchicine 0.6 MG tablet Take 0.6 mg by mouth 2 (two) times daily as needed (For gout.).  06/04/14   Historical Provider, MD  losartan-hydrochlorothiazide (HYZAAR) 50-12.5 MG per tablet Take 0.5 tablets by mouth at bedtime.  11/18/13   Historical Provider, MD  metroNIDAZOLE (FLAGYL) 500 MG tablet Take 1 tablet (500 mg total) by mouth 3 (three) times daily. 06/18/14   Enid Skeens, MD  Probiotic Product (PROBIOTIC DAILY PO) Take 1 capsule by mouth at bedtime.    Historical Provider, MD    Family History  Problem Relation Age of Onset  . Emphysema Paternal Grandfather   . Heart disease Paternal Grandfather   . Allergies Mother   . Hypertension Mother   . Skin cancer Father   . Colon cancer Neg Hx     History   Social History  . Marital Status: Married    Spouse Name: N/A    Number of Children: 1  . Years of Education: N/A   Occupational History  . Curator  Social History Main Topics  . Smoking status: Never Smoker   . Smokeless tobacco: Never Used  . Alcohol Use: Yes     Comment: seldom  . Drug Use: No  . Sexual Activity: None   Other Topics Concern  . None   Social History Narrative  . None        Review of Systems see above  Vital Signs: BP 127/76  Pulse 69  Temp(Src) 98 F (36.7 C) (Oral)  Resp 18  Ht 6' (1.829 m)  Wt 227 lb 15.3 oz (103.4 kg)  BMI 30.91 kg/m2  SpO2 100%  Physical Exam pt awake/alert; chest- CTA bilat; heart- RRR;  abd- soft,+BS, intact LLQ surgical drain, mildly tender mid abd region, clean midline incision, ext- no edema.  Imaging: Ct Angio Chest Pe W/cm &/or Wo Cm  07/11/2014   CLINICAL DATA:  Short of breath. Elevated white blood cell count. Partial colectomy. Postoperative dyspnea and leukocytosis.  EXAM: CT ANGIOGRAPHY CHEST  CT ABDOMEN AND PELVIS WITH CONTRAST  TECHNIQUE: Multidetector CT imaging of the chest was performed using the standard protocol during bolus administration of intravenous contrast. Multiplanar CT image reconstructions and MIPs were obtained to evaluate the vascular anatomy. Multidetector CT imaging of the abdomen and pelvis was performed using the standard protocol during bolus administration of intravenous contrast.  CONTRAST:  100mL OMNIPAQUE IOHEXOL 350 MG/ML SOLN  COMPARISON:  01/27/2014.  FINDINGS: CTA CHEST FINDINGS  Bones: Normal.  Cardiovascular: Technically adequate study. No pulmonary embolus. Normal variant 4 vessel aortic arch. Heart appears normal. RIGHT upper extremity PICC is present terminating in the mid SVC.  Lungs: Normal.  No significant atelectasis.  No airspace disease.  Central airways: Patent.  Effusions: None.  Lymphadenopathy: None.  Esophagus: Normal.  CT ABDOMEN and PELVIS FINDINGS  Musculoskeletal:  No aggressive osseous lesions.  Liver: Small low-density lesions are present in the liver compatible with cysts. These are unchanged compared to prior.  Spleen:  Normal.  Gallbladder:  Normal.  Common bile duct:  Normal.  Pancreas: The pancreas itself appears within normal limits. Just anterior to the pancreas, there is a fluid collection with some fat. On axial imaging, the largest measurements are 8 cm x 5 cm craniocaudal extent is 17 cm however this over estimates the true size of the fluid collection because of the configuration. The inferior portion demonstrates peripheral enhancement. No gas is present.  Adrenal glands:  Normal bilaterally.  Kidneys: LEFT renal  cysts. Normal enhancement and delayed excretion of contrast. Both ureters appear within normal limits.  Stomach:  Partially collapsed.  Small bowel: Duodenum appears normal. Progressive dilation of small bowel extending into the anatomic pelvis. In the postoperative period, this is most consistent with ileus. No defined transition point is identified to suggest obstruction.  Colon: Subtotal colectomy. Small portion of the rectosigmoid remains present. Surgical drain is present from a LEFT-sided approach extending into the anatomic pelvis. No pelvic fluid collection/abscess.  Pelvic Genitourinary: Collapsed urinary bladder. Stranding and soft tissue density anterior to the bladder dome probably represents postoperative hematoma.  Vasculature: Circumaortic LEFT renal vein.  Body Wall: Uncomplicated midline incisions site with skin staples.  Review of the MIP images confirms the above findings.  IMPRESSION: 1. Negative for pulmonary embolus or acute abnormality in the chest. 2. Fluid collection anterior to the body and tail of the pancreas. Differential considerations for the fluid collection are seroma, abscess, or pseudocyst in this patient with abdominal surgery. 3. Ileus. 4. Subtotal colectomy with surgical drain  in the anatomic pelvis.   Electronically Signed   By: Andreas Newport M.D.   On: 07/11/2014 11:51   Ct Abdomen Pelvis W Contrast  07/11/2014   CLINICAL DATA:  Short of breath. Elevated white blood cell count. Partial colectomy. Postoperative dyspnea and leukocytosis.  EXAM: CT ANGIOGRAPHY CHEST  CT ABDOMEN AND PELVIS WITH CONTRAST  TECHNIQUE: Multidetector CT imaging of the chest was performed using the standard protocol during bolus administration of intravenous contrast. Multiplanar CT image reconstructions and MIPs were obtained to evaluate the vascular anatomy. Multidetector CT imaging of the abdomen and pelvis was performed using the standard protocol during bolus administration of intravenous  contrast.  CONTRAST:  OMNIPAQUE IOHEXOL 350 MG/ML SOLN  COMPARISON:  01/27/2014.  FINDINGS: CTA CHEST FINDINGS  Bones: Normal.  Cardiovascular: Technically adequate study. No pulmonary embolus. Normal variant 4 vessel aortic arch. Heart appears normal. RIGHT upper extremity PICC is present terminating in the mid SVC.  Lungs: Normal.  No significant atelectasis.  No airspace disease.  Central airways: Patent.  Effusions: None.  Lymphadenopathy: None.  Esophagus: Normal.  CT ABDOMEN and PELVIS FINDINGS  Musculoskeletal:  No aggressive osseous lesions.  Liver: Small low-density lesions are present in the liver compatible with cysts. These are unchanged compared to prior.  Spleen:  Normal.  Gallbladder:  Normal.  Common bile duct:  Normal.  Pancreas: The pancreas itself appears within normal limits. Just anterior to the pancreas, there is a fluid collection with some fat. On axial imaging, the largest measurements are 8 cm x 5 cm craniocaudal extent is 17 cm however this over estimates the true size of the fluid collection because of the configuration. The inferior portion demonstrates peripheral enhancement. No gas is present.  Adrenal glands:  Normal bilaterally.  Kidneys: LEFT renal cysts. Normal enhancement and delayed excretion of contrast. Both ureters appear within normal limits.  Stomach:  Partially collapsed.  Small bowel: Duodenum appears normal. Progressive dilation of small bowel extending into the anatomic pelvis. In the postoperative period, this is most consistent with ileus. No defined transition point is identified to suggest obstruction.  Colon: Subtotal colectomy. Small portion of the rectosigmoid remains present. Surgical drain is present from a LEFT-sided approach extending into the anatomic pelvis. No pelvic fluid collection/abscess.  Pelvic Genitourinary: Collapsed urinary bladder. Stranding and soft tissue density anterior to the bladder dome probably represents postoperative hematoma.   Vasculature: Circumaortic LEFT renal vein.  Body Wall: Uncomplicated midline incisions site with skin staples.  Review of the MIP images confirms the above findings.  IMPRESSION: 1. Negative for pulmonary embolus or acute abnormality in the chest. 2. Fluid collection anterior to the body and tail of the pancreas. Differential considerations for the fluid collection are seroma, abscess, or pseudocyst in this patient with abdominal surgery. 3. Ileus. 4. Subtotal colectomy with surgical drain in the anatomic pelvis.   Electronically Signed   By: Andreas Newport M.D.   On: 07/11/2014 11:51   Ct Abdomen Pelvis W Contrast  07/07/2014   CLINICAL DATA:  Elevated white blood cell count following subtotal colectomy  EXAM: CT ABDOMEN AND PELVIS WITH CONTRAST  TECHNIQUE: Multidetector CT imaging of the abdomen and pelvis was performed using the standard protocol following bolus administration of intravenous contrast.  CONTRAST:  OMNIPAQUE IOHEXOL 300 MG/ML  SOLN  COMPARISON:  03/24/2014  FINDINGS: Lung bases demonstrate small pleural effusion and mild left lower lobe atelectasis.  The liver again demonstrates some scattered hypodensities likely representing cysts. The spleen, gallbladder,  adrenal glands and kidneys are normal in their CT appearance. Delayed images show excretion of contrast material bilaterally. A few small left renal cysts are again noted.  There are changes noted consistent with the history of subtotal colectomy. A surgical drain is noted in the left lower quadrant. There are multiple dilated loops of small bowel identified both within the jejunum and ileum. There are multiple loops of what appear to be proximal ileum which demonstrate significant wall edema and mesenteric stranding. Additionally and irregular fluid collection is noted adjacent to the pancreas and extending inferiorly along mesenteric its largest peripancreatic component measures approximately 5.7 x 4.6 cm. Inferiorly anterior to the  aorta it measures approximately 2.3 x 4.9 cm. It is possible this represents a postoperative fluid collection with the possibility of pancreatitis could not be totally excluded based on the appearance. Correlation with laboratory values is recommended. This may be in part the etiology of the inflammatory changes surrounding the small bowel and subsequent small bowel dilatation. No definitive transition point to correspond with the degree of dilatation is noted.  The bladder is well distended. No acute osseous abnormality is seen.  IMPRESSION: Postoperative changes as described.  There is a fluid collection adjacent to the pancreas which extends inferiorly as described. This is suspicious for pancreatitis with subsequent phlegmon. Correlation with lipase level is recommended. If this is relatively normal this may simply represent a postoperative fluid collection which is not organized into an abscess this time. It is not approach pole [facility] percutaneous technique at this time due to the overlying bowel.  Multiple dilated loops of small bowel which may simply represent postoperative ileus although some edematous small bowel is identified near the junction of the jejunum and ileum although this may be reactive due to the postoperative fluid collection and inflammatory change.  These results were discussed by telephone at the time of interpretation on 07/07/2014 at 12:08 pm to Dr. Avel Peace , who verbally acknowledged these results.   Electronically Signed   By: Alcide Clever M.D.   On: 07/07/2014 12:08    Labs:  CBC:  Recent Labs  07/08/14 0020 07/09/14 0517 07/10/14 0535 07/11/14 0425  WBC 20.9* 17.7* 26.8* 27.6*  HGB 10.8* 9.8* 10.1* 9.8*  HCT 31.2* 28.9* 30.0* 29.8*  PLT 509* PLATELET CLUMPS NOTED ON SMEAR, UNABLE TO ESTIMATE 590* 556*    COAGS:  Recent Labs  06/25/14 1445  INR 1.08    BMP:  Recent Labs  07/07/14 0745 07/09/14 0517 07/10/14 0535 07/11/14 0425  NA 139 139 139 133*    K 3.5* 3.5* 3.4* 3.6*  CL 101 103 104 100  CO2 26 24 20 21   GLUCOSE 122* 138* 154* 132*  BUN 11 13 15 15   CALCIUM 8.3* 8.2* 8.3* 8.4  CREATININE 0.72 1.17 1.23 1.20  GFRNONAA >90 73* 69* 71*  GFRAA >90 85* 80* 82*    LIVER FUNCTION TESTS:  Recent Labs  06/18/14 0910 06/25/14 1445 07/09/14 0517 07/10/14 0535  BILITOT 0.5 0.7 0.4 0.5  AST 24 25 43* 32  ALT 26 33 71* 65*  ALKPHOS 66 69 65 73  PROT 7.4 7.6 5.7* 6.3  ALBUMIN 4.0 4.2 2.2* 2.4*    TUMOR MARKERS: No results found for this basename: AFPTM, CEA, CA199, CHROMGRNA,  in the last 8760 hours  Assessment and Plan: [name] [name] is a 48 y.o. male with history of recurrent sigmoid diverticulitis, s/p subtotal colectomy with partial omentectomy/incidental appendectomy 07/01/13. He had done well post  op but now presents with rising WBC and finding of fluid collection anterior to body and tail of pancreas with differential including seroma, abscess or pseudocyst. Request is now received for CT guided drainage of this fluid collection. He currently has mild mid abd discomfort but no N/V; also some dyspnea with exertion. Imaging studies have been reviewed by Dr. Deanne Coffer. Plan is for CT guided aspiration/possible drainage of the abd fluid collection on 10/3 if safe percutaneous access window is available. Details/risks of procedure d/w pt with his understanding and consent.          I spent a total of 20 minutes face to face in clinical consultation, greater than 50% of which was coordinating care for abdominal fluid collection drainage.  Signed: Chinita Pester 07/11/2014, 5:14 PM

## 2014-07-11 NOTE — Progress Notes (Signed)
C. Diff is negative.  Chest CT is negative for PE or PNA.  CT of abdomen and pelvis demonstrates the peripancreatic fluid collection as before with no gas.  The differential diagnosis includes postoperative abscess or possible evolving pseudocyst as there is dissection near the pancreas during the procedure.  The inferior aspect is slightly rim enhancing.  I discussed this with Dr. Deanne CofferHassell of IR.  He will attempt to drain the collection tomorrow under radiologic guidance.  I have discussed this with [name] [name] who seems to understand and agrees with the plan.

## 2014-07-11 NOTE — Progress Notes (Signed)
Staples removed from mid-abdominal incision and 3 lap sites.  Benzoin and 1/2" steristrips applied.  Note: staple removal delayed this am by pain, nausea, and CT scans.  Pt tolerated staple removal and steristrip application well.  Nurse Clarissa also present and assisting, verifying all staples removed.

## 2014-07-12 ENCOUNTER — Inpatient Hospital Stay (HOSPITAL_COMMUNITY): Payer: BC Managed Care – PPO

## 2014-07-12 ENCOUNTER — Encounter (HOSPITAL_COMMUNITY): Payer: Self-pay | Admitting: Radiology

## 2014-07-12 LAB — CBC
HCT: 28.7 % — ABNORMAL LOW (ref 39.0–52.0)
HCT: 28.7 % — ABNORMAL LOW (ref 39.0–52.0)
HEMOGLOBIN: 9.4 g/dL — AB (ref 13.0–17.0)
Hemoglobin: 9.8 g/dL — ABNORMAL LOW (ref 13.0–17.0)
MCH: 29.6 pg (ref 26.0–34.0)
MCH: 30.4 pg (ref 26.0–34.0)
MCHC: 32.8 g/dL (ref 30.0–36.0)
MCHC: 34.1 g/dL (ref 30.0–36.0)
MCV: 89.1 fL (ref 78.0–100.0)
MCV: 90.3 fL (ref 78.0–100.0)
Platelets: 602 10*3/uL — ABNORMAL HIGH (ref 150–400)
Platelets: 620 10*3/uL — ABNORMAL HIGH (ref 150–400)
RBC: 3.22 MIL/uL — AB (ref 4.22–5.81)
RBC: 3.18 MIL/uL — AB (ref 4.22–5.81)
RDW: 13.4 % (ref 11.5–15.5)
RDW: 13.6 % (ref 11.5–15.5)
WBC: 25.3 10*3/uL — ABNORMAL HIGH (ref 4.0–10.5)
WBC: 25.2 10*3/uL — AB (ref 4.0–10.5)

## 2014-07-12 LAB — GLUCOSE, CAPILLARY
GLUCOSE-CAPILLARY: 155 mg/dL — AB (ref 70–99)
GLUCOSE-CAPILLARY: 104 mg/dL — AB (ref 70–99)
Glucose-Capillary: 139 mg/dL — ABNORMAL HIGH (ref 70–99)

## 2014-07-12 LAB — BASIC METABOLIC PANEL
Anion gap: 12 (ref 5–15)
BUN: 17 mg/dL (ref 6–23)
CO2: 21 mEq/L (ref 19–32)
Calcium: 8.6 mg/dL (ref 8.4–10.5)
Chloride: 101 mEq/L (ref 96–112)
Creatinine, Ser: 1.27 mg/dL (ref 0.50–1.35)
GFR calc Af Amer: 77 mL/min — ABNORMAL LOW (ref >90)
GFR, EST NON AFRICAN AMERICAN: 66 mL/min — AB (ref >90)
Glucose, Bld: 137 mg/dL — ABNORMAL HIGH (ref 70–99)
POTASSIUM: 4.1 meq/L (ref 3.7–5.3)
SODIUM: 134 meq/L — AB (ref 137–147)

## 2014-07-12 LAB — APTT: aPTT: 34 seconds (ref 24–37)

## 2014-07-12 LAB — PROTIME-INR
INR: 1.19 (ref 0.00–1.49)
Prothrombin Time: 15.3 seconds — ABNORMAL HIGH (ref 11.6–15.2)

## 2014-07-12 LAB — AMYLASE, PERITONEAL FLUID: Amylase, peritoneal fluid: 276 U/L

## 2014-07-12 MED ORDER — MORPHINE SULFATE 2 MG/ML IJ SOLN
1.0000 mg | INTRAMUSCULAR | Status: DC | PRN
  Administered 2014-07-12 – 2014-07-19 (×34): 2 mg via INTRAVENOUS
  Filled 2014-07-12 (×7): qty 1
  Filled 2014-07-12: qty 2
  Filled 2014-07-12 (×26): qty 1

## 2014-07-12 MED ORDER — MIDAZOLAM HCL 2 MG/2ML IJ SOLN
INTRAMUSCULAR | Status: AC
  Filled 2014-07-12: qty 6

## 2014-07-12 MED ORDER — FAT EMULSION 20 % IV EMUL
250.0000 mL | INTRAVENOUS | Status: AC
  Administered 2014-07-12: 250 mL via INTRAVENOUS
  Filled 2014-07-12: qty 250

## 2014-07-12 MED ORDER — FENTANYL CITRATE 0.05 MG/ML IJ SOLN
INTRAMUSCULAR | Status: AC
  Filled 2014-07-12: qty 6

## 2014-07-12 MED ORDER — MIDAZOLAM HCL 2 MG/2ML IJ SOLN
INTRAMUSCULAR | Status: AC | PRN
  Administered 2014-07-12 (×2): 1 mg via INTRAVENOUS
  Administered 2014-07-12: 2 mg via INTRAVENOUS

## 2014-07-12 MED ORDER — TRACE MINERALS CR-CU-F-FE-I-MN-MO-SE-ZN IV SOLN
INTRAVENOUS | Status: AC
  Administered 2014-07-12: 18:00:00 via INTRAVENOUS
  Filled 2014-07-12: qty 2000

## 2014-07-12 MED ORDER — FENTANYL CITRATE 0.05 MG/ML IJ SOLN
INTRAMUSCULAR | Status: AC | PRN
  Administered 2014-07-12 (×2): 50 ug via INTRAVENOUS

## 2014-07-12 NOTE — Procedures (Addendum)
CT guided 5622f drain into LUQ collection Opaque red-brown fluid out, sample for GS, C&S, amylase No complication No blood loss. See complete dictation in [facility].

## 2014-07-12 NOTE — Progress Notes (Signed)
PARENTERAL NUTRITION CONSULT NOTE  Pharmacy Consult for TNA Indication: Prolonged postoperative ileus  Allergies  Allergen Reactions  . Celebrex [Celecoxib]     Mother is allergic, so I do not take it  . Sulfa Antibiotics Nausea And Vomiting  . Codeine Rash  . Dilaudid [Hydromorphone Hcl] Nausea And Vomiting and Anxiety   Patient Measurements: Height: 6' (182.9 cm) Weight: 227 lb 15.3 oz (103.4 kg) IBW/kg (Calculated) : 77.6  Vital Signs: Temp: 98.6 F (37 C) (10/03 0616) Temp Source: Oral (10/03 0616) BP: 111/62 mmHg (10/03 0616) Pulse Rate: 70 (10/03 0616) Intake/Output from previous day: 10/02 0701 - 10/03 0700 In: 1560 [P.O.:480; TPN:1080] Out: 40 [Drains:40] Intake/Output from this shift:    Labs:  Recent Labs  07/11/14 0425 07/12/14 0005 07/12/14 0508  WBC 27.6* 25.2* 25.3*  HGB 9.8* 9.8* 9.4*  HCT 29.8* 28.7* 28.7*  PLT 556* 620* 602*  APTT  --   --  34  INR  --   --  1.19    Recent Labs  07/10/14 0535 07/11/14 0425 07/12/14 0508  NA 139 133* 134*  K 3.4* 3.6* 4.1  CL 104 100 101  CO2 20 21 21   GLUCOSE 154* 132* 137*  BUN 15 15 17   CREATININE 1.23 1.20 1.27  CALCIUM 8.3* 8.4 8.6  MG 2.5  --   --   PHOS 4.3  --   --   PROT 6.3  --   --   ALBUMIN 2.4*  --   --   AST 32  --   --   ALT 65*  --   --   ALKPHOS 73  --   --   BILITOT 0.5  --   --   corrected Ca 9.7 Estimated Creatinine Clearance: 90.4 ml/min (by C-G formula based on Cr of 1.27).    Recent Labs  07/11/14 1739 07/11/14 2353 07/12/14 0634  GLUCAP 116* 128* 155*   Medical History: Past Medical History  Diagnosis Date  . Hypertension   . Sarcoidosis   . Hyperlipemia   . Diverticulitis   . Allergy     SEASONAL  . Hypercholesteremia 06/15/2012  . 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 feeding supplement (RESOURCE BREEZE)  1 Container Oral TID BM  . fentaNYL      . insulin aspart  0-9 Units Subcutaneous 4 times  per day  . midazolam      . pantoprazole (PROTONIX) IV  40 mg Intravenous QHS   Infusions:  . Marland KitchenTPN (CLINIMIX-E) Adult 80 mL/hr at 07/11/14 1748   And  . fat emulsion 250 mL (07/11/14 1748)   Insulin Requirements in past 24 hrs: 5 units SSI (no hx of DM)  Current Nutrition:  NPO for fluid collection drainage in IR today Clinimix-E 5/15 at 80 mL/hr Fat Emulsion 20% at 10 mL/hr  IVF: None  Central access: PICC placed 9/29 TPN start date: 9/29  ASSESSMENT  HPI:  [id number] yoM with recurrent sigmoid diverticulitis. S/p lap assisted subtotal colectomy on 9/22. Patient has developed postop ileus. Pharmacy consulted to assist in TPN therapy.   Significant events:  9/29: Had some crampy upper abdominal pain then nausea and vomiting-basically everything he ate yesterday. 10/2: Passing flatus, multiple loose BMs - C.diff pending.  Mild dyspnea when walking - for CT chest.  Leukocytosis persists - for repeat CT of abdomen. 10/3: plan abd fluid collection drainage in IR  Today 10/3:   Glucose - CBGs avg < 150  Electrolytes - K 4.1 after repletion IV  Renal -SCr and BUN WNL  LFTs - ALT slightly elevated but < 2xULN (10/1).   TGs - WNL (9/30)  Prealbumin - 13.9 (9/30)  NUTRITIONAL GOALS                                                                                    RD recs: 1950-2250 Kcal, 120-140g Protein, 1.9-2.2 L/day fluid Clinimix E 5/15 at a goal rate of 18000ml/hr + 20% fat emulsion at ml/hr to provide: 120 g/day protein, 2184 Kcal/day.  PLAN                                                                                                                  Given return of bowel function anticipate resumption of diet, then advancement.  Will therefore leave Clinimix at 2L bag size for today.  Continue Clinimix E 5/15 at 80 ml/hr.  Continue 20% fat emulsion at 3610ml/hr.  TNA to  contain standard multivitamins and trace elements.  Continue sensitive scale SSI .   Continue to follow LFTs with routine TNA labs.  TNA lab panels on Mondays & Thursdays.  BMET in am, monitor Kirtland BouchardK  Otho BellowsGreen, Divon Krabill L PharmD Pager [phone] 07/12/2014, 8:07 AM

## 2014-07-12 NOTE — Progress Notes (Signed)
Patient ID: [name] CampanileDonald S Franta, male   DOB: 02/15/1966, 48 y.o.   MRN: [id number]  General Surgery - [facility] - Progress Note  Subjective: Patient back to room from IR - drain placed - see procedure note.  Family at bedside.  Mild pain at drain site.  Wants to eat.  Objective: Vital signs in last 24 hours: Temp:  [98 F (36.7 C)-98.7 F (37.1 C)] 98.7 F (37.1 C) (10/03 0945) Pulse Rate:  [69-83] 77 (10/03 0945) Resp:  [16-26] 16 (10/03 0945) BP: (92-127)/(55-76) 113/68 mmHg (10/03 0945) SpO2:  [96 %-100 %] 97 % (10/03 0945) Last BM Date: 07/12/14  Intake/Output from previous day: 10/02 0701 - 10/03 0700 In: 1560 [P.O.:480; TPN:1080] Out: 40 [Drains:40]  Exam: HEENT - clear, not icteric Neck - soft Chest - clear bilaterally Cor - RRR, no murmur Abd - soft without distension; JP drain with thin serosanguinous; new drain left flank with cloudy brownish fluid Ext - no significant edema Neuro - grossly intact, no focal deficits  Lab Results:   Recent Labs  07/12/14 0005 07/12/14 0508  WBC 25.2* 25.3*  HGB 9.8* 9.4*  HCT 28.7* 28.7*  PLT 620* 602*     Recent Labs  07/11/14 0425 07/12/14 0508  NA 133* 134*  K 3.6* 4.1  CL 100 101  CO2 21 21  GLUCOSE 132* 137*  BUN 15 17  CREATININE 1.20 1.27  CALCIUM 8.4 8.6    Studies/Results: Ct Angio Chest Pe W/cm &/or Wo Cm  07/11/2014   CLINICAL DATA:  Short of breath. Elevated white blood cell count. Partial colectomy. Postoperative dyspnea and leukocytosis.  EXAM: CT ANGIOGRAPHY CHEST  CT ABDOMEN AND PELVIS WITH CONTRAST  TECHNIQUE: Multidetector CT imaging of the chest was performed using the standard protocol during bolus administration of intravenous contrast. Multiplanar CT image reconstructions and MIPs were obtained to evaluate the vascular anatomy. Multidetector CT imaging of the abdomen and pelvis was performed using the standard protocol during bolus administration of intravenous contrast.   CONTRAST:  100mL OMNIPAQUE IOHEXOL 350 MG/ML SOLN  COMPARISON:  01/27/2014.  FINDINGS: CTA CHEST FINDINGS  Bones: Normal.  Cardiovascular: Technically adequate study. No pulmonary embolus. Normal variant 4 vessel aortic arch. Heart appears normal. RIGHT upper extremity PICC is present terminating in the mid SVC.  Lungs: Normal.  No significant atelectasis.  No airspace disease.  Central airways: Patent.  Effusions: None.  Lymphadenopathy: None.  Esophagus: Normal.  CT ABDOMEN and PELVIS FINDINGS  Musculoskeletal:  No aggressive osseous lesions.  Liver: Small low-density lesions are present in the liver compatible with cysts. These are unchanged compared to prior.  Spleen:  Normal.  Gallbladder:  Normal.  Common bile duct:  Normal.  Pancreas: The pancreas itself appears within normal limits. Just anterior to the pancreas, there is a fluid collection with some fat. On axial imaging, the largest measurements are 8 cm x 5 cm craniocaudal extent is 17 cm however this over estimates the true size of the fluid collection because of the configuration. The inferior portion demonstrates peripheral enhancement. No gas is present.  Adrenal glands:  Normal bilaterally.  Kidneys: LEFT renal cysts. Normal enhancement and delayed excretion of contrast. Both ureters appear within normal limits.  Stomach:  Partially collapsed.  Small bowel: Duodenum appears normal. Progressive dilation of small bowel extending into the anatomic pelvis. In the postoperative period, this is most consistent with ileus. No defined transition point is identified to suggest obstruction.  Colon: Subtotal colectomy. Small portion  of the rectosigmoid remains present. Surgical drain is present from a LEFT-sided approach extending into the anatomic pelvis. No pelvic fluid collection/abscess.  Pelvic Genitourinary: Collapsed urinary bladder. Stranding and soft tissue density anterior to the bladder dome probably represents postoperative hematoma.  Vasculature:  Circumaortic LEFT renal vein.  Body Wall: Uncomplicated midline incisions site with skin staples.  Review of the MIP images confirms the above findings.  IMPRESSION: 1. Negative for pulmonary embolus or acute abnormality in the chest. 2. Fluid collection anterior to the body and tail of the pancreas. Differential considerations for the fluid collection are seroma, abscess, or pseudocyst in this patient with abdominal surgery. 3. Ileus. 4. Subtotal colectomy with surgical drain in the anatomic pelvis.   Electronically Signed   By: Andreas Newport M.D.   On: 07/11/2014 11:51   Ct Abdomen Pelvis W Contrast  07/11/2014   CLINICAL DATA:  Short of breath. Elevated white blood cell count. Partial colectomy. Postoperative dyspnea and leukocytosis.  EXAM: CT ANGIOGRAPHY CHEST  CT ABDOMEN AND PELVIS WITH CONTRAST  TECHNIQUE: Multidetector CT imaging of the chest was performed using the standard protocol during bolus administration of intravenous contrast. Multiplanar CT image reconstructions and MIPs were obtained to evaluate the vascular anatomy. Multidetector CT imaging of the abdomen and pelvis was performed using the standard protocol during bolus administration of intravenous contrast.  CONTRAST:  OMNIPAQUE IOHEXOL 350 MG/ML SOLN  COMPARISON:  01/27/2014.  FINDINGS: CTA CHEST FINDINGS  Bones: Normal.  Cardiovascular: Technically adequate study. No pulmonary embolus. Normal variant 4 vessel aortic arch. Heart appears normal. RIGHT upper extremity PICC is present terminating in the mid SVC.  Lungs: Normal.  No significant atelectasis.  No airspace disease.  Central airways: Patent.  Effusions: None.  Lymphadenopathy: None.  Esophagus: Normal.  CT ABDOMEN and PELVIS FINDINGS  Musculoskeletal:  No aggressive osseous lesions.  Liver: Small low-density lesions are present in the liver compatible with cysts. These are unchanged compared to prior.  Spleen:  Normal.  Gallbladder:  Normal.  Common bile duct:  Normal.   Pancreas: The pancreas itself appears within normal limits. Just anterior to the pancreas, there is a fluid collection with some fat. On axial imaging, the largest measurements are 8 cm x 5 cm craniocaudal extent is 17 cm however this over estimates the true size of the fluid collection because of the configuration. The inferior portion demonstrates peripheral enhancement. No gas is present.  Adrenal glands:  Normal bilaterally.  Kidneys: LEFT renal cysts. Normal enhancement and delayed excretion of contrast. Both ureters appear within normal limits.  Stomach:  Partially collapsed.  Small bowel: Duodenum appears normal. Progressive dilation of small bowel extending into the anatomic pelvis. In the postoperative period, this is most consistent with ileus. No defined transition point is identified to suggest obstruction.  Colon: Subtotal colectomy. Small portion of the rectosigmoid remains present. Surgical drain is present from a LEFT-sided approach extending into the anatomic pelvis. No pelvic fluid collection/abscess.  Pelvic Genitourinary: Collapsed urinary bladder. Stranding and soft tissue density anterior to the bladder dome probably represents postoperative hematoma.  Vasculature: Circumaortic LEFT renal vein.  Body Wall: Uncomplicated midline incisions site with skin staples.  Review of the MIP images confirms the above findings.  IMPRESSION: 1. Negative for pulmonary embolus or acute abnormality in the chest. 2. Fluid collection anterior to the body and tail of the pancreas. Differential considerations for the fluid collection are seroma, abscess, or pseudocyst in this patient with abdominal surgery. 3. Ileus. 4. Subtotal  colectomy with surgical drain in the anatomic pelvis.   Electronically Signed   By: Andreas Newport M.D.   On: 07/11/2014 11:51   Ct Image Guided Drainage By Percutaneous Catheter  07/12/2014   CLINICAL DATA:  Recent subtotal colectomy for recurrent diverticulitis . Elevated postop white  blood cell count. CT demonstrates elongated left retroperitoneal fluid collection anterior to the pancreatic body and extending inferiorly to the level of the IMA origin  EXAM: CT GUIDED DRAINAGE OF RETROPERITONEAL ABSCESS  ANESTHESIA/SEDATION: Intravenous Fentanyl and Versed were administered as conscious sedation during continuous cardiorespiratory monitoring by the radiology RN, with a total moderate sedation time of 22 minutes.  PROCEDURE: The procedure, risks, benefits, and alternatives were explained to the patient. Questions regarding the procedure were encouraged and answered. The patient understands and consents to the procedure.  Patient placed in right lateral decubitus position. Select axial scans through the upper abdomen were obtained and appropriate skin age site was determined. Skin was marked.  The operative field was prepped with Betadinein a sterile fashion, and a sterile drape was applied covering the operative field. A sterile gown and sterile gloves were used for the procedure. Local anesthesia was provided with 1% Lidocaine.  Under CT fluoroscopic guidance, an 18 gauge trocar needle was advanced to the margin of the collection. Opaque tan fluid could be aspirated. An Amplatz wire advanced easily within the collection, position confirmed on CT fluoroscopy. Track dilated to facilitate placement of a 12 French pigtail catheter, formed within the central aspect of the collection. 10 mL sample aspirated, sent for Gram stain, culture, and amylase. Catheter secured externally with 0 Prolene suture and placed to gravity bag. The patient tolerated the procedure well.  COMPLICATIONS: None  FINDINGS: Left retroperitoneal collection was again localized. An appropriate approach was determined, and a 12 French pigtail catheter placed under CT fluoroscopy. Samples sent for Gram stain, culture, and amylase.  IMPRESSION: 1. Technically successful CT-guided retroperitoneal abscess drain catheter placement.    Electronically Signed   By: Oley Balm M.D.   On: 07/12/2014 09:47    Assessment / Plan: 1.  Status post lap assisted partial colectomy  IR placed drain in LUQ fluid collection this AM - cultures pending  Monitor WBC  Resume clear liquid diet  Continue TNA for now  Ambulate  Velora Heckler, MD, [facility] Office: [phone]  07/12/2014

## 2014-07-13 LAB — GLUCOSE, CAPILLARY
GLUCOSE-CAPILLARY: 128 mg/dL — AB (ref 70–99)
Glucose-Capillary: 112 mg/dL — ABNORMAL HIGH (ref 70–99)
Glucose-Capillary: 135 mg/dL — ABNORMAL HIGH (ref 70–99)
Glucose-Capillary: 131 mg/dL — ABNORMAL HIGH (ref 70–99)

## 2014-07-13 LAB — CBC
HCT: 29 % — ABNORMAL LOW (ref 39.0–52.0)
Hemoglobin: 9.7 g/dL — ABNORMAL LOW (ref 13.0–17.0)
MCH: 29.9 pg (ref 26.0–34.0)
MCHC: 33.4 g/dL (ref 30.0–36.0)
MCV: 89.5 fL (ref 78.0–100.0)
PLATELETS: 695 10*3/uL — AB (ref 150–400)
RBC: 3.24 MIL/uL — AB (ref 4.22–5.81)
RDW: 13.7 % (ref 11.5–15.5)
WBC: 21.2 10*3/uL — AB (ref 4.0–10.5)

## 2014-07-13 LAB — BASIC METABOLIC PANEL WITH GFR
Anion gap: 13 (ref 5–15)
BUN: 16 mg/dL (ref 6–23)
CO2: 20 meq/L (ref 19–32)
Calcium: 8.6 mg/dL (ref 8.4–10.5)
Chloride: 102 meq/L (ref 96–112)
Creatinine, Ser: 1.21 mg/dL (ref 0.50–1.35)
GFR calc Af Amer: 81 mL/min — ABNORMAL LOW
GFR calc non Af Amer: 70 mL/min — ABNORMAL LOW
Glucose, Bld: 144 mg/dL — ABNORMAL HIGH (ref 70–99)
Potassium: 4.4 meq/L (ref 3.7–5.3)
Sodium: 135 meq/L — ABNORMAL LOW (ref 137–147)

## 2014-07-13 MED ORDER — FAT EMULSION 20 % IV EMUL
250.0000 mL | INTRAVENOUS | Status: AC
  Administered 2014-07-13: 250 mL via INTRAVENOUS
  Filled 2014-07-13: qty 250

## 2014-07-13 MED ORDER — TRACE MINERALS CR-CU-F-FE-I-MN-MO-SE-ZN IV SOLN
INTRAVENOUS | Status: AC
  Administered 2014-07-13: 18:00:00 via INTRAVENOUS
  Filled 2014-07-13: qty 2000

## 2014-07-13 NOTE — Progress Notes (Signed)
PARENTERAL NUTRITION CONSULT NOTE  Pharmacy Consult for TNA Indication: Prolonged postoperative ileus  Allergies  Allergen Reactions  . Celebrex [Celecoxib]     Mother is allergic, so I do not take it  . Sulfa Antibiotics Nausea And Vomiting  . Codeine Rash  . Dilaudid [Hydromorphone Hcl] Nausea And Vomiting and Anxiety   Patient Measurements: Height: 6' (182.9 cm) Weight: 227 lb 15.3 oz (103.4 kg) IBW/kg (Calculated) : 77.6  Vital Signs: Temp: 99.3 F (37.4 C) (10/04 0457) Temp Source: Oral (10/04 0457) BP: 128/71 mmHg (10/04 0457) Pulse Rate: 78 (10/04 0457) Intake/Output from previous day: 10/03 0701 - 10/04 0700 In: 2367.5 [P.O.:360; I.V.:552.5; TPN:1440] Out: 210 [Drains:210] Intake/Output from this shift:   Labs:  Recent Labs  07/12/14 0005 07/12/14 0508 07/13/14 0430  WBC 25.2* 25.3* 21.2*  HGB 9.8* 9.4* 9.7*  HCT 28.7* 28.7* 29.0*  PLT 620* 602* 695*  APTT  --  34  --   INR  --  1.19  --     Recent Labs  07/11/14 0425 07/12/14 0508 07/13/14 0430  NA 133* 134* 135*  K 3.6* 4.1 4.4  CL 100 101 102  CO2 21 21 20   GLUCOSE 132* 137* 144*  BUN 15 17 16   CREATININE 1.20 1.27 1.21  CALCIUM 8.4 8.6 8.6  corrected Ca 9.7 Estimated Creatinine Clearance: 94.8 ml/min (by C-G formula based on Cr of 1.21).    Recent Labs  07/12/14 1753 07/13/14 0009 07/13/14 0455  GLUCAP 104* 112* 128*   Medical History: Past Medical History  Diagnosis Date  . Hypertension   . Sarcoidosis   . Hyperlipemia   . Diverticulitis   . Allergy     SEASONAL  . Hypercholesteremia 06/15/2012  . 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 feeding supplement (RESOURCE BREEZE)  1 Container Oral TID BM  . insulin aspart  0-9 Units Subcutaneous 4 times per day  . pantoprazole (PROTONIX) IV  40 mg Intravenous QHS   Infusions:  . Marland Kitchen.TPN (CLINIMIX-E) Adult 80 mL/hr at 07/12/14 1815   And  . fat emulsion 250 mL (07/12/14  1815)   Insulin Requirements in past 24 hrs: 5 units SSI (no hx of DM)  Current Nutrition:  CL diet Clinimix-E 5/15 at 80 mL/hr Fat Emulsion 20% at 10 mL/hr  IVF: None  Central access: PICC placed 9/29 TPN start date: 9/29  ASSESSMENT                                                                                                          HPI:  [id number] yoM with recurrent sigmoid diverticulitis. S/p lap assisted subtotal colectomy on 9/22. Patient has developed postop ileus. Pharmacy consulted to assist in TPN therapy.   Significant events:  9/29: Had some crampy upper abdominal pain then nausea and vomiting-basically everything he ate yesterday. 10/2: Passing flatus, multiple loose BMs - C.diff pending.  Mild dyspnea when walking - for CT chest.  Leukocytosis persists - for repeat CT of  abdomen. 10/3:2nd drain placed in IR for abd fluid collection  10/4: 165 ml out drain placed 10/3, total 45 ml out drain placed 11 days ago  Today 10/4:   Glucose - CBGs avg < 150  Electrolytes - lytes wnl, K repleted yesterday  Renal -SCr and BUN WNL  LFTs - ALT slightly elevated but < 2xULN (10/1).   TGs - WNL (9/30)  Prealbumin - 13.9 (9/30)  NUTRITIONAL GOALS                                                                                    RD recs: 1950-2250 Kcal, 120-140g Protein, 1.9-2.2 L/day fluid Clinimix E 5/15 at a goal rate of 192ml/hr + 20% fat emulsion at ml/hr to provide: 120 g/day protein, 2184 Kcal/day.  PLAN                                                                                                                  Continue Clinimix E 5/15 at 80 ml/hr, no rate advancement with CL diet  Continue 20% fat emulsion at 14ml/hr.  TNA to contain standard multivitamins and trace elements.  Continue sensitive scale SSI .   Continue to follow LFTs with routine TNA labs.  TNA lab panels on Mondays & Thursdays.  Otho Bellows PharmD Pager [phone] 07/13/2014, 8:53  AM

## 2014-07-13 NOTE — Progress Notes (Signed)
Patient ID: [name] [name], male   DOB: 08-20-1966, 48 y.o.   MRN: [id number]  General Surgery - [facility] - Progress Note  POD# 12  Subjective: Patient with some left flank pain from drain.  No nausea or emesis.  Tolerating clear liquid diet but does not want to advance today.  Ambulated.  Objective: Vital signs in last 24 hours: Temp:  [98.7 F (37.1 C)-99.8 F (37.7 C)] 99.3 F (37.4 C) (10/04 0457) Pulse Rate:  [73-86] 78 (10/04 0457) Resp:  [16-26] 17 (10/04 0457) BP: (92-131)/(55-74) 128/71 mmHg (10/04 0457) SpO2:  [0 %-100 %] 97 % (10/04 0457) Last BM Date: 07/13/14  Intake/Output from previous day: 10/03 0701 - 10/04 0700 In: 2367.5 [P.O.:360; I.V.:552.5; TPN:1440] Out: 210 [Drains:210]  Exam: HEENT - clear, not icteric Abd - soft, mild distension; quiet; JP with serous output; left flank drain with thin brownish fluid, small Ext - no significant edema Neuro - grossly intact, no focal deficits  Lab Results:   Recent Labs  07/12/14 0508 07/13/14 0430  WBC 25.3* 21.2*  HGB 9.4* 9.7*  HCT 28.7* 29.0*  PLT 602* 695*     Recent Labs  07/12/14 0508 07/13/14 0430  NA 134* 135*  K 4.1 4.4  CL 101 102  CO2 21 20  GLUCOSE 137* 144*  BUN 17 16  CREATININE 1.27 1.21  CALCIUM 8.6 8.6    Studies/Results: Ct Angio Chest Pe W/cm &/or Wo Cm  07/11/2014   CLINICAL DATA:  Short of breath. Elevated white blood cell count. Partial colectomy. Postoperative dyspnea and leukocytosis.  EXAM: CT ANGIOGRAPHY CHEST  CT ABDOMEN AND PELVIS WITH CONTRAST  TECHNIQUE: Multidetector CT imaging of the chest was performed using the standard protocol during bolus administration of intravenous contrast. Multiplanar CT image reconstructions and MIPs were obtained to evaluate the vascular anatomy. Multidetector CT imaging of the abdomen and pelvis was performed using the standard protocol during bolus administration of intravenous contrast.  CONTRAST:   OMNIPAQUE IOHEXOL 350 MG/ML SOLN  COMPARISON:  01/27/2014.  FINDINGS: CTA CHEST FINDINGS  Bones: Normal.  Cardiovascular: Technically adequate study. No pulmonary embolus. Normal variant 4 vessel aortic arch. Heart appears normal. RIGHT upper extremity PICC is present terminating in the mid SVC.  Lungs: Normal.  No significant atelectasis.  No airspace disease.  Central airways: Patent.  Effusions: None.  Lymphadenopathy: None.  Esophagus: Normal.  CT ABDOMEN and PELVIS FINDINGS  Musculoskeletal:  No aggressive osseous lesions.  Liver: Small low-density lesions are present in the liver compatible with cysts. These are unchanged compared to prior.  Spleen:  Normal.  Gallbladder:  Normal.  Common bile duct:  Normal.  Pancreas: The pancreas itself appears within normal limits. Just anterior to the pancreas, there is a fluid collection with some fat. On axial imaging, the largest measurements are 8 cm x 5 cm craniocaudal extent is 17 cm however this over estimates the true size of the fluid collection because of the configuration. The inferior portion demonstrates peripheral enhancement. No gas is present.  Adrenal glands:  Normal bilaterally.  Kidneys: LEFT renal cysts. Normal enhancement and delayed excretion of contrast. Both ureters appear within normal limits.  Stomach:  Partially collapsed.  Small bowel: Duodenum appears normal. Progressive dilation of small bowel extending into the anatomic pelvis. In the postoperative period, this is most consistent with ileus. No defined transition point is identified to suggest obstruction.  Colon: Subtotal colectomy. Small portion of the rectosigmoid remains present. Surgical drain is  present from a LEFT-sided approach extending into the anatomic pelvis. No pelvic fluid collection/abscess.  Pelvic Genitourinary: Collapsed urinary bladder. Stranding and soft tissue density anterior to the bladder dome probably represents postoperative hematoma.  Vasculature: Circumaortic LEFT  renal vein.  Body Wall: Uncomplicated midline incisions site with skin staples.  Review of the MIP images confirms the above findings.  IMPRESSION: 1. Negative for pulmonary embolus or acute abnormality in the chest. 2. Fluid collection anterior to the body and tail of the pancreas. Differential considerations for the fluid collection are seroma, abscess, or pseudocyst in this patient with abdominal surgery. 3. Ileus. 4. Subtotal colectomy with surgical drain in the anatomic pelvis.   Electronically Signed   By: Andreas Newport M.D.   On: 07/11/2014 11:51   Ct Abdomen Pelvis W Contrast  07/11/2014   CLINICAL DATA:  Short of breath. Elevated white blood cell count. Partial colectomy. Postoperative dyspnea and leukocytosis.  EXAM: CT ANGIOGRAPHY CHEST  CT ABDOMEN AND PELVIS WITH CONTRAST  TECHNIQUE: Multidetector CT imaging of the chest was performed using the standard protocol during bolus administration of intravenous contrast. Multiplanar CT image reconstructions and MIPs were obtained to evaluate the vascular anatomy. Multidetector CT imaging of the abdomen and pelvis was performed using the standard protocol during bolus administration of intravenous contrast.  CONTRAST:  OMNIPAQUE IOHEXOL 350 MG/ML SOLN  COMPARISON:  01/27/2014.  FINDINGS: CTA CHEST FINDINGS  Bones: Normal.  Cardiovascular: Technically adequate study. No pulmonary embolus. Normal variant 4 vessel aortic arch. Heart appears normal. RIGHT upper extremity PICC is present terminating in the mid SVC.  Lungs: Normal.  No significant atelectasis.  No airspace disease.  Central airways: Patent.  Effusions: None.  Lymphadenopathy: None.  Esophagus: Normal.  CT ABDOMEN and PELVIS FINDINGS  Musculoskeletal:  No aggressive osseous lesions.  Liver: Small low-density lesions are present in the liver compatible with cysts. These are unchanged compared to prior.  Spleen:  Normal.  Gallbladder:  Normal.  Common bile duct:  Normal.  Pancreas: The  pancreas itself appears within normal limits. Just anterior to the pancreas, there is a fluid collection with some fat. On axial imaging, the largest measurements are 8 cm x 5 cm craniocaudal extent is 17 cm however this over estimates the true size of the fluid collection because of the configuration. The inferior portion demonstrates peripheral enhancement. No gas is present.  Adrenal glands:  Normal bilaterally.  Kidneys: LEFT renal cysts. Normal enhancement and delayed excretion of contrast. Both ureters appear within normal limits.  Stomach:  Partially collapsed.  Small bowel: Duodenum appears normal. Progressive dilation of small bowel extending into the anatomic pelvis. In the postoperative period, this is most consistent with ileus. No defined transition point is identified to suggest obstruction.  Colon: Subtotal colectomy. Small portion of the rectosigmoid remains present. Surgical drain is present from a LEFT-sided approach extending into the anatomic pelvis. No pelvic fluid collection/abscess.  Pelvic Genitourinary: Collapsed urinary bladder. Stranding and soft tissue density anterior to the bladder dome probably represents postoperative hematoma.  Vasculature: Circumaortic LEFT renal vein.  Body Wall: Uncomplicated midline incisions site with skin staples.  Review of the MIP images confirms the above findings.  IMPRESSION: 1. Negative for pulmonary embolus or acute abnormality in the chest. 2. Fluid collection anterior to the body and tail of the pancreas. Differential considerations for the fluid collection are seroma, abscess, or pseudocyst in this patient with abdominal surgery. 3. Ileus. 4. Subtotal colectomy with surgical drain in the anatomic pelvis.  Electronically Signed   By: Andreas NewportGeoffrey  Lamke M.D.   On: 07/11/2014 11:51   Ct Image Guided Drainage By Percutaneous Catheter  07/12/2014   CLINICAL DATA:  Recent subtotal colectomy for recurrent diverticulitis . Elevated postop white blood cell  count. CT demonstrates elongated left retroperitoneal fluid collection anterior to the pancreatic body and extending inferiorly to the level of the IMA origin  EXAM: CT GUIDED DRAINAGE OF RETROPERITONEAL ABSCESS  ANESTHESIA/SEDATION: Intravenous Fentanyl and Versed were administered as conscious sedation during continuous cardiorespiratory monitoring by the radiology RN, with a total moderate sedation time of 22 minutes.  PROCEDURE: The procedure, risks, benefits, and alternatives were explained to the patient. Questions regarding the procedure were encouraged and answered. The patient understands and consents to the procedure.  Patient placed in right lateral decubitus position. Select axial scans through the upper abdomen were obtained and appropriate skin age site was determined. Skin was marked.  The operative field was prepped with Betadinein a sterile fashion, and a sterile drape was applied covering the operative field. A sterile gown and sterile gloves were used for the procedure. Local anesthesia was provided with 1% Lidocaine.  Under CT fluoroscopic guidance, an 18 gauge trocar needle was advanced to the margin of the collection. Opaque tan fluid could be aspirated. An Amplatz wire advanced easily within the collection, position confirmed on CT fluoroscopy. Track dilated to facilitate placement of a 12 French pigtail catheter, formed within the central aspect of the collection. 10 mL sample aspirated, sent for Gram stain, culture, and amylase. Catheter secured externally with 0 Prolene suture and placed to gravity bag. The patient tolerated the procedure well.  COMPLICATIONS: None  FINDINGS: Left retroperitoneal collection was again localized. An appropriate approach was determined, and a 12 French pigtail catheter placed under CT fluoroscopy. Samples sent for Gram stain, culture, and amylase.  IMPRESSION: 1. Technically successful CT-guided retroperitoneal abscess drain catheter placement.    Electronically Signed   By: Oley Balmaniel  Hassell M.D.   On: 07/12/2014 09:47    Assessment / Plan: 1.  Status post lap partial colectomy  WBC improving with drainage procedure - cultures pending - NOT on abx  Will check CBC in AM 10/5  Continue clear liquid diet  Continue TNA  Ambulate  Velora Hecklerodd M. Jazlynne Milliner, MD, [facility] Office: [phone]  07/13/2014

## 2014-07-13 NOTE — Progress Notes (Signed)
Patient ID: [name] [name], male   DOB: [birth year], 48 y.o.   MRN: [id number]   Referring Physician(s): CCS  Subjective: pt sitting up in chair; c/o left flank discomfort at drain site; no sig appetite; denies N/V    Allergies: Celebrex; Sulfa antibiotics; Codeine; and Dilaudid  Medications: Prior to Admission medications   Medication Sig Start Date End Date Taking? Authorizing Provider  ciprofloxacin (CIPRO) 500 MG tablet Take 1 tablet (500 mg total) by mouth 2 (two) times daily. 06/18/14  Yes Enid Skeens, MD  diphenhydrAMINE (BENADRYL) 25 MG tablet Take 25 mg by mouth every 6 (six) hours as needed for allergies.    Yes Historical Provider, MD  hyoscyamine (LEVSIN SL) 0.125 MG SL tablet Place 0.125 mg under the tongue every 6 (six) hours as needed for cramping.   Yes Historical Provider, MD  ondansetron (ZOFRAN-ODT) 4 MG disintegrating tablet Take 4 mg by mouth every 4 (four) hours as needed for nausea or vomiting.   Yes Historical Provider, MD  oxyCODONE-acetaminophen (PERCOCET/ROXICET) 5-325 MG per tablet Take 2 tablets by mouth every 4 (four) hours as needed for severe pain. 03/24/14  Yes Jillyn Ledger, PA-C  oxyCODONE-acetaminophen (PERCOCET/ROXICET) 5-325 MG per tablet Take 1 tablet by mouth every 4 (four) hours as needed for moderate pain or severe pain.   Yes Historical Provider, MD  zolpidem (AMBIEN) 10 MG tablet Take 10 mg by mouth at bedtime as needed for sleep.    Yes Historical Provider, MD  colchicine 0.6 MG tablet Take 0.6 mg by mouth 2 (two) times daily as needed (For gout.).  06/04/14   Historical Provider, MD  losartan-hydrochlorothiazide (HYZAAR) 50-12.5 MG per tablet Take 0.5 tablets by mouth at bedtime.  11/18/13   Historical Provider, MD  metroNIDAZOLE (FLAGYL) 500 MG tablet Take 1 tablet (500 mg total) by mouth 3 (three) times daily. 06/18/14   Enid Skeens, MD  Probiotic Product (PROBIOTIC DAILY PO) Take 1 capsule by mouth at bedtime.    Historical Provider, MD     Review of Systems  see above  Vital Signs: BP 128/71  Pulse 78  Temp(Src) 99.3 F (37.4 C) (Oral)  Resp 17  Ht 6' (1.829 m)  Wt 227 lb 15.3 oz (103.4 kg)  BMI 30.91 kg/m2  SpO2 97%  Physical Exam pt awake/alert; left flank/RP drain intact, insertion site ok, mild-mod tender, output -165 cc's brown fluid; cx's pending (gm - rods); c diff neg; drain fluid amylase 276; intact LLQ surgical drain  Imaging: Ct Angio Chest Pe W/cm &/or Wo Cm  07/11/2014   CLINICAL DATA:  Short of breath. Elevated white blood cell count. Partial colectomy. Postoperative dyspnea and leukocytosis.  EXAM: CT ANGIOGRAPHY CHEST  CT ABDOMEN AND PELVIS WITH CONTRAST  TECHNIQUE: Multidetector CT imaging of the chest was performed using the standard protocol during bolus administration of intravenous contrast. Multiplanar CT image reconstructions and MIPs were obtained to evaluate the vascular anatomy. Multidetector CT imaging of the abdomen and pelvis was performed using the standard protocol during bolus administration of intravenous contrast.  CONTRAST:  OMNIPAQUE IOHEXOL 350 MG/ML SOLN  COMPARISON:  01/27/2014.  FINDINGS: CTA CHEST FINDINGS  Bones: Normal.  Cardiovascular: Technically adequate study. No pulmonary embolus. Normal variant 4 vessel aortic arch. Heart appears normal. RIGHT upper extremity PICC is present terminating in the mid SVC.  Lungs: Normal.  No significant atelectasis.  No airspace disease.  Central airways: Patent.  Effusions: None.  Lymphadenopathy: None.  Esophagus: Normal.  CT  ABDOMEN and PELVIS FINDINGS  Musculoskeletal:  No aggressive osseous lesions.  Liver: Small low-density lesions are present in the liver compatible with cysts. These are unchanged compared to prior.  Spleen:  Normal.  Gallbladder:  Normal.  Common bile duct:  Normal.  Pancreas: The pancreas itself appears within normal limits. Just anterior to the pancreas, there is a fluid collection with some fat. On axial imaging, the  largest measurements are 8 cm x 5 cm craniocaudal extent is 17 cm however this over estimates the true size of the fluid collection because of the configuration. The inferior portion demonstrates peripheral enhancement. No gas is present.  Adrenal glands:  Normal bilaterally.  Kidneys: LEFT renal cysts. Normal enhancement and delayed excretion of contrast. Both ureters appear within normal limits.  Stomach:  Partially collapsed.  Small bowel: Duodenum appears normal. Progressive dilation of small bowel extending into the anatomic pelvis. In the postoperative period, this is most consistent with ileus. No defined transition point is identified to suggest obstruction.  Colon: Subtotal colectomy. Small portion of the rectosigmoid remains present. Surgical drain is present from a LEFT-sided approach extending into the anatomic pelvis. No pelvic fluid collection/abscess.  Pelvic Genitourinary: Collapsed urinary bladder. Stranding and soft tissue density anterior to the bladder dome probably represents postoperative hematoma.  Vasculature: Circumaortic LEFT renal vein.  Body Wall: Uncomplicated midline incisions site with skin staples.  Review of the MIP images confirms the above findings.  IMPRESSION: 1. Negative for pulmonary embolus or acute abnormality in the chest. 2. Fluid collection anterior to the body and tail of the pancreas. Differential considerations for the fluid collection are seroma, abscess, or pseudocyst in this patient with abdominal surgery. 3. Ileus. 4. Subtotal colectomy with surgical drain in the anatomic pelvis.   Electronically Signed   By: Andreas Newport M.D.   On: 07/11/2014 11:51   Ct Abdomen Pelvis W Contrast  07/11/2014   CLINICAL DATA:  Short of breath. Elevated white blood cell count. Partial colectomy. Postoperative dyspnea and leukocytosis.  EXAM: CT ANGIOGRAPHY CHEST  CT ABDOMEN AND PELVIS WITH CONTRAST  TECHNIQUE: Multidetector CT imaging of the chest was performed using the  standard protocol during bolus administration of intravenous contrast. Multiplanar CT image reconstructions and MIPs were obtained to evaluate the vascular anatomy. Multidetector CT imaging of the abdomen and pelvis was performed using the standard protocol during bolus administration of intravenous contrast.  CONTRAST:  OMNIPAQUE IOHEXOL 350 MG/ML SOLN  COMPARISON:  01/27/2014.  FINDINGS: CTA CHEST FINDINGS  Bones: Normal.  Cardiovascular: Technically adequate study. No pulmonary embolus. Normal variant 4 vessel aortic arch. Heart appears normal. RIGHT upper extremity PICC is present terminating in the mid SVC.  Lungs: Normal.  No significant atelectasis.  No airspace disease.  Central airways: Patent.  Effusions: None.  Lymphadenopathy: None.  Esophagus: Normal.  CT ABDOMEN and PELVIS FINDINGS  Musculoskeletal:  No aggressive osseous lesions.  Liver: Small low-density lesions are present in the liver compatible with cysts. These are unchanged compared to prior.  Spleen:  Normal.  Gallbladder:  Normal.  Common bile duct:  Normal.  Pancreas: The pancreas itself appears within normal limits. Just anterior to the pancreas, there is a fluid collection with some fat. On axial imaging, the largest measurements are 8 cm x 5 cm craniocaudal extent is 17 cm however this over estimates the true size of the fluid collection because of the configuration. The inferior portion demonstrates peripheral enhancement. No gas is present.  Adrenal glands:  Normal bilaterally.  Kidneys: LEFT renal cysts. Normal enhancement and delayed excretion of contrast. Both ureters appear within normal limits.  Stomach:  Partially collapsed.  Small bowel: Duodenum appears normal. Progressive dilation of small bowel extending into the anatomic pelvis. In the postoperative period, this is most consistent with ileus. No defined transition point is identified to suggest obstruction.  Colon: Subtotal colectomy. Small portion of the rectosigmoid  remains present. Surgical drain is present from a LEFT-sided approach extending into the anatomic pelvis. No pelvic fluid collection/abscess.  Pelvic Genitourinary: Collapsed urinary bladder. Stranding and soft tissue density anterior to the bladder dome probably represents postoperative hematoma.  Vasculature: Circumaortic LEFT renal vein.  Body Wall: Uncomplicated midline incisions site with skin staples.  Review of the MIP images confirms the above findings.  IMPRESSION: 1. Negative for pulmonary embolus or acute abnormality in the chest. 2. Fluid collection anterior to the body and tail of the pancreas. Differential considerations for the fluid collection are seroma, abscess, or pseudocyst in this patient with abdominal surgery. 3. Ileus. 4. Subtotal colectomy with surgical drain in the anatomic pelvis.   Electronically Signed   By: Andreas Newport M.D.   On: 07/11/2014 11:51   Ct Image Guided Drainage By Percutaneous Catheter  07/12/2014   CLINICAL DATA:  Recent subtotal colectomy for recurrent diverticulitis . Elevated postop white blood cell count. CT demonstrates elongated left retroperitoneal fluid collection anterior to the pancreatic body and extending inferiorly to the level of the IMA origin  EXAM: CT GUIDED DRAINAGE OF RETROPERITONEAL ABSCESS  ANESTHESIA/SEDATION: Intravenous Fentanyl and Versed were administered as conscious sedation during continuous cardiorespiratory monitoring by the radiology RN, with a total moderate sedation time of 22 minutes.  PROCEDURE: The procedure, risks, benefits, and alternatives were explained to the patient. Questions regarding the procedure were encouraged and answered. The patient understands and consents to the procedure.  Patient placed in right lateral decubitus position. Select axial scans through the upper abdomen were obtained and appropriate skin age site was determined. Skin was marked.  The operative field was prepped with Betadinein a sterile fashion,  and a sterile drape was applied covering the operative field. A sterile gown and sterile gloves were used for the procedure. Local anesthesia was provided with 1% Lidocaine.  Under CT fluoroscopic guidance, an 18 gauge trocar needle was advanced to the margin of the collection. Opaque tan fluid could be aspirated. An Amplatz wire advanced easily within the collection, position confirmed on CT fluoroscopy. Track dilated to facilitate placement of a 12 French pigtail catheter, formed within the central aspect of the collection. 10 mL sample aspirated, sent for Gram stain, culture, and amylase. Catheter secured externally with 0 Prolene suture and placed to gravity bag. The patient tolerated the procedure well.  COMPLICATIONS: None  FINDINGS: Left retroperitoneal collection was again localized. An appropriate approach was determined, and a 12 French pigtail catheter placed under CT fluoroscopy. Samples sent for Gram stain, culture, and amylase.  IMPRESSION: 1. Technically successful CT-guided retroperitoneal abscess drain catheter placement.   Electronically Signed   By: Oley Balm M.D.   On: 07/12/2014 09:47    Labs:  CBC:  Recent Labs  07/11/14 0425 07/12/14 0005 07/12/14 0508 07/13/14 0430  WBC 27.6* 25.2* 25.3* 21.2*  HGB 9.8* 9.8* 9.4* 9.7*  HCT 29.8* 28.7* 28.7* 29.0*  PLT 556* 620* 602* 695*    COAGS:  Recent Labs  06/25/14 1445 07/12/14 0508  INR 1.08 1.19  APTT  --  34    BMP:  Recent Labs  07/10/14 0535 07/11/14 0425 07/12/14 0508 07/13/14 0430  NA 139 133* 134* 135*  K 3.4* 3.6* 4.1 4.4  CL 104 100 101 102  CO2 20 21 21 20   GLUCOSE 154* 132* 137* 144*  BUN 15 15 17 16   CALCIUM 8.3* 8.4 8.6 8.6  CREATININE 1.23 1.20 1.27 1.21  GFRNONAA 69* 71* 66* 70*  GFRAA 80* 82* 77* 81*    LIVER FUNCTION TESTS:  Recent Labs  06/18/14 0910 06/25/14 1445 07/09/14 0517 07/10/14 0535  BILITOT 0.5 0.7 0.4 0.5  AST 24 25 43* 32  ALT 26 33 71* 65*  ALKPHOS 66 69 65  73  PROT 7.4 7.6 5.7* 6.3  ALBUMIN 4.0 4.2 2.2* 2.4*    Assessment and Plan: Pt with history of recurrent sigmoid diverticulitis, s/p subtotal colectomy with partial omentectomy/incidental appendectomy 07/01/13; now with left RP fluid collection, s/p drainage 10/3; fluid amylase 276 c/w panc origin (?pseudocyst); check final cx's, monitor labs; cont drain irrigation; other plans as per CCS. c diff neg      I spent a total of 15 minutes face to face in clinical consultation/evaluation, greater than 50% of which was counseling/coordinating care for left abdominal drain.  Signed: Chinita PesterALLRED,D KEVIN 07/13/2014, 10:03 AM

## 2014-07-14 LAB — COMPREHENSIVE METABOLIC PANEL
ALT: 37 U/L (ref 0–53)
AST: 20 U/L (ref 0–37)
Albumin: 2.5 g/dL — ABNORMAL LOW (ref 3.5–5.2)
Alkaline Phosphatase: 86 U/L (ref 39–117)
Anion gap: 14 (ref 5–15)
BUN: 17 mg/dL (ref 6–23)
CO2: 21 mEq/L (ref 19–32)
Calcium: 8.8 mg/dL (ref 8.4–10.5)
Chloride: 99 mEq/L (ref 96–112)
Creatinine, Ser: 1.22 mg/dL (ref 0.50–1.35)
GFR calc Af Amer: 81 mL/min — ABNORMAL LOW (ref >90)
GFR calc non Af Amer: 70 mL/min — ABNORMAL LOW (ref >90)
Glucose, Bld: 138 mg/dL — ABNORMAL HIGH (ref 70–99)
Potassium: 4.5 mEq/L (ref 3.7–5.3)
Sodium: 134 mEq/L — ABNORMAL LOW (ref 137–147)
Total Bilirubin: 0.4 mg/dL (ref 0.3–1.2)
Total Protein: 6.9 g/dL (ref 6.0–8.3)

## 2014-07-14 LAB — CULTURE, ROUTINE-ABSCESS: Special Requests: NORMAL

## 2014-07-14 LAB — DIFFERENTIAL
Basophils Absolute: 0.1 10*3/uL (ref 0.0–0.1)
Basophils Relative: 0 % (ref 0–1)
Eosinophils Absolute: 0.4 10*3/uL (ref 0.0–0.7)
Eosinophils Relative: 2 % (ref 0–5)
Lymphocytes Relative: 8 % — ABNORMAL LOW (ref 12–46)
Lymphs Abs: 1.8 10*3/uL (ref 0.7–4.0)
Monocytes Absolute: 2.6 10*3/uL — ABNORMAL HIGH (ref 0.1–1.0)
Monocytes Relative: 12 % (ref 3–12)
Neutro Abs: 16.8 10*3/uL — ABNORMAL HIGH (ref 1.7–7.7)
Neutrophils Relative %: 78 % — ABNORMAL HIGH (ref 43–77)

## 2014-07-14 LAB — MAGNESIUM: MAGNESIUM: 2.2 mg/dL (ref 1.5–2.5)

## 2014-07-14 LAB — CBC
HCT: 29.5 % — ABNORMAL LOW (ref 39.0–52.0)
Hemoglobin: 9.7 g/dL — ABNORMAL LOW (ref 13.0–17.0)
MCH: 29.8 pg (ref 26.0–34.0)
MCHC: 32.9 g/dL (ref 30.0–36.0)
MCV: 90.8 fL (ref 78.0–100.0)
Platelets: 695 10*3/uL — ABNORMAL HIGH (ref 150–400)
RBC: 3.25 MIL/uL — ABNORMAL LOW (ref 4.22–5.81)
RDW: 13.7 % (ref 11.5–15.5)
WBC: 21.7 10*3/uL — ABNORMAL HIGH (ref 4.0–10.5)

## 2014-07-14 LAB — GLUCOSE, CAPILLARY
GLUCOSE-CAPILLARY: 126 mg/dL — AB (ref 70–99)
Glucose-Capillary: 125 mg/dL — ABNORMAL HIGH (ref 70–99)
Glucose-Capillary: 140 mg/dL — ABNORMAL HIGH (ref 70–99)
Glucose-Capillary: 143 mg/dL — ABNORMAL HIGH (ref 70–99)
Glucose-Capillary: 143 mg/dL — ABNORMAL HIGH (ref 70–99)

## 2014-07-14 LAB — TRIGLYCERIDES: TRIGLYCERIDES: 56 mg/dL (ref <150)

## 2014-07-14 LAB — PREALBUMIN: PREALBUMIN: 14.4 mg/dL — AB (ref 17.0–34.0)

## 2014-07-14 LAB — PHOSPHORUS: Phosphorus: 3.8 mg/dL (ref 2.3–4.6)

## 2014-07-14 MED ORDER — TRACE MINERALS CR-CU-F-FE-I-MN-MO-SE-ZN IV SOLN
INTRAVENOUS | Status: AC
  Administered 2014-07-14: 17:00:00 via INTRAVENOUS
  Filled 2014-07-14: qty 2000

## 2014-07-14 MED ORDER — PIPERACILLIN-TAZOBACTAM 3.375 G IVPB
3.3750 g | Freq: Three times a day (TID) | INTRAVENOUS | Status: DC
  Administered 2014-07-14: 3.375 g via INTRAVENOUS
  Filled 2014-07-14 (×2): qty 50

## 2014-07-14 MED ORDER — ENOXAPARIN SODIUM 40 MG/0.4ML ~~LOC~~ SOLN
40.0000 mg | SUBCUTANEOUS | Status: DC
  Administered 2014-07-14 – 2014-07-19 (×6): 40 mg via SUBCUTANEOUS
  Filled 2014-07-14 (×6): qty 0.4

## 2014-07-14 MED ORDER — SODIUM CHLORIDE 0.9 % IV SOLN
1.0000 g | INTRAVENOUS | Status: DC
  Administered 2014-07-14 – 2014-07-17 (×4): 1 g via INTRAVENOUS
  Filled 2014-07-14 (×4): qty 1

## 2014-07-14 MED ORDER — FAT EMULSION 20 % IV EMUL
250.0000 mL | INTRAVENOUS | Status: AC
  Administered 2014-07-14: 250 mL via INTRAVENOUS
  Filled 2014-07-14: qty 250

## 2014-07-14 NOTE — Progress Notes (Signed)
PARENTERAL NUTRITION CONSULT NOTE  Pharmacy Consult for TNA Indication: Prolonged postoperative ileus  Allergies  Allergen Reactions  . Celebrex [Celecoxib]     Mother is allergic, so I do not take it  . Sulfa Antibiotics Nausea And Vomiting  . Codeine Rash  . Dilaudid [Hydromorphone Hcl] Nausea And Vomiting and Anxiety   Patient Measurements: Height: 6' (182.9 cm) Weight: 227 lb 15.3 oz (103.4 kg) IBW/kg (Calculated) : 77.6  Vital Signs: Temp: 99 F (37.2 C) (10/05 0540) Temp Source: Oral (10/05 0540) BP: 118/64 mmHg (10/05 0540) Pulse Rate: 88 (10/05 0540) Intake/Output from previous day: 10/04 0701 - 10/05 0700 In: 2661 [P.O.:480; TPN:2166] Out: 635 [Urine:600; Drains:35] Intake/Output from this shift:   Labs:  Recent Labs  07/12/14 0508 07/13/14 0430 07/14/14 0500  WBC 25.3* 21.2* 21.7*  HGB 9.4* 9.7* 9.7*  HCT 28.7* 29.0* 29.5*  PLT 602* 695* 695*  APTT 34  --   --   INR 1.19  --   --     Recent Labs  07/12/14 0508 07/13/14 0430 07/14/14 0500  NA 134* 135* 134*  K 4.1 4.4 4.5  CL 101 102 99  CO2 21 20 21   GLUCOSE 137* 144* 138*  BUN 17 16 17   CREATININE 1.27 1.21 1.22  CALCIUM 8.6 8.6 8.8  MG  --   --  2.2  PHOS  --   --  3.8  PROT  --   --  6.9  ALBUMIN  --   --  2.5*  AST  --   --  20  ALT  --   --  37  ALKPHOS  --   --  86  BILITOT  --   --  0.4  TRIG  --   --  56  corrected Ca 9.7 Estimated Creatinine Clearance: 94.1 ml/min (by C-G formula based on Cr of 1.22).    Recent Labs  07/13/14 1749 07/14/14 0020 07/14/14 0558  GLUCAP 131* 143* 143*   Insulin Requirements in past 24 hrs: 4 units SSI (no hx of DM)  Current Nutrition:  CL diet Clinimix-E 5/15 at 80 mL/hr Fat Emulsion 20% at 10 mL/hr  IVF: None  Central access: PICC placed 9/29 TPN start date: 9/29  ASSESSMENT                                                                                                          HPI:  [id number] yoM with recurrent sigmoid  diverticulitis. S/p lap assisted subtotal colectomy on 9/22. Patient has developed postop ileus. Pharmacy consulted to assist in TPN therapy.   Significant events:  9/29: Had some crampy upper abdominal pain then nausea and vomiting-basically everything he ate yesterday. 10/2: Passing flatus, multiple loose BMs - C.diff pending.  Mild dyspnea when walking - for CT chest.  Leukocytosis persists - for repeat CT of abdomen. 10/3: 2nd drain placed in IR for abd fluid collection  10/4: 165 ml out drain placed 10/3, total 45 ml out drain placed 11 days ago  Today 10/5:   Glucose -  controlled, CBGs < 150  Electrolytes - Na low (cannot adjust in premixed TNA), other lytes WNL  Renal -SCr 1.22, stable  LFTs - WNL  TGs - WNL (9/30, 10/5)  Prealbumin - 13.9 (9/30), today's lab in process  Still some output from drain sites.   NUTRITIONAL GOALS                                                                                    RD recs: 1950-2250 Kcal, 120-140g Protein, 1.9-2.2 L/day fluid Clinimix E 5/15 at a goal rate of 170ml/hr + 20% fat emulsion at ml/hr to provide: 120 g/day protein, 2184 Kcal/day.  PLAN                                                                                                                  Continue Clinimix E 5/15 at 80 ml/hr, no rate advancement with CL diet (receiving Resource Breeze TID)  Continue 20% fat emulsion at 50ml/hr.  TNA to contain standard multivitamins and trace elements.  Continue sensitive scale SSI .   Continue to follow LFTs with routine TNA labs.  TNA lab panels on Mondays & Thursdays.  Clance Boll, PharmD, BCPS Pager: [phone] 07/14/2014 10:21 AM

## 2014-07-14 NOTE — Progress Notes (Signed)
Referring Physician(s): CCS  Subjective: Patient c/o pain at drain sites.   Allergies: Celebrex; Sulfa antibiotics; Codeine; and Dilaudid  Medications: Prior to Admission medications   Medication Sig Start Date End Date Taking? Authorizing Provider  ciprofloxacin (CIPRO) 500 MG tablet Take 1 tablet (500 mg total) by mouth 2 (two) times daily. 06/18/14  Yes Enid Skeens, MD  diphenhydrAMINE (BENADRYL) 25 MG tablet Take 25 mg by mouth every 6 (six) hours as needed for allergies.    Yes Historical Provider, MD  hyoscyamine (LEVSIN SL) 0.125 MG SL tablet Place 0.125 mg under the tongue every 6 (six) hours as needed for cramping.   Yes Historical Provider, MD  ondansetron (ZOFRAN-ODT) 4 MG disintegrating tablet Take 4 mg by mouth every 4 (four) hours as needed for nausea or vomiting.   Yes Historical Provider, MD  oxyCODONE-acetaminophen (PERCOCET/ROXICET) 5-325 MG per tablet Take 2 tablets by mouth every 4 (four) hours as needed for severe pain. 03/24/14  Yes Jillyn Ledger, PA-C  oxyCODONE-acetaminophen (PERCOCET/ROXICET) 5-325 MG per tablet Take 1 tablet by mouth every 4 (four) hours as needed for moderate pain or severe pain.   Yes Historical Provider, MD  zolpidem (AMBIEN) 10 MG tablet Take 10 mg by mouth at bedtime as needed for sleep.    Yes Historical Provider, MD  colchicine 0.6 MG tablet Take 0.6 mg by mouth 2 (two) times daily as needed (For gout.).  06/04/14   Historical Provider, MD  losartan-hydrochlorothiazide (HYZAAR) 50-12.5 MG per tablet Take 0.5 tablets by mouth at bedtime.  11/18/13   Historical Provider, MD  metroNIDAZOLE (FLAGYL) 500 MG tablet Take 1 tablet (500 mg total) by mouth 3 (three) times daily. 06/18/14   Enid Skeens, MD  Probiotic Product (PROBIOTIC DAILY PO) Take 1 capsule by mouth at bedtime.    Historical Provider, MD    Review of Systems  Vital Signs: BP 118/64  Pulse 88  Temp(Src) 99 F (37.2 C) (Oral)  Resp 18  Ht 6' (1.829 m)  Wt 227 lb 15.3 oz  (103.4 kg)  BMI 30.91 kg/m2  SpO2 96%  Physical Exam General: A&Ox3, NAD Abd: Soft, left sided tenderness, LUQ IR perc drain 24 hr output tan 15 cc, LLQ 66F surgical drain intact 24 hr output 20 cc clear amber fluid  Imaging: Ct Angio Chest Pe W/cm &/or Wo Cm  07/11/2014   CLINICAL DATA:  Short of breath. Elevated white blood cell count. Partial colectomy. Postoperative dyspnea and leukocytosis.  EXAM: CT ANGIOGRAPHY CHEST  CT ABDOMEN AND PELVIS WITH CONTRAST  TECHNIQUE: Multidetector CT imaging of the chest was performed using the standard protocol during bolus administration of intravenous contrast. Multiplanar CT image reconstructions and MIPs were obtained to evaluate the vascular anatomy. Multidetector CT imaging of the abdomen and pelvis was performed using the standard protocol during bolus administration of intravenous contrast.  CONTRAST:  OMNIPAQUE IOHEXOL 350 MG/ML SOLN  COMPARISON:  01/27/2014.  FINDINGS: CTA CHEST FINDINGS  Bones: Normal.  Cardiovascular: Technically adequate study. No pulmonary embolus. Normal variant 4 vessel aortic arch. Heart appears normal. RIGHT upper extremity PICC is present terminating in the mid SVC.  Lungs: Normal.  No significant atelectasis.  No airspace disease.  Central airways: Patent.  Effusions: None.  Lymphadenopathy: None.  Esophagus: Normal.  CT ABDOMEN and PELVIS FINDINGS  Musculoskeletal:  No aggressive osseous lesions.  Liver: Small low-density lesions are present in the liver compatible with cysts. These are unchanged compared to prior.  Spleen:  Normal.  Gallbladder:  Normal.  Common bile duct:  Normal.  Pancreas: The pancreas itself appears within normal limits. Just anterior to the pancreas, there is a fluid collection with some fat. On axial imaging, the largest measurements are 8 cm x 5 cm craniocaudal extent is 17 cm however this over estimates the true size of the fluid collection because of the configuration. The inferior portion  demonstrates peripheral enhancement. No gas is present.  Adrenal glands:  Normal bilaterally.  Kidneys: LEFT renal cysts. Normal enhancement and delayed excretion of contrast. Both ureters appear within normal limits.  Stomach:  Partially collapsed.  Small bowel: Duodenum appears normal. Progressive dilation of small bowel extending into the anatomic pelvis. In the postoperative period, this is most consistent with ileus. No defined transition point is identified to suggest obstruction.  Colon: Subtotal colectomy. Small portion of the rectosigmoid remains present. Surgical drain is present from a LEFT-sided approach extending into the anatomic pelvis. No pelvic fluid collection/abscess.  Pelvic Genitourinary: Collapsed urinary bladder. Stranding and soft tissue density anterior to the bladder dome probably represents postoperative hematoma.  Vasculature: Circumaortic LEFT renal vein.  Body Wall: Uncomplicated midline incisions site with skin staples.  Review of the MIP images confirms the above findings.  IMPRESSION: 1. Negative for pulmonary embolus or acute abnormality in the chest. 2. Fluid collection anterior to the body and tail of the pancreas. Differential considerations for the fluid collection are seroma, abscess, or pseudocyst in this patient with abdominal surgery. 3. Ileus. 4. Subtotal colectomy with surgical drain in the anatomic pelvis.   Electronically Signed   By: Andreas Newport M.D.   On: 07/11/2014 11:51   Ct Abdomen Pelvis W Contrast  07/11/2014   CLINICAL DATA:  Short of breath. Elevated white blood cell count. Partial colectomy. Postoperative dyspnea and leukocytosis.  EXAM: CT ANGIOGRAPHY CHEST  CT ABDOMEN AND PELVIS WITH CONTRAST  TECHNIQUE: Multidetector CT imaging of the chest was performed using the standard protocol during bolus administration of intravenous contrast. Multiplanar CT image reconstructions and MIPs were obtained to evaluate the vascular anatomy. Multidetector CT imaging  of the abdomen and pelvis was performed using the standard protocol during bolus administration of intravenous contrast.  CONTRAST:  OMNIPAQUE IOHEXOL 350 MG/ML SOLN  COMPARISON:  01/27/2014.  FINDINGS: CTA CHEST FINDINGS  Bones: Normal.  Cardiovascular: Technically adequate study. No pulmonary embolus. Normal variant 4 vessel aortic arch. Heart appears normal. RIGHT upper extremity PICC is present terminating in the mid SVC.  Lungs: Normal.  No significant atelectasis.  No airspace disease.  Central airways: Patent.  Effusions: None.  Lymphadenopathy: None.  Esophagus: Normal.  CT ABDOMEN and PELVIS FINDINGS  Musculoskeletal:  No aggressive osseous lesions.  Liver: Small low-density lesions are present in the liver compatible with cysts. These are unchanged compared to prior.  Spleen:  Normal.  Gallbladder:  Normal.  Common bile duct:  Normal.  Pancreas: The pancreas itself appears within normal limits. Just anterior to the pancreas, there is a fluid collection with some fat. On axial imaging, the largest measurements are 8 cm x 5 cm craniocaudal extent is 17 cm however this over estimates the true size of the fluid collection because of the configuration. The inferior portion demonstrates peripheral enhancement. No gas is present.  Adrenal glands:  Normal bilaterally.  Kidneys: LEFT renal cysts. Normal enhancement and delayed excretion of contrast. Both ureters appear within normal limits.  Stomach:  Partially collapsed.  Small bowel: Duodenum appears normal. Progressive dilation of small  bowel extending into the anatomic pelvis. In the postoperative period, this is most consistent with ileus. No defined transition point is identified to suggest obstruction.  Colon: Subtotal colectomy. Small portion of the rectosigmoid remains present. Surgical drain is present from a LEFT-sided approach extending into the anatomic pelvis. No pelvic fluid collection/abscess.  Pelvic Genitourinary: Collapsed urinary bladder.  Stranding and soft tissue density anterior to the bladder dome probably represents postoperative hematoma.  Vasculature: Circumaortic LEFT renal vein.  Body Wall: Uncomplicated midline incisions site with skin staples.  Review of the MIP images confirms the above findings.  IMPRESSION: 1. Negative for pulmonary embolus or acute abnormality in the chest. 2. Fluid collection anterior to the body and tail of the pancreas. Differential considerations for the fluid collection are seroma, abscess, or pseudocyst in this patient with abdominal surgery. 3. Ileus. 4. Subtotal colectomy with surgical drain in the anatomic pelvis.   Electronically Signed   By: Andreas Newport M.D.   On: 07/11/2014 11:51   Ct Image Guided Drainage By Percutaneous Catheter  07/12/2014   CLINICAL DATA:  Recent subtotal colectomy for recurrent diverticulitis . Elevated postop white blood cell count. CT demonstrates elongated left retroperitoneal fluid collection anterior to the pancreatic body and extending inferiorly to the level of the IMA origin  EXAM: CT GUIDED DRAINAGE OF RETROPERITONEAL ABSCESS  ANESTHESIA/SEDATION: Intravenous Fentanyl and Versed were administered as conscious sedation during continuous cardiorespiratory monitoring by the radiology RN, with a total moderate sedation time of 22 minutes.  PROCEDURE: The procedure, risks, benefits, and alternatives were explained to the patient. Questions regarding the procedure were encouraged and answered. The patient understands and consents to the procedure.  Patient placed in right lateral decubitus position. Select axial scans through the upper abdomen were obtained and appropriate skin age site was determined. Skin was marked.  The operative field was prepped with Betadinein a sterile fashion, and a sterile drape was applied covering the operative field. A sterile gown and sterile gloves were used for the procedure. Local anesthesia was provided with 1% Lidocaine.  Under CT  fluoroscopic guidance, an 18 gauge trocar needle was advanced to the margin of the collection. Opaque tan fluid could be aspirated. An Amplatz wire advanced easily within the collection, position confirmed on CT fluoroscopy. Track dilated to facilitate placement of a 12 French pigtail catheter, formed within the central aspect of the collection. 10 mL sample aspirated, sent for Gram stain, culture, and amylase. Catheter secured externally with 0 Prolene suture and placed to gravity bag. The patient tolerated the procedure well.  COMPLICATIONS: None  FINDINGS: Left retroperitoneal collection was again localized. An appropriate approach was determined, and a 12 French pigtail catheter placed under CT fluoroscopy. Samples sent for Gram stain, culture, and amylase.  IMPRESSION: 1. Technically successful CT-guided retroperitoneal abscess drain catheter placement.   Electronically Signed   By: Oley Balm M.D.   On: 07/12/2014 09:47    Labs:  CBC:  Recent Labs  07/12/14 0005 07/12/14 0508 07/13/14 0430 07/14/14 0500  WBC 25.2* 25.3* 21.2* 21.7*  HGB 9.8* 9.4* 9.7* 9.7*  HCT 28.7* 28.7* 29.0* 29.5*  PLT 620* 602* 695* 695*    COAGS:  Recent Labs  06/25/14 1445 07/12/14 0508  INR 1.08 1.19  APTT  --  34    BMP:  Recent Labs  07/11/14 0425 07/12/14 0508 07/13/14 0430 07/14/14 0500  NA 133* 134* 135* 134*  K 3.6* 4.1 4.4 4.5  CL 100 101 102 99  CO2 21  21 20 21   GLUCOSE 132* 137* 144* 138*  BUN 15 17 16 17   CALCIUM 8.4 8.6 8.6 8.8  CREATININE 1.20 1.27 1.21 1.22  GFRNONAA 71* 66* 70* 70*  GFRAA 82* 77* 81* 81*    LIVER FUNCTION TESTS:  Recent Labs  06/25/14 1445 07/09/14 0517 07/10/14 0535 07/14/14 0500  BILITOT 0.7 0.4 0.5 0.4  AST 25 43* 32 20  ALT 33 71* 65* 37  ALKPHOS 69 65 73 86  PROT 7.6 5.7* 6.3 6.9  ALBUMIN 4.2 2.2* 2.4* 2.5*    Assessment and Plan: Recurrent sigmoid diverticulitis s/p subtotal colectomy with partial omentectomy/incidental  appendectomy 07/01/13 Left RP fluid collection s/p perc drain placed 10/3. Cx abundant gram (-) rods/pending, amylase 276, await final cultures, wbc with no significant improvement.  Continue drain flushes and monitor daily output Plans per CCS    I spent a total of 15 minutes face to face in clinical consultation/evaluation, greater than 50% of which was counseling/coordinating care   Signed: Berneta LevinsMORGAN, Noretta Frier D 07/14/2014, 11:15 AM

## 2014-07-14 NOTE — Progress Notes (Signed)
13 Days Post-Op  Subjective: Sore around perc drain site.  Tolerating clear liquids.  Bowels moving.  Objective: Vital signs in last 24 hours: Temp:  [99 F (37.2 C)-99.7 F (37.6 C)] 99 F (37.2 C) (10/05 0540) Pulse Rate:  [88-91] 88 (10/05 0540) Resp:  [18] 18 (10/05 0540) BP: (118-128)/(64-71) 118/64 mmHg (10/05 0540) SpO2:  [95 %-96 %] 96 % (10/05 0540) Last BM Date: 07/13/14  Intake/Output from previous day: 10/04 0701 - 10/05 0700 In: 2661 [P.O.:480; TPN:2166] Out: 635 [Urine:600; Drains:35] Intake/Output this shift: Total I/O In: 720 [P.O.:720] Out: 20 [Drains:20]  PE: General- In NAD Abdomen-soft, incisions are clean and intact, serous LLQ drain output, purulent left flank drain output, active bowel sounds Lab Results:   Recent Labs  07/13/14 0430 07/14/14 0500  WBC 21.2* 21.7*  HGB 9.7* 9.7*  HCT 29.0* 29.5*  PLT 695* 695*   BMET  Recent Labs  07/13/14 0430 07/14/14 0500  NA 135* 134*  K 4.4 4.5  CL 102 99  CO2 20 21  GLUCOSE 144* 138*  BUN 16 17  CREATININE 1.21 1.22  CALCIUM 8.6 8.8   PT/INR  Recent Labs  07/12/14 0508  LABPROT 15.3*  INR 1.19   Comprehensive Metabolic Panel:    Component Value Date/Time   NA 134* 07/14/2014 0500   NA 135* 07/13/2014 0430   K 4.5 07/14/2014 0500   K 4.4 07/13/2014 0430   CL 99 07/14/2014 0500   CL 102 07/13/2014 0430   CO2 21 07/14/2014 0500   CO2 20 07/13/2014 0430   BUN 17 07/14/2014 0500   BUN 16 07/13/2014 0430   CREATININE 1.22 07/14/2014 0500   CREATININE 1.21 07/13/2014 0430   GLUCOSE 138* 07/14/2014 0500   GLUCOSE 144* 07/13/2014 0430   CALCIUM 8.8 07/14/2014 0500   CALCIUM 8.6 07/13/2014 0430   AST 20 07/14/2014 0500   AST 32 07/10/2014 0535   ALT 37 07/14/2014 0500   ALT 65* 07/10/2014 0535   ALKPHOS 86 07/14/2014 0500   ALKPHOS 73 07/10/2014 0535   BILITOT 0.4 07/14/2014 0500   BILITOT 0.5 07/10/2014 0535   PROT 6.9 07/14/2014 0500   PROT 6.3 07/10/2014 0535   ALBUMIN 2.5* 07/14/2014 0500   ALBUMIN 2.4* 07/10/2014 0535     Studies/Results: No results found.  Anti-infectives: Anti-infectives   Start     Dose/Rate Route Frequency Ordered Stop   07/14/14 0800  piperacillin-tazobactam (ZOSYN) IVPB 3.375 g     3.375 g 12.5 mL/hr over 240 Minutes Intravenous Every 8 hours 07/14/14 0723     07/08/14 2100  vancomycin (VANCOCIN) IVPB 1000 mg/200 mL premix  Status:  Discontinued     1,000 mg 200 mL/hr over 60 Minutes Intravenous Every 12 hours 07/08/14 1104 07/10/14 0832   07/08/14 0800  vancomycin (VANCOCIN) 2,000 mg in sodium chloride 0.9 % 500 mL IVPB     2,000 mg 250 mL/hr over 120 Minutes Intravenous  Once 07/08/14 0757 07/08/14 1107   07/07/14 1000  piperacillin-tazobactam (ZOSYN) IVPB 3.375 g  Status:  Discontinued     3.375 g 12.5 mL/hr over 240 Minutes Intravenous Every 8 hours 07/07/14 0821 07/10/14 0832   07/01/14 2000  cefoTEtan (CEFOTAN) 2 g in dextrose 5 % 50 mL IVPB     2 g 100 mL/hr over 30 Minutes Intravenous Every 12 hours 07/01/14 1339 07/01/14 2015   07/01/14 0625  cefoTEtan in Dextrose 5% (CEFOTAN) IVPB 2 g  Status:  Discontinued     2  g Intravenous Every 12 hours 07/01/14 0625 07/01/14 1413      Assessment Principal Problem:  1. Sigmoid diverticulitis-recurrent s/p lap assisted subtotal colectomy: ileus improving; he looks well clinically.  2. Post op intraabdominal abscess-GNR; Zosyn started today.   3.  PC Malnutrition- on TPN.     LOS: 13 days   Plan: Await final culture and sensitivity results, advance to full liquids.   Future Yeldell J 07/14/2014

## 2014-07-14 NOTE — Progress Notes (Signed)
NUTRITION FOLLOW UP  Intervention:   - TPN per pharmacy - Diet advancement per MD - Continue Resource Breeze TID - RD to continue to monitor   Nutrition Dx:   Inadequate oral intake related to clear liquid diet as evidenced by diet order - ongoing    Goal:   1. TPN to meet >90% of estimated nutritional needs - not met but is likely meting >90% of estimated needs with current TPN rate and nutritional supplement intake 2. Advance diet as tolerated to soft diet - not met   Monitor:   Weights, labs, diet advancement, TPN  Assessment:   Pt with recurrent sigmoid diverticulitis, admitted for subtotal colectomy which was performed 9/22. Developed postop ileus. Had some crampy upper abdominal pain then nausea and vomiting-basically everything he ate yesterday per MD. Scheduled to start TPN today.   9/29: - Met with pt who reports recently his appetite has been down with pt consuming 50% of usual intake of 2-3 well balanced meals/day  - Denies any significant changes in weight, says it fluctuates between 10-12 pounds  - C/o "hard" burping, 7-8 times/day with his stomach feeling more bloated  - Thinks he's having some nausea from gas - notified RN  - Performed nutrition focused physical exam which was WNL   10/1: - Per MD, pt's ileus continues - No nausea today or yesterday - Met with pt who had not yet ordered clear liquid tray this morning, had been up walking - [place] he did well with clear liquid diet yesterday - Diet intake discussed with pharmacist  10/5: - CT of abdomen/pelvis 10/2 demonstrates the peripancreatic fluid collection which was drained 10/3  - Remains on clear liquid diet - Met with pt who reports he's been tolerating clear liquid diet taking sips of broth and eating most of the jellos - Said he's been drinking 50-75% of Resource Breezes - C/o little bit of nausea and soreness from drain placement   TPN: Clinimix E 5/15 @ 80 ml/hr and lipids @ 10 ml/hr. Provides  1843 kcal, and 96 grams protein per day. Meets 94% minimum estimated energy needs and 80% minimum estimated protein needs.  Triglycerides WNL CBGs < 150 mg/dL   Height: Ht Readings from Last 1 Encounters:  07/01/14 6' (1.829 m)    Weight Status:   Wt Readings from Last 1 Encounters:  07/01/14 227 lb 15.3 oz (103.4 kg)    Re-estimated needs:  Kcal: 8682-5749  Protein: 120-140g  Fluid: 1.9-2.2L/day   Skin: surgical incision on abdomen   Diet Order: Clear Liquid   Intake/Output Summary (Last 24 hours) at 07/14/14 1221 Last data filed at 07/14/14 1000  Gross per 24 hour  Intake   2301 ml  Output     55 ml  Net   2246 ml    Last BM: 10/4   Labs:   Recent Labs Lab 07/09/14 0517 07/10/14 0535  07/12/14 0508 07/13/14 0430 07/14/14 0500  NA 139 139  < > 134* 135* 134*  K 3.5* 3.4*  < > 4.1 4.4 4.5  CL 103 104  < > 101 102 99  CO2 24 20  < > 21 20 21   BUN 13 15  < > 17 16 17   CREATININE 1.17 1.23  < > 1.27 1.21 1.22  CALCIUM 8.2* 8.3*  < > 8.6 8.6 8.8  MG 2.4 2.5  --   --   --  2.2  PHOS 4.0 4.3  --   --   --  3.8  GLUCOSE 138* 154*  < > 137* 144* 138*  < > = values in this interval not displayed.  CBG (last 3)   Recent Labs  07/14/14 0020 07/14/14 0558 07/14/14 1152  GLUCAP 143* 143* 125*    Scheduled Meds: . feeding supplement (RESOURCE BREEZE)  1 Container Oral TID BM  . insulin aspart  0-9 Units Subcutaneous 4 times per day  . pantoprazole (PROTONIX) IV  40 mg Intravenous QHS  . piperacillin-tazobactam (ZOSYN)  IV  3.375 g Intravenous Q8H    Continuous Infusions: . Marland KitchenTPN (CLINIMIX-E) Adult 80 mL/hr at 07/13/14 1805   And  . fat emulsion 250 mL (07/13/14 1805)  . Marland KitchenTPN (CLINIMIX-E) Adult     And  . fat emulsion      Carlis Stable MS, RD, [place] [phone] Pager [phone] Weekend/After Hours Pager

## 2014-07-15 DIAGNOSIS — Z1612 Extended spectrum beta lactamase (ESBL) resistance: Secondary | ICD-10-CM

## 2014-07-15 DIAGNOSIS — K5732 Diverticulitis of large intestine without perforation or abscess without bleeding: Principal | ICD-10-CM

## 2014-07-15 DIAGNOSIS — K651 Peritoneal abscess: Secondary | ICD-10-CM

## 2014-07-15 LAB — CBC
HCT: 30.1 % — ABNORMAL LOW (ref 39.0–52.0)
Hemoglobin: 10 g/dL — ABNORMAL LOW (ref 13.0–17.0)
MCH: 30 pg (ref 26.0–34.0)
MCHC: 33.2 g/dL (ref 30.0–36.0)
MCV: 90.4 fL (ref 78.0–100.0)
PLATELETS: 764 10*3/uL — AB (ref 150–400)
RBC: 3.33 MIL/uL — ABNORMAL LOW (ref 4.22–5.81)
RDW: 13.5 % (ref 11.5–15.5)
WBC: 15.7 10*3/uL — ABNORMAL HIGH (ref 4.0–10.5)

## 2014-07-15 LAB — GLUCOSE, CAPILLARY
GLUCOSE-CAPILLARY: 117 mg/dL — AB (ref 70–99)
Glucose-Capillary: 138 mg/dL — ABNORMAL HIGH (ref 70–99)
Glucose-Capillary: 107 mg/dL — ABNORMAL HIGH (ref 70–99)

## 2014-07-15 MED ORDER — FAT EMULSION 20 % IV EMUL
250.0000 mL | INTRAVENOUS | Status: DC
  Administered 2014-07-15: 250 mL via INTRAVENOUS
  Filled 2014-07-15: qty 250

## 2014-07-15 MED ORDER — CLINIMIX E/DEXTROSE (5/15) 5 % IV SOLN
INTRAVENOUS | Status: DC
Start: 2014-07-15 — End: 2014-07-16
  Administered 2014-07-15: 18:00:00 via INTRAVENOUS
  Filled 2014-07-15: qty 1000

## 2014-07-15 MED ORDER — ADULT MULTIVITAMIN W/MINERALS CH
1.0000 | ORAL_TABLET | Freq: Every day | ORAL | Status: DC
  Administered 2014-07-15 – 2014-07-17 (×3): 1 via ORAL
  Filled 2014-07-15 (×5): qty 1

## 2014-07-15 NOTE — Progress Notes (Signed)
14 Days Post-Op  Subjective: Feels better.  Tolerated full liquids.    Objective: Vital signs in last 24 hours: Temp:  [98.2 F (36.8 C)-98.5 F (36.9 C)] 98.2 F (36.8 C) (10/06 1610) Pulse Rate:  [77-88] 77 (10/06 0608) Resp:  [18-20] 18 (10/06 9604) BP: (113-140)/(65-87) 113/65 mmHg (10/06 0608) SpO2:  [97 %-100 %] 97 % (10/06 5409) Last BM Date: 07/15/14  Intake/Output from previous day: 10/05 0701 - 10/06 0700 In: 960 [P.O.:960] Out: 40 [Drains:40] Intake/Output this shift:    PE: General- In NAD Abdomen-soft, incisions are clean and intact, small amouint of purulent left flank drain output Lab Results:   Recent Labs  07/14/14 0500 07/15/14 0558  WBC 21.7* 15.7*  HGB 9.7* 10.0*  HCT 29.5* 30.1*  PLT 695* 764*   BMET  Recent Labs  07/13/14 0430 07/14/14 0500  NA 135* 134*  K 4.4 4.5  CL 102 99  CO2 20 21  GLUCOSE 144* 138*  BUN 16 17  CREATININE 1.21 1.22  CALCIUM 8.6 8.8   PT/INR No results found for this basename: LABPROT, INR,  in the last 72 hours Comprehensive Metabolic Panel:    Component Value Date/Time   NA 134* 07/14/2014 0500   NA 135* 07/13/2014 0430   K 4.5 07/14/2014 0500   K 4.4 07/13/2014 0430   CL 99 07/14/2014 0500   CL 102 07/13/2014 0430   CO2 21 07/14/2014 0500   CO2 20 07/13/2014 0430   BUN 17 07/14/2014 0500   BUN 16 07/13/2014 0430   CREATININE 1.22 07/14/2014 0500   CREATININE 1.21 07/13/2014 0430   GLUCOSE 138* 07/14/2014 0500   GLUCOSE 144* 07/13/2014 0430   CALCIUM 8.8 07/14/2014 0500   CALCIUM 8.6 07/13/2014 0430   AST 20 07/14/2014 0500   AST 32 07/10/2014 0535   ALT 37 07/14/2014 0500   ALT 65* 07/10/2014 0535   ALKPHOS 86 07/14/2014 0500   ALKPHOS 73 07/10/2014 0535   BILITOT 0.4 07/14/2014 0500   BILITOT 0.5 07/10/2014 0535   PROT 6.9 07/14/2014 0500   PROT 6.3 07/10/2014 0535   ALBUMIN 2.5* 07/14/2014 0500   ALBUMIN 2.4* 07/10/2014 0535     Studies/Results: No results found.  Anti-infectives: Anti-infectives   Start      Dose/Rate Route Frequency Ordered Stop   07/14/14 1600  ertapenem (INVANZ) 1 g in sodium chloride 0.9 % 50 mL IVPB     1 g 100 mL/hr over 30 Minutes Intravenous Every 24 hours 07/14/14 1457     07/14/14 0800  piperacillin-tazobactam (ZOSYN) IVPB 3.375 g  Status:  Discontinued     3.375 g 12.5 mL/hr over 240 Minutes Intravenous Every 8 hours 07/14/14 0723 07/14/14 1456   07/08/14 2100  vancomycin (VANCOCIN) IVPB 1000 mg/200 mL premix  Status:  Discontinued     1,000 mg 200 mL/hr over 60 Minutes Intravenous Every 12 hours 07/08/14 1104 07/10/14 0832   07/08/14 0800  vancomycin (VANCOCIN) 2,000 mg in sodium chloride 0.9 % 500 mL IVPB     2,000 mg 250 mL/hr over 120 Minutes Intravenous  Once 07/08/14 0757 07/08/14 1107   07/07/14 1000  piperacillin-tazobactam (ZOSYN) IVPB 3.375 g  Status:  Discontinued     3.375 g 12.5 mL/hr over 240 Minutes Intravenous Every 8 hours 07/07/14 0821 07/10/14 0832   07/01/14 2000  cefoTEtan (CEFOTAN) 2 g in dextrose 5 % 50 mL IVPB     2 g 100 mL/hr over 30 Minutes Intravenous Every 12 hours 07/01/14  1339 07/01/14 2015   07/01/14 0625  cefoTEtan in Dextrose 5% (CEFOTAN) IVPB 2 g  Status:  Discontinued     2 g Intravenous Every 12 hours 07/01/14 0625 07/01/14 1413      Assessment Principal Problem:  1. Sigmoid diverticulitis-recurrent s/p lap assisted subtotal colectomy: ileus improving; he looks well clinically.  2. Post op E. Coli intraabdominal abscess- organism sensitive only to Gentamicin and Primaxin; InVanz started and WBC down.   3.  PC Malnutrition- on TPN.     LOS: 14 days   Plan: Infectious disease consult re duration of therapy, ask IR to hook drain to bulb suction, advance to soft diet and stop TPN if diet tolerated.   Yui Mulvaney J 07/15/2014

## 2014-07-15 NOTE — Progress Notes (Signed)
PARENTERAL NUTRITION CONSULT NOTE  Pharmacy Consult for TNA Indication: Prolonged postoperative ileus  Allergies  Allergen Reactions  . Celebrex [Celecoxib]     Mother is allergic, so I do not take it  . Sulfa Antibiotics Nausea And Vomiting  . Codeine Rash  . Dilaudid [Hydromorphone Hcl] Nausea And Vomiting and Anxiety   Patient Measurements: Height: 6' (182.9 cm) Weight: 227 lb 15.3 oz (103.4 kg) IBW/kg (Calculated) : 77.6  Vital Signs: Temp: 98.2 F (36.8 C) (10/06 0608) Temp Source: Oral (10/06 16100608) BP: 113/65 mmHg (10/06 96040608) Pulse Rate: 77 (10/06 0608) Intake/Output from previous day: 10/05 0701 - 10/06 0700 In: 960 [P.O.:960] Out: 40 [Drains:40] Intake/Output from this shift:   Labs:  Recent Labs  07/13/14 0430 07/14/14 0500 07/15/14 0558  WBC 21.2* 21.7* 15.7*  HGB 9.7* 9.7* 10.0*  HCT 29.0* 29.5* 30.1*  PLT 695* 695* 764*    Recent Labs  07/13/14 0430 07/14/14 0500  NA 135* 134*  K 4.4 4.5  CL 102 99  CO2 20 21  GLUCOSE 144* 138*  BUN 16 17  CREATININE 1.21 1.22  CALCIUM 8.6 8.8  MG  --  2.2  PHOS  --  3.8  PROT  --  6.9  ALBUMIN  --  2.5*  AST  --  20  ALT  --  37  ALKPHOS  --  86  BILITOT  --  0.4  PREALBUMIN  --  14.4*  TRIG  --  56  corrected Ca 9.7 Estimated Creatinine Clearance: 94.1 ml/min (by C-G formula based on Cr of 1.22).    Recent Labs  07/14/14 1751 07/14/14 2338 07/15/14 0601  GLUCAP 126* 140* 138*   Insulin Requirements in past 24 hrs: 4 units SSI (no hx of DM)  Current Nutrition:  Advanced to soft diet today Clinimix-E 5/15 at 80 mL/hr Fat Emulsion 20% at 10 mL/hr  IVF: None  Central access: PICC placed 9/29 TPN start date: 9/29  ASSESSMENT                                                                                                          HPI:  [id number] yoM with recurrent sigmoid diverticulitis. S/p lap assisted subtotal colectomy on 9/22. Patient has developed postop ileus. Pharmacy consulted to  assist in TPN therapy.   Significant events:  9/29: Had some crampy upper abdominal pain then nausea and vomiting-basically everything he ate yesterday. 10/2: Passing flatus, multiple loose BMs - C.diff pending.  Mild dyspnea when walking - for CT chest.  Leukocytosis persists - for repeat CT of abdomen. 10/3: 2nd drain placed in IR for abd fluid collection  10/4: 165 ml out drain placed 10/3, total 45 ml out drain placed 11 days ago 10/5: Invanz started for post op ESBL E.coli intraabdominal abscess. 10/6: tolerating FLD, advancing to soft diet and will begin TNA wean.    Today 10/6:  Glucose - controlled, CBGs < 150  Electrolytes - Na low (cannot adjust in premixed TNA), other lytes WNL  Renal -SCr 1.22, stable   LFTs -  WNL  TGs - WNL (9/30, 10/5)  Prealbumin - 13.9 (9/30),  14.4 (10/5)  NUTRITIONAL GOALS                                                                                    RD recs: 1950-2250 Kcal, 120-140g Protein, 1.9-2.2 L/day fluid Clinimix E 5/15 at a goal rate of 161ml/hr + 20% fat emulsion at ml/hr to provide: 120 g/day protein, 2184 Kcal/day.  PLAN                                                                                                                  Reduce Clinimix E 5/15 to 40 ml/hr.  Patient is tolerating PO diet and has been advanced to soft diet today so will begin wean of TNA.  Continue 20% fat emulsion at 54ml/hr.  Remove standard multivitamins and trace elements from TNA and start PO MVI daily.  Continue sensitive scale SSI .   TNA lab panels on Mondays & Thursdays.  Labs have been stable.  Clance Boll, PharmD, BCPS Pager: [phone] 07/15/2014 10:04 AM

## 2014-07-15 NOTE — Progress Notes (Signed)
Patient ID: [name] CampanileDonald S [name], male   DOB: Sep 25, 1966, 48 y.o.   MRN: [id number]   Referring Physician(s): CCS  Subjective: Pt feeling "so-so" today; still with intermittent abd discomfort/cramping, mild nausea; diminished appetite   Allergies: Celebrex; Sulfa antibiotics; Codeine; and Dilaudid  Medications: Prior to Admission medications   Medication Sig Start Date End Date Taking? Authorizing Provider  ciprofloxacin (CIPRO) 500 MG tablet Take 1 tablet (500 mg total) by mouth 2 (two) times daily. 06/18/14  Yes Enid SkeensJoshua M Zavitz, MD  diphenhydrAMINE (BENADRYL) 25 MG tablet Take 25 mg by mouth every 6 (six) hours as needed for allergies.    Yes Historical Provider, MD  hyoscyamine (LEVSIN SL) 0.125 MG SL tablet Place 0.125 mg under the tongue every 6 (six) hours as needed for cramping.   Yes Historical Provider, MD  ondansetron (ZOFRAN-ODT) 4 MG disintegrating tablet Take 4 mg by mouth every 4 (four) hours as needed for nausea or vomiting.   Yes Historical Provider, MD  oxyCODONE-acetaminophen (PERCOCET/ROXICET) 5-325 MG per tablet Take 2 tablets by mouth every 4 (four) hours as needed for severe pain. 03/24/14  Yes Jillyn LedgerJessica K Palmer, PA-C  oxyCODONE-acetaminophen (PERCOCET/ROXICET) 5-325 MG per tablet Take 1 tablet by mouth every 4 (four) hours as needed for moderate pain or severe pain.   Yes Historical Provider, MD  zolpidem (AMBIEN) 10 MG tablet Take 10 mg by mouth at bedtime as needed for sleep.    Yes Historical Provider, MD  colchicine 0.6 MG tablet Take 0.6 mg by mouth 2 (two) times daily as needed (For gout.).  06/04/14   Historical Provider, MD  losartan-hydrochlorothiazide (HYZAAR) 50-12.5 MG per tablet Take 0.5 tablets by mouth at bedtime.  11/18/13   Historical Provider, MD  metroNIDAZOLE (FLAGYL) 500 MG tablet Take 1 tablet (500 mg total) by mouth 3 (three) times daily. 06/18/14   Enid SkeensJoshua M Zavitz, MD  Probiotic Product (PROBIOTIC DAILY PO) Take 1 capsule by mouth at bedtime.    Historical  Provider, MD    Review of Systems see above  Vital Signs: BP 113/65  Pulse 77  Temp(Src) 98.2 F (36.8 C) (Oral)  Resp 18  Ht 6' (1.829 m)  Wt 227 lb 15.3 oz (103.4 kg)  BMI 30.91 kg/m2  SpO2 97%  Physical Exam pt awake/alert; left( IR) abd drain intact, mild erythema at insertion site, mild-mod tender; output 40 cc's cream colored fluid; cx's- E coli; drain flushed with 10 cc's sterile NS and attached to JP bulb  Imaging: Ct Image Guided Drainage By Percutaneous Catheter  07/12/2014   CLINICAL DATA:  Recent subtotal colectomy for recurrent diverticulitis . Elevated postop white blood cell count. CT demonstrates elongated left retroperitoneal fluid collection anterior to the pancreatic body and extending inferiorly to the level of the IMA origin  EXAM: CT GUIDED DRAINAGE OF RETROPERITONEAL ABSCESS  ANESTHESIA/SEDATION: Intravenous Fentanyl and Versed were administered as conscious sedation during continuous cardiorespiratory monitoring by the radiology RN, with a total moderate sedation time of 22 minutes.  PROCEDURE: The procedure, risks, benefits, and alternatives were explained to the patient. Questions regarding the procedure were encouraged and answered. The patient understands and consents to the procedure.  Patient placed in right lateral decubitus position. Select axial scans through the upper abdomen were obtained and appropriate skin age site was determined. Skin was marked.  The operative field was prepped with Betadinein a sterile fashion, and a sterile drape was applied covering the operative field. A sterile gown and sterile gloves were used for the  procedure. Local anesthesia was provided with 1% Lidocaine.  Under CT fluoroscopic guidance, an 18 gauge trocar needle was advanced to the margin of the collection. Opaque tan fluid could be aspirated. An Amplatz wire advanced easily within the collection, position confirmed on CT fluoroscopy. Track dilated to facilitate placement of a  12 French pigtail catheter, formed within the central aspect of the collection. 10 mL sample aspirated, sent for Gram stain, culture, and amylase. Catheter secured externally with 0 Prolene suture and placed to gravity bag. The patient tolerated the procedure well.  COMPLICATIONS: None  FINDINGS: Left retroperitoneal collection was again localized. An appropriate approach was determined, and a 12 French pigtail catheter placed under CT fluoroscopy. Samples sent for Gram stain, culture, and amylase.  IMPRESSION: 1. Technically successful CT-guided retroperitoneal abscess drain catheter placement.   Electronically Signed   By: Oley Balm M.D.   On: 07/12/2014 09:47    Labs:  CBC:  Recent Labs  07/12/14 0508 07/13/14 0430 07/14/14 0500 07/15/14 0558  WBC 25.3* 21.2* 21.7* 15.7*  HGB 9.4* 9.7* 9.7* 10.0*  HCT 28.7* 29.0* 29.5* 30.1*  PLT 602* 695* 695* 764*    COAGS:  Recent Labs  06/25/14 1445 07/12/14 0508  INR 1.08 1.19  APTT  --  34    BMP:  Recent Labs  07/11/14 0425 07/12/14 0508 07/13/14 0430 07/14/14 0500  NA 133* 134* 135* 134*  K 3.6* 4.1 4.4 4.5  CL 100 101 102 99  CO2 21 21 20 21   GLUCOSE 132* 137* 144* 138*  BUN 15 17 16 17   CALCIUM 8.4 8.6 8.6 8.8  CREATININE 1.20 1.27 1.21 1.22  GFRNONAA 71* 66* 70* 70*  GFRAA 82* 77* 81* 81*    LIVER FUNCTION TESTS:  Recent Labs  06/25/14 1445 07/09/14 0517 07/10/14 0535 07/14/14 0500  BILITOT 0.7 0.4 0.5 0.4  AST 25 43* 32 20  ALT 33 71* 65* 37  ALKPHOS 69 65 73 86  PROT 7.6 5.7* 6.3 6.9  ALBUMIN 4.2 2.2* 2.4* 2.5*    Assessment and Plan: Pt with history of recurrent sigmoid diverticulitis, s/p subtotal colectomy with partial omentectomy/incidental appendectomy 07/01/13; now with left RP abscess, s/p drainage 10/3; drain attached to JP bulb suction today; cont diligent irrigation; antbx per ID; WBC 15.7(21.7); check f/u CT later this week       I spent a total of 15 minutes face to face in  clinical consultation/evaluation, greater than 50% of which was counseling/coordinating care for left abd abscess drainage.  Signed: Chinita Pester 07/15/2014, 2:51 PM

## 2014-07-15 NOTE — Consult Note (Signed)
[facility]    Date of Admission:  07/01/2014  Date of Consult:  07/15/2014  Reason for Consult:  complicated diverticulitis with ESBL abscess Referring Physician: Dr. Zella Richer   HPI: [name] [name] is an 48 y.o. male with recurrent sigmoid diverticulitis sp subtotal colectomy with partial omentectomy, incidental appendectomy, rigid proctosigmoidoscopy.  Who subsequently was found to have an intrabdominal abscess near the pancreas sp IR drain with purulent material --> cx + for ESBL now on Invanz.   Past Medical History  Diagnosis Date  . Hypertension   . Sarcoidosis   . Hyperlipemia   . Diverticulitis   . Allergy     SEASONAL  . Hypercholesteremia 06/15/2012  . 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 Vertebral artery stenting  2008  . Laparoscopic partial colectomy N/A 07/01/2014    Procedure: LAPAROSCOPIC ASSISTED  SUBTOTAL COLECTOMY INCIDENTAL APPENDECTOMY, PARTIAL OMENTECTOMY AND RIGID PROCTOSCOPY;  Surgeon: Jackolyn Confer, MD;  Location: [facility];  Service: General;  Laterality: N/A;  ergies:   Allergies  Allergen Reactions  . Celebrex [Celecoxib]     Mother is allergic, so I do not take it  . Sulfa Antibiotics Nausea And Vomiting  . Codeine Rash  . Dilaudid [Hydromorphone Hcl] Nausea And Vomiting and Anxiety     Medications: I have reviewed patients current medications as documented in Epic Anti-infectives   Start     Dose/Rate Route Frequency Ordered Stop   07/14/14 1600  ertapenem (INVANZ) 1 g in sodium chloride 0.9 % 50 mL IVPB     1 g 100 mL/hr over 30 Minutes Intravenous Every 24 hours 07/14/14 1457     07/14/14 0800  piperacillin-tazobactam (ZOSYN) IVPB 3.375 g  Status:  Discontinued     3.375 g 12.5 mL/hr over 240 Minutes Intravenous Every 8 hours 07/14/14 0723 07/14/14 1456   07/08/14 2100  vancomycin (VANCOCIN) IVPB 1000 mg/200 mL premix   Status:  Discontinued     1,000 mg 200 mL/hr over 60 Minutes Intravenous Every 12 hours 07/08/14 1104 07/10/14 0832   07/08/14 0800  vancomycin (VANCOCIN) 2,000 mg in sodium chloride 0.9 % 500 mL IVPB     2,000 mg 250 mL/hr over 120 Minutes Intravenous  Once 07/08/14 0757 07/08/14 1107   07/07/14 1000  piperacillin-tazobactam (ZOSYN) IVPB 3.375 g  Status:  Discontinued     3.375 g 12.5 mL/hr over 240 Minutes Intravenous Every 8 hours 07/07/14 0821 07/10/14 0832   07/01/14 2000  cefoTEtan (CEFOTAN) 2 g in dextrose 5 % 50 mL IVPB     2 g 100 mL/hr over 30 Minutes Intravenous Every 12 hours 07/01/14 1339 07/01/14 2015   07/01/14 0625  cefoTEtan in Dextrose 5% (CEFOTAN) IVPB 2 g  Status:  Discontinued     2 g Intravenous Every 12 hours 07/01/14 0625 07/01/14 1413      Social History:  reports that he has never smoked. He has never used smokeless tobacco. He reports that he drinks alcohol. He reports that he does not use illicit drugs.  Family History  Problem Relation Age of Onset  . Emphysema Paternal Grandfather   . Heart disease Paternal Grandfather   . Allergies Mother   . Hypertension Mother   . Skin cancer Father   . Colon cancer Neg Hx     As in HPI and primary teams notes otherwise 12 point review  of systems is negative  Blood pressure 113/65, pulse 77, temperature 98.2 F (36.8 C), temperature source Oral, resp. rate 18, height 6' (1.829 m), weight 227 lb 15.3 oz (103.4 kg), SpO2 97.00%. General: Alert and awake, oriented x3, not in any acute distress. HEENT: anicteric sclera,  EOMI,  CVS regular rate, normal r,  no murmur rubs or gallops Chest: clear to auscultation bilaterally, no wheezing, rales or rhonchi Abdomen: surgical sites are clean, drain is in place with purulent material in drain Extremities: no   edema  Skin: no rashes Neuro: nonfocal, strength and sensation intact   Results for orders placed during the hospital encounter of 07/01/14 (from the past 48  hour(s))  GLUCOSE, CAPILLARY     Status: Abnormal   Collection Time    07/13/14  5:49 PM      Result Value Ref Range   Glucose-Capillary 131 (*) 70 - 99 mg/dL  GLUCOSE, CAPILLARY     Status: Abnormal   Collection Time    07/14/14 12:20 AM      Result Value Ref Range   Glucose-Capillary 143 (*) 70 - 99 mg/dL  COMPREHENSIVE METABOLIC PANEL     Status: Abnormal   Collection Time    07/14/14  5:00 AM      Result Value Ref Range   Sodium 134 (*) 137 - 147 mEq/L   Potassium 4.5  3.7 - 5.3 mEq/L   Chloride 99  96 - 112 mEq/L   CO2 21  19 - 32 mEq/L   Glucose, Bld 138 (*) 70 - 99 mg/dL   BUN 17  6 - 23 mg/dL   Creatinine, Ser 1.22  0.50 - 1.35 mg/dL   Calcium 8.8  8.4 - 10.5 mg/dL   Total Protein 6.9  6.0 - 8.3 g/dL   Albumin 2.5 (*) 3.5 - 5.2 g/dL   AST 20  0 - 37 U/L   ALT 37  0 - 53 U/L   Alkaline Phosphatase 86  39 - 117 U/L   Total Bilirubin 0.4  0.3 - 1.2 mg/dL   GFR calc non Af Amer 70 (*) >90 mL/min   GFR calc Af Amer 81 (*) >90 mL/min   Comment: (NOTE)     The eGFR has been calculated using the CKD EPI equation.     This calculation has not been validated in all clinical situations.     eGFR's persistently <90 mL/min signify possible Chronic Kidney     Disease.   Anion gap 14  5 - 15  MAGNESIUM     Status: None   Collection Time    07/14/14  5:00 AM      Result Value Ref Range   Magnesium 2.2  1.5 - 2.5 mg/dL  PHOSPHORUS     Status: None   Collection Time    07/14/14  5:00 AM      Result Value Ref Range   Phosphorus 3.8  2.3 - 4.6 mg/dL  CBC     Status: Abnormal   Collection Time    07/14/14  5:00 AM      Result Value Ref Range   WBC 21.7 (*) 4.0 - 10.5 K/uL   RBC 3.25 (*) 4.22 - 5.81 MIL/uL   Hemoglobin 9.7 (*) 13.0 - 17.0 g/dL   HCT 29.5 (*) 39.0 - 52.0 %   MCV 90.8  78.0 - 100.0 fL   MCH 29.8  26.0 - 34.0 pg   MCHC 32.9  30.0 - 36.0 g/dL  RDW 13.7  11.5 - 15.5 %   Platelets 695 (*) 150 - 400 K/uL  DIFFERENTIAL     Status: Abnormal   Collection Time     07/14/14  5:00 AM      Result Value Ref Range   Neutrophils Relative % 78 (*) 43 - 77 %   Neutro Abs 16.8 (*) 1.7 - 7.7 K/uL   Lymphocytes Relative 8 (*) 12 - 46 %   Lymphs Abs 1.8  0.7 - 4.0 K/uL   Monocytes Relative 12  3 - 12 %   Monocytes Absolute 2.6 (*) 0.1 - 1.0 K/uL   Eosinophils Relative 2  0 - 5 %   Eosinophils Absolute 0.4  0.0 - 0.7 K/uL   Basophils Relative 0  0 - 1 %   Basophils Absolute 0.1  0.0 - 0.1 K/uL  TRIGLYCERIDES     Status: None   Collection Time    07/14/14  5:00 AM      Result Value Ref Range   Triglycerides 56  <150 mg/dL   Comment: Performed at [facility]     Status: Abnormal   Collection Time    07/14/14  5:00 AM      Result Value Ref Range   Prealbumin 14.4 (*) 17.0 - 34.0 mg/dL   Comment: Performed at [place], CAPILLARY     Status: Abnormal   Collection Time    07/14/14  5:58 AM      Result Value Ref Range   Glucose-Capillary 143 (*) 70 - 99 mg/dL  GLUCOSE, CAPILLARY     Status: Abnormal   Collection Time    07/14/14 11:52 AM      Result Value Ref Range   Glucose-Capillary 125 (*) 70 - 99 mg/dL  GLUCOSE, CAPILLARY     Status: Abnormal   Collection Time    07/14/14  5:51 PM      Result Value Ref Range   Glucose-Capillary 126 (*) 70 - 99 mg/dL  GLUCOSE, CAPILLARY     Status: Abnormal   Collection Time    07/14/14 11:38 PM      Result Value Ref Range   Glucose-Capillary 140 (*) 70 - 99 mg/dL  CBC     Status: Abnormal   Collection Time    07/15/14  5:58 AM      Result Value Ref Range   WBC 15.7 (*) 4.0 - 10.5 K/uL   RBC 3.33 (*) 4.22 - 5.81 MIL/uL   Hemoglobin 10.0 (*) 13.0 - 17.0 g/dL   HCT 30.1 (*) 39.0 - 52.0 %   MCV 90.4  78.0 - 100.0 fL   MCH 30.0  26.0 - 34.0 pg   MCHC 33.2  30.0 - 36.0 g/dL   RDW 13.5  11.5 - 15.5 %   Platelets 764 (*) 150 - 400 K/uL  GLUCOSE, CAPILLARY     Status: Abnormal   Collection Time    07/15/14  6:01 AM      Result Value Ref Range   Glucose-Capillary 138  (*) 70 - 99 mg/dL  GLUCOSE, CAPILLARY     Status: Abnormal   Collection Time    07/15/14 12:04 PM      Result Value Ref Range   Glucose-Capillary 107 (*) 70 - 99 mg/dL   @BRIEFLABTABLE (sdes,specrequest,cult,reptstatus)   ) Recent Results (from the past 720 hour(s))  CLOSTRIDIUM DIFFICILE BY PCR     Status: None   Collection Time  07/11/14  9:45 AM      Result Value Ref Range Status   C difficile by pcr NEGATIVE  NEGATIVE Final   Comment: Performed at [place]     Status: None   Collection Time    07/12/14  8:53 AM      Result Value Ref Range Status   Specimen Description ABSCESS PERITONEAL   Final   Special Requests Normal   Final   Gram Stain     Final   Value: MODERATE WBC PRESENT, PREDOMINANTLY MONONUCLEAR     FEW WBC PRESENT, PREDOMINANTLY PMN     NO     SQUAMOUS EPITHELIAL CELLS PRESENT     MODERATE GRAM NEGATIVE RODS   Culture     Final   Value: ABUNDANT ESCHERICHIA COLI     Note: Confirmed Extended Spectrum Beta-Lactamase Producer (ESBL) CRITICAL RESULT CALLED TO, READ BACK BY AND VERIFIED WITH: Larkin Ina 07/14/14 1115 BY SMITHERSJ     Performed at [facility]   Report Status 07/14/2014 FINAL   Final   Organism ID, Bacteria ESCHERICHIA COLI   Final     Impression/Recommendation  Principal Problem:   Sigmoid diverticulitis-recurrent s/p lap assisted subtotal colectomy   [name] [name] is a 48 y.o. male with  Complicated diverticulitis sp LAP total colectomy, then found to have intraabdominal abscess with ESBL  #1 Intraabdominal abscess with ESBL:  --continue Invanz 1 gram daily --would continue this for at least [place] that the abscess has resolved radiographically--if not would go for longer than 2 weeks  And until abscess has resolved, possibly out to a month  #2 Screening: will screen him for HIV and  HCV  07/15/2014, 1:52 PM   Thank you so much for this interesting  consult  [facility] [place] [phone] (pager) [phone] (office) 07/15/2014, 1:52 PM  [place] 07/15/2014, 1:52 PM

## 2014-07-16 ENCOUNTER — Other Ambulatory Visit (HOSPITAL_COMMUNITY): Payer: Self-pay

## 2014-07-16 ENCOUNTER — Encounter (HOSPITAL_COMMUNITY): Payer: Self-pay | Admitting: Radiology

## 2014-07-16 ENCOUNTER — Inpatient Hospital Stay (HOSPITAL_COMMUNITY): Payer: BC Managed Care – PPO

## 2014-07-16 LAB — CBC
HCT: 34.1 % — ABNORMAL LOW (ref 39.0–52.0)
Hemoglobin: 11 g/dL — ABNORMAL LOW (ref 13.0–17.0)
MCH: 29.4 pg (ref 26.0–34.0)
MCHC: 32.3 g/dL (ref 30.0–36.0)
MCV: 91.2 fL (ref 78.0–100.0)
Platelets: 989 10*3/uL (ref 150–400)
RBC: 3.74 MIL/uL — ABNORMAL LOW (ref 4.22–5.81)
RDW: 13.7 % (ref 11.5–15.5)
WBC: 20.9 10*3/uL — ABNORMAL HIGH (ref 4.0–10.5)

## 2014-07-16 LAB — HEPATITIS PANEL, ACUTE
HCV Ab: NEGATIVE
Hep A IgM: NONREACTIVE
Hep B C IgM: NONREACTIVE
Hepatitis B Surface Ag: NEGATIVE

## 2014-07-16 LAB — GLUCOSE, CAPILLARY
GLUCOSE-CAPILLARY: 110 mg/dL — AB (ref 70–99)
Glucose-Capillary: 103 mg/dL — ABNORMAL HIGH (ref 70–99)
Glucose-Capillary: 108 mg/dL — ABNORMAL HIGH (ref 70–99)
Glucose-Capillary: 119 mg/dL — ABNORMAL HIGH (ref 70–99)
Glucose-Capillary: 119 mg/dL — ABNORMAL HIGH (ref 70–99)

## 2014-07-16 MED ORDER — IOHEXOL 300 MG/ML  SOLN
100.0000 mL | Freq: Once | INTRAMUSCULAR | Status: AC | PRN
  Administered 2014-07-16: 100 mL via INTRAVENOUS

## 2014-07-16 MED ORDER — OXYCODONE HCL 5 MG PO TABS
5.0000 mg | ORAL_TABLET | ORAL | Status: DC | PRN
  Administered 2014-07-16 (×2): 10 mg via ORAL
  Filled 2014-07-16 (×3): qty 2

## 2014-07-16 MED ORDER — IOHEXOL 300 MG/ML  SOLN: 25.0000 mL | INTRAMUSCULAR | Status: AC

## 2014-07-16 NOTE — Progress Notes (Signed)
15 Days Post-Op  Subjective: Feeling better today.  Tolerating some solid food.  Objective: Vital signs in last 24 hours: Temp:  [98.4 F (36.9 C)-98.9 F (37.2 C)] 98.4 F (36.9 C) (10/07 0600) Pulse Rate:  [75-84] 75 (10/07 0600) Resp:  [18] 18 (10/07 0600) BP: (112-118)/(64-78) 112/64 mmHg (10/07 0600) SpO2:  [99 %-100 %] 99 % (10/07 0600) Last BM Date: 07/16/14  Intake/Output from previous day: 10/06 0701 - 10/07 0700 In: 1290 [TPN:1280] Out: 25 [Drains:25] Intake/Output this shift:    PE: General- In NAD Abdomen-soft, incisions are clean and intact, small amouint of serous left flank drain output Lab Results:   Recent Labs  07/14/14 0500 07/15/14 0558  WBC 21.7* 15.7*  HGB 9.7* 10.0*  HCT 29.5* 30.1*  PLT 695* 764*   BMET  Recent Labs  07/14/14 0500  NA 134*  K 4.5  CL 99  CO2 21  GLUCOSE 138*  BUN 17  CREATININE 1.22  CALCIUM 8.8   PT/INR No results found for this basename: LABPROT, INR,  in the last 72 hours Comprehensive Metabolic Panel:    Component Value Date/Time   NA 134* 07/14/2014 0500   NA 135* 07/13/2014 0430   K 4.5 07/14/2014 0500   K 4.4 07/13/2014 0430   CL 99 07/14/2014 0500   CL 102 07/13/2014 0430   CO2 21 07/14/2014 0500   CO2 20 07/13/2014 0430   BUN 17 07/14/2014 0500   BUN 16 07/13/2014 0430   CREATININE 1.22 07/14/2014 0500   CREATININE 1.21 07/13/2014 0430   GLUCOSE 138* 07/14/2014 0500   GLUCOSE 144* 07/13/2014 0430   CALCIUM 8.8 07/14/2014 0500   CALCIUM 8.6 07/13/2014 0430   AST 20 07/14/2014 0500   AST 32 07/10/2014 0535   ALT 37 07/14/2014 0500   ALT 65* 07/10/2014 0535   ALKPHOS 86 07/14/2014 0500   ALKPHOS 73 07/10/2014 0535   BILITOT 0.4 07/14/2014 0500   BILITOT 0.5 07/10/2014 0535   PROT 6.9 07/14/2014 0500   PROT 6.3 07/10/2014 0535   ALBUMIN 2.5* 07/14/2014 0500   ALBUMIN 2.4* 07/10/2014 0535     Studies/Results: No results found.  Anti-infectives: Anti-infectives   Start     Dose/Rate Route Frequency Ordered  Stop   07/14/14 1600  ertapenem (INVANZ) 1 g in sodium chloride 0.9 % 50 mL IVPB     1 g 100 mL/hr over 30 Minutes Intravenous Every 24 hours 07/14/14 1457     07/14/14 0800  piperacillin-tazobactam (ZOSYN) IVPB 3.375 g  Status:  Discontinued     3.375 g 12.5 mL/hr over 240 Minutes Intravenous Every 8 hours 07/14/14 0723 07/14/14 1456   07/08/14 2100  vancomycin (VANCOCIN) IVPB 1000 mg/200 mL premix  Status:  Discontinued     1,000 mg 200 mL/hr over 60 Minutes Intravenous Every 12 hours 07/08/14 1104 07/10/14 0832   07/08/14 0800  vancomycin (VANCOCIN) 2,000 mg in sodium chloride 0.9 % 500 mL IVPB     2,000 mg 250 mL/hr over 120 Minutes Intravenous  Once 07/08/14 0757 07/08/14 1107   07/07/14 1000  piperacillin-tazobactam (ZOSYN) IVPB 3.375 g  Status:  Discontinued     3.375 g 12.5 mL/hr over 240 Minutes Intravenous Every 8 hours 07/07/14 0821 07/10/14 0832   07/01/14 2000  cefoTEtan (CEFOTAN) 2 g in dextrose 5 % 50 mL IVPB     2 g 100 mL/hr over 30 Minutes Intravenous Every 12 hours 07/01/14 1339 07/01/14 2015   07/01/14 0625  cefoTEtan in  Dextrose 5% (CEFOTAN) IVPB 2 g  Status:  Discontinued     2 g Intravenous Every 12 hours 07/01/14 0625 07/01/14 1413      Assessment Principal Problem:  1. Sigmoid diverticulitis-recurrent s/p lap assisted subtotal colectomy  2. Post op E. Coli intraabdominal abscess- organism sensitive only to Gentamicin and Primaxin; InVanz started, WBC pending, drain output serous   3.  PC Malnutrition- on TPN.     LOS: 15 days   Plan: D/C TPN.  Repeat CT tomorrow.  Minimum of two weeks of InVanz.  [name] [name] 07/16/2014

## 2014-07-16 NOTE — Progress Notes (Signed)
CRITICAL VALUE ALERT  Critical value received:  Platelets [phone]  Date of notification:  07/16/14  Time of notification:  1432  Critical value read back:Yes.    Nurse who received alert:  Melene MullerJeannie Jeanpierre Thebeau, RN  MD notified (1st page):  Ashok NorrisEmina Riebock, [place] (for Dr. Abbey Chattersosenbower)  Time of first page:  1436  MD notified (2nd page):  Time of second page:  Responding MD:  Ashok NorrisEmina Riebock, PA  Time MD responded:  [phone]

## 2014-07-16 NOTE — Plan of Care (Signed)
Problem: Phase III Progression Outcomes Goal: Pain controlled on oral analgesia Outcome: Progressing Began po meds this afternoon.

## 2014-07-16 NOTE — Progress Notes (Signed)
[facility]  Day # 3 of Invanz  Subjective: No new complaints   Antibiotics:  Anti-infectives   Start     Dose/Rate Route Frequency Ordered Stop   07/14/14 1600  ertapenem (INVANZ) 1 g in sodium chloride 0.9 % 50 mL IVPB     1 g 100 mL/hr over 30 Minutes Intravenous Every 24 hours 07/14/14 1457     07/14/14 0800  piperacillin-tazobactam (ZOSYN) IVPB 3.375 g  Status:  Discontinued     3.375 g 12.5 mL/hr over 240 Minutes Intravenous Every 8 hours 07/14/14 0723 07/14/14 1456   07/08/14 2100  vancomycin (VANCOCIN) IVPB 1000 mg/200 mL premix  Status:  Discontinued     1,000 mg 200 mL/hr over 60 Minutes Intravenous Every 12 hours 07/08/14 1104 07/10/14 0832   07/08/14 0800  vancomycin (VANCOCIN) 2,000 mg in sodium chloride 0.9 % 500 mL IVPB     2,000 mg 250 mL/hr over 120 Minutes Intravenous  Once 07/08/14 0757 07/08/14 1107   07/07/14 1000  piperacillin-tazobactam (ZOSYN) IVPB 3.375 g  Status:  Discontinued     3.375 g 12.5 mL/hr over 240 Minutes Intravenous Every 8 hours 07/07/14 0821 07/10/14 0832   07/01/14 2000  cefoTEtan (CEFOTAN) 2 g in dextrose 5 % 50 mL IVPB     2 g 100 mL/hr over 30 Minutes Intravenous Every 12 hours 07/01/14 1339 07/01/14 2015   07/01/14 0625  cefoTEtan in Dextrose 5% (CEFOTAN) IVPB 2 g  Status:  Discontinued     2 g Intravenous Every 12 hours 07/01/14 0625 07/01/14 1413      Medications: Scheduled Meds: . enoxaparin (LOVENOX) injection  40 mg Subcutaneous Q24H  . ertapenem  1 g Intravenous Q24H  . feeding supplement (RESOURCE BREEZE)  1 Container Oral TID BM  . multivitamin with minerals  1 tablet Oral Daily  . pantoprazole (PROTONIX) IV  40 mg Intravenous QHS   Continuous Infusions:  PRN Meds:.morphine injection, ondansetron (ZOFRAN) IV, ondansetron, oxyCODONE, promethazine, sodium chloride    Objective: Weight change:   Intake/Output Summary (Last 24 hours) at 07/16/14 1910 Last data filed at 07/16/14 1800  Gross per 24 hour  Intake   1665 ml  Output     34 ml  Net   1631 ml   Blood pressure 128/73, pulse 82, temperature 97.8 F (36.6 C), temperature source Oral, resp. rate 16, height 6' (1.829 m), weight 227 lb 15.3 oz (103.4 kg), SpO2 100.00%. Temp:  [97.8 F (36.6 C)-98.9 F (37.2 C)] 97.8 F (36.6 C) (10/07 1400) Pulse Rate:  [75-84] 82 (10/07 1400) Resp:  [16-18] 16 (10/07 1400) BP: (112-128)/(64-78) 128/73 mmHg (10/07 1400) SpO2:  [99 %-100 %] 100 % (10/07 1400)  Physical Exam: General: Alert and awake, oriented x3, not in any acute distress.  HEENT: anicteric sclera, EOMI,  CVS regular rate, normal r, no murmur rubs or gallops  Chest: clear to auscultation bilaterally, no wheezing, rales or rhonchi  Abdomen: surgical sites are clean, JP drain is in place with more sanguinous material in drain today Extremities: no edema ,  Skin: no rashes  Neuro: nonfocal, strength and sensation intact   CBC:  Recent Labs Lab 07/12/14 0508 07/13/14 0430 07/14/14 0500 07/15/14 0558 07/16/14 0830  HGB 9.4* 9.7* 9.7* 10.0* 11.0*  HCT 28.7* 29.0* 29.5* 30.1* 34.1*  PLT 602* 695* 695* 764* 989*  INR 1.19  --   --   --   --   APTT 34  --   --   --   --  BMET  Recent Labs  07/14/14 0500  NA 134*  K 4.5  CL 99  CO2 21  GLUCOSE 138*  BUN 17  CREATININE 1.22  CALCIUM 8.8     Liver Panel   Recent Labs  07/14/14 0500  PROT 6.9  ALBUMIN 2.5*  AST 20  ALT 37  ALKPHOS 86  BILITOT 0.4       Sedimentation Rate No results found for this basename: ESRSEDRATE,  in the last 72 hours C-Reactive Protein No results found for this basename: CRP,  in the last 72 hours  Micro Results: Recent Results (from the past 720 hour(s))  CLOSTRIDIUM DIFFICILE BY PCR     Status: None   Collection Time    07/11/14  9:45 AM      Result Value Ref Range Status   C difficile by pcr NEGATIVE  NEGATIVE Final   Comment: Performed at [facility]  CULTURE, ROUTINE-ABSCESS      Status: None   Collection Time    07/12/14  8:53 AM      Result Value Ref Range Status   Specimen Description ABSCESS PERITONEAL   Final   Special Requests Normal   Final   Gram Stain     Final   Value: MODERATE WBC PRESENT, PREDOMINANTLY MONONUCLEAR     FEW WBC PRESENT, PREDOMINANTLY PMN     NO     SQUAMOUS EPITHELIAL CELLS PRESENT     MODERATE GRAM NEGATIVE RODS   Culture     Final   Value: ABUNDANT ESCHERICHIA COLI     Note: Confirmed Extended Spectrum Beta-Lactamase Producer (ESBL) CRITICAL RESULT CALLED TO, READ BACK BY AND VERIFIED WITH: KATHERINE STRUPE 07/14/14 1115 BY SMITHERSJ     Performed at [facility]   Report Status 07/14/2014 FINAL   Final   Organism ID, Bacteria ESCHERICHIA COLI   Final    Studies/Results: Ct Abdomen Pelvis W Contrast  07/16/2014   CLINICAL DATA:  Evaluate for intra-abdominal abscess. Patient has had abscess drainage twice with pain at the incision site.  EXAM: CT ABDOMEN AND PELVIS WITH CONTRAST  TECHNIQUE: Multidetector CT imaging of the abdomen and pelvis was performed using the standard protocol following bolus administration of intravenous contrast.  CONTRAST:  OMNIPAQUE IOHEXOL 300 MG/ML  SOLN  COMPARISON:  July 12, 2014, July 11, 2014  FINDINGS: A pigtail abscess strain is identified in pancreatic fluid collection at the body of the pancreas. The abscess is decreased in size, currently measuring 3.4 x 3.7 cm. No new abscess is noted. There are dilated small bowel loops throughout the abdomen pelvis probably due to ileus.  There are cysts within the liver unchanged. The spleen, gallbladder, adrenal glands and right kidney are normal. There is a 1 cm cyst in the upper pole left kidney unchanged. There is no hydronephrosis bilaterally. The aorta is normal. There is no abdominal lymphadenopathy. Patient is status post prior subtotal colectomy. Small portion of rectal sigmoid colon remains. The previously noted left pelvic drain has been  removed. Fluid-filled bladder is normal.  The visualized lung bases demonstrate mild atelectasis of the left lung base. No acute abnormalities identified within the visualized bones.  IMPRESSION: Drain is identified within the fluid collection at the pancreatic body. The size of fluid collection at the pancreatic body is decreased currently measuring 3.4 x 3.7 cm.  Persistent small bowel ileus.   Electronically Signed   By: Sherian Rein M.D.   On: 07/16/2014 11:03  Assessment/Plan:  Principal Problem:   Sigmoid diverticulitis-recurrent s/p lap assisted subtotal colectomy    DEMETRIOUS RAINFORD is a 48 y.o. male with Complicated diverticulitis sp LAP total colectomy, then found to have intraabdominal abscess with ESBL   #1 Intraabdominal abscess with ESBL: His WBC has risen again as has his platelets which might suggest signficant inflammatory process still at work  Certainly with his exposure to very broad spectrum abx and his clinical stable picture I would worry about C Difficile though he is only having 3-4 loose bm and not with obvious ileus  His CT scan lungs only with atelectasis  His abscess is smaller on repeat CT today though not copious amount coming out  For now I would continue the [facility]  I do not see any benefit to broading beyond this without another source of infection being ID'd   #2 Screening: will screen him for HIV and Hep panel is negative    LOS: 15 days   Paulette Blanch Dam 07/16/2014, 7:10 PM

## 2014-07-16 NOTE — Progress Notes (Signed)
NUTRITION FOLLOW UP  Intervention:   - Encouraged continued excellent PO intake - Continue Resource Breeze TID - RD to continue to monitor   Nutrition Dx:   Inadequate oral intake related to clear liquid diet as evidenced by diet order - no longer appropriate, diet advanced  New nutrition dx: Increased nutrient needs related to diverticulitis with abscess as evidenced by MD notes.    Goal:   1. TPN to meet >90% of estimated nutritional needs - not met, TPN d/c  2. Advance diet as tolerated to soft diet - met  New goal: Pt to consume >90% of meals/supplements   Monitor:   Weights, labs, intake   Assessment:   Pt with recurrent sigmoid diverticulitis, admitted for subtotal colectomy which was performed 9/22. Developed postop ileus. Had some crampy upper abdominal pain then nausea and vomiting-basically everything he ate yesterday per MD. Scheduled to start TPN today.   9/29: - Met with pt who reports recently his appetite has been down with pt consuming 50% of usual intake of 2-3 well balanced meals/day  - Denies any significant changes in weight, says it fluctuates between 10-12 pounds  - C/o "hard" burping, 7-8 times/day with his stomach feeling more bloated  - Thinks he's having some nausea from gas - notified RN  - Performed nutrition focused physical exam which was WNL   10/1: - Per MD, pt's ileus continues - No nausea today or yesterday - Met with pt who had not yet ordered clear liquid tray this morning, had been up walking - [place] he did well with clear liquid diet yesterday - Diet intake discussed with pharmacist   10/5: - CT of abdomen/pelvis 10/2 demonstrates the peripancreatic fluid collection which was drained 10/3  - Remains on clear liquid diet - Met with pt who reports he's been tolerating clear liquid diet taking sips of broth and eating most of the jellos - Said he's been drinking 50-75% of Resource Breezes - C/o little bit of nausea and soreness from  drain placement  10/7: - TPN d/c today - Diet advanced to soft yesterday - Met with pt who reports minimal intake yesterday due to nausea however ate most of his french toast and cereal this morning and no nausea today - Said he drank 1 chocolate muscle milk and 1 Resource Breeze yesterday, none today     Height: Ht Readings from Last 1 Encounters:  07/01/14 6' (1.829 m)    Weight Status:   Wt Readings from Last 1 Encounters:  07/01/14 227 lb 15.3 oz (103.4 kg)    Re-estimated needs:  Kcal: 2426-8341  Protein: 120-140g  Fluid: 1.9-2.2L/day   Skin: surgical incision on abdomen   Diet Order: Criss Rosales   Intake/Output Summary (Last 24 hours) at 07/16/14 1154 Last data filed at 07/16/14 1000  Gross per 24 hour  Intake    950 ml  Output     25 ml  Net    925 ml    Last BM: 10/7   Labs:   Recent Labs Lab 07/10/14 0535  07/12/14 0508 07/13/14 0430 07/14/14 0500  NA 139  < > 134* 135* 134*  K 3.4*  < > 4.1 4.4 4.5  CL 104  < > 101 102 99  CO2 20  < > _0 BUN 15  < > _1 CREATININE 1.23  < > 1.27 1.21 1.22  CALCIUM 8.3*  < > 8.6 8.6 8.8  MG 2.5  --   --   --  2.2  PHOS 4.3  --   --   --  3.8  GLUCOSE 154*  < > 137* 144* 138*  < > = values in this interval not displayed.  CBG (last 3)   Recent Labs  07/15/14 1820 07/16/14 0003 07/16/14 0629  GLUCAP 117* 110* 108*    Scheduled Meds: . enoxaparin (LOVENOX) injection  40 mg Subcutaneous Q24H  . ertapenem  1 g Intravenous Q24H  . feeding supplement (RESOURCE BREEZE)  1 Container Oral TID BM  . multivitamin with minerals  1 tablet Oral Daily  . pantoprazole (PROTONIX) IV  40 mg Intravenous QHS      Carlis Stable MS, RD, [place] [phone] Pager [phone] Weekend/After Hours Pager

## 2014-07-17 ENCOUNTER — Inpatient Hospital Stay (HOSPITAL_COMMUNITY): Payer: BC Managed Care – PPO

## 2014-07-17 DIAGNOSIS — D72829 Elevated white blood cell count, unspecified: Secondary | ICD-10-CM

## 2014-07-17 LAB — GLUCOSE, CAPILLARY
GLUCOSE-CAPILLARY: 108 mg/dL — AB (ref 70–99)
Glucose-Capillary: 108 mg/dL — ABNORMAL HIGH (ref 70–99)
Glucose-Capillary: 139 mg/dL — ABNORMAL HIGH (ref 70–99)
Glucose-Capillary: 130 mg/dL — ABNORMAL HIGH (ref 70–99)

## 2014-07-17 LAB — HIV-1 RNA QUANT-NO REFLEX-BLD
HIV 1 RNA Quant: <20 copies/mL (ref <20)
HIV-1 RNA Quant, Log: <1.3 {Log} (ref <1.30)

## 2014-07-17 LAB — CBC
HCT: 32.8 % — ABNORMAL LOW (ref 39.0–52.0)
Hemoglobin: 10.7 g/dL — ABNORMAL LOW (ref 13.0–17.0)
MCH: 28.8 pg (ref 26.0–34.0)
MCHC: 32.6 g/dL (ref 30.0–36.0)
MCV: 88.4 fL (ref 78.0–100.0)
Platelets: 963 10*3/uL (ref 150–400)
RBC: 3.71 MIL/uL — AB (ref 4.22–5.81)
RDW: 13.6 % (ref 11.5–15.5)
WBC: 24.4 10*3/uL — ABNORMAL HIGH (ref 4.0–10.5)

## 2014-07-17 LAB — AMYLASE, PERITONEAL FLUID: Amylase, peritoneal fluid: 14 U/L

## 2014-07-17 LAB — PATHOLOGIST SMEAR REVIEW

## 2014-07-17 LAB — CLOSTRIDIUM DIFFICILE BY PCR: CDIFFPCR: NEGATIVE

## 2014-07-17 LAB — LIPASE, BLOOD: Lipase: 38 U/L (ref 11–59)

## 2014-07-17 LAB — AMYLASE: Amylase: 52 U/L (ref 0–105)

## 2014-07-17 MED ORDER — KCL IN DEXTROSE-NACL 30-5-0.45 MEQ/L-%-% IV SOLN
INTRAVENOUS | Status: DC
  Administered 2014-07-17 – 2014-07-18 (×2): via INTRAVENOUS
  Filled 2014-07-17 (×4): qty 1000

## 2014-07-17 MED ORDER — HYDROCODONE-ACETAMINOPHEN 5-325 MG PO TABS: 1.0000 | ORAL_TABLET | ORAL | Status: DC | PRN

## 2014-07-17 MED ORDER — SODIUM CHLORIDE 0.9 % IV SOLN
500.0000 mg | Freq: Four times a day (QID) | INTRAVENOUS | Status: DC
  Administered 2014-07-17 – 2014-07-20 (×11): 500 mg via INTRAVENOUS
  Filled 2014-07-17 (×13): qty 500

## 2014-07-17 NOTE — Progress Notes (Signed)
Patient ID: [name] CampanileDonald S [name], male   DOB: [birth year], [age] y.o.   MRN: [id number]   Referring Physician(s): CCS  Subjective: pt feeling about the same; still with abd discomfort near drain sites; intermittent nausea, exacerbated by recent oxycodone; has about 3 loose BM's per day    Allergies: Celebrex; Sulfa antibiotics; Codeine; and Dilaudid  Medications: Prior to Admission medications   Medication Sig Start Date End Date Taking? Authorizing Provider  ciprofloxacin (CIPRO) 500 MG tablet Take 1 tablet (500 mg total) by mouth 2 (two) times daily. 06/18/14  Yes Enid SkeensJoshua M Zavitz, MD  diphenhydrAMINE (BENADRYL) 25 MG tablet Take 25 mg by mouth every 6 (six) hours as needed for allergies.    Yes Historical Provider, MD  hyoscyamine (LEVSIN SL) 0.125 MG SL tablet Place 0.125 mg under the tongue every 6 (six) hours as needed for cramping.   Yes Historical Provider, MD  ondansetron (ZOFRAN-ODT) 4 MG disintegrating tablet Take 4 mg by mouth every 4 (four) hours as needed for nausea or vomiting.   Yes Historical Provider, MD  oxyCODONE-acetaminophen (PERCOCET/ROXICET) 5-325 MG per tablet Take 2 tablets by mouth every 4 (four) hours as needed for severe pain. 03/24/14  Yes Jillyn LedgerJessica K Palmer, PA-C  oxyCODONE-acetaminophen (PERCOCET/ROXICET) 5-325 MG per tablet Take 1 tablet by mouth every 4 (four) hours as needed for moderate pain or severe pain.   Yes Historical Provider, MD  zolpidem (AMBIEN) 10 MG tablet Take 10 mg by mouth at bedtime as needed for sleep.    Yes Historical Provider, MD  colchicine 0.6 MG tablet Take 0.6 mg by mouth 2 (two) times daily as needed (For gout.).  06/04/14   Historical Provider, MD  losartan-hydrochlorothiazide (HYZAAR) 50-12.5 MG per tablet Take 0.5 tablets by mouth at bedtime.  11/18/13   Historical Provider, MD  metroNIDAZOLE (FLAGYL) 500 MG tablet Take 1 tablet (500 mg total) by mouth 3 (three) times daily. 06/18/14   Enid SkeensJoshua M Zavitz, MD  Probiotic Product (PROBIOTIC DAILY PO)  Take 1 capsule by mouth at bedtime.    Historical Provider, MD    Review of Systems see above  Vital Signs: BP 118/74  Pulse 90  Temp(Src) 98.8 F (37.1 C) (Oral)  Resp 18  Ht 6' (1.829 m)  Wt 227 lb 15.3 oz (103.4 kg)  BMI 30.91 kg/m2  SpO2 98%  Physical Exam pt awake/alert; left abd (IR) drain intact, insertion site ok, mild-mod tender, mild erythema; output about 10 cc's cream colored fluid; repeat amylase pend; drain flushed with 10 cc's sterile NS with return of same  Imaging: Ct Abdomen Pelvis W Contrast  07/16/2014   CLINICAL DATA:  Evaluate for intra-abdominal abscess. Patient has had abscess drainage twice with pain at the incision site.  EXAM: CT ABDOMEN AND PELVIS WITH CONTRAST  TECHNIQUE: Multidetector CT imaging of the abdomen and pelvis was performed using the standard protocol following bolus administration of intravenous contrast.  CONTRAST:  100mL OMNIPAQUE IOHEXOL 300 MG/ML  SOLN  COMPARISON:  July 12, 2014, July 11, 2014  FINDINGS: A pigtail abscess strain is identified in pancreatic fluid collection at the body of the pancreas. The abscess is decreased in size, currently measuring 3.4 x 3.7 cm. No new abscess is noted. There are dilated small bowel loops throughout the abdomen pelvis probably due to ileus.  There are cysts within the liver unchanged. The spleen, gallbladder, adrenal glands and right kidney are normal. There is a 1 cm cyst in the upper pole left kidney unchanged. There is  no hydronephrosis bilaterally. The aorta is normal. There is no abdominal lymphadenopathy. Patient is status post prior subtotal colectomy. Small portion of rectal sigmoid colon remains. The previously noted left pelvic drain has been removed. Fluid-filled bladder is normal.  The visualized lung bases demonstrate mild atelectasis of the left lung base. No acute abnormalities identified within the visualized bones.  IMPRESSION: Drain is identified within the fluid collection at the  pancreatic body. The size of fluid collection at the pancreatic body is decreased currently measuring 3.4 x 3.7 cm.  Persistent small bowel ileus.   Electronically Signed   By: Sherian Rein M.D.   On: 07/16/2014 11:03    Labs:  CBC:  Recent Labs  07/14/14 0500 07/15/14 0558 07/16/14 0830 07/17/14 0500  WBC 21.7* 15.7* 20.9* 24.4*  HGB 9.7* 10.0* 11.0* 10.7*  HCT 29.5* 30.1* 34.1* 32.8*  PLT 695* 764* 989* 963*    COAGS:  Recent Labs  06/25/14 1445 07/12/14 0508  INR 1.08 1.19  APTT  --  34    BMP:  Recent Labs  07/11/14 0425 07/12/14 0508 07/13/14 0430 07/14/14 0500  NA 133* 134* 135* 134*  K 3.6* 4.1 4.4 4.5  CL 100 101 102 99  CO2 21 21 20 21   GLUCOSE 132* 137* 144* 138*  BUN 15 17 16 17   CALCIUM 8.4 8.6 8.6 8.8  CREATININE 1.20 1.27 1.21 1.22  GFRNONAA 71* 66* 70* 70*  GFRAA 82* 77* 81* 81*    LIVER FUNCTION TESTS:  Recent Labs  06/25/14 1445 07/09/14 0517 07/10/14 0535 07/14/14 0500  BILITOT 0.7 0.4 0.5 0.4  AST 25 43* 32 20  ALT 33 71* 65* 37  ALKPHOS 69 65 73 86  PROT 7.6 5.7* 6.3 6.9  ALBUMIN 4.2 2.2* 2.4* 2.5*    Assessment and Plan: Pt with history of recurrent sigmoid diverticulitis, s/p subtotal colectomy with partial omentectomy/incidental appendectomy 07/01/13; now with left RP abscess (?pseudocyst), s/p drainage 10/3; CT 10/7 shows decreased size of drained abscess, no additional abscesses, persistent SB ileus; WBC up to 24.4(20.9), ? Check c diff; cont with diligent irrigation of drain; other plans as per CCS/ID        I spent a total of 15 minutes face to face in clinical consultation/evaluation, greater than 50% of which was counseling/coordinating care for left abd abscess drainage.  Signed: Chinita Pester 07/17/2014, 9:53 AM

## 2014-07-17 NOTE — Progress Notes (Signed)
16 Days Post-Op  Subjective: Some abdominal pain after eating yesterday.  Oxycodone make him feel woozy.  Pasty BMs now.  Objective: Vital signs in last 24 hours: Temp:  [97.8 F (36.6 C)-98.8 F (37.1 C)] 98.8 F (37.1 C) (10/08 0615) Pulse Rate:  [82-91] 90 (10/08 0615) Resp:  [16-18] 18 (10/08 0615) BP: (115-128)/(70-74) 118/74 mmHg (10/08 0615) SpO2:  [97 %-100 %] 98 % (10/08 0615) Last BM Date: 07/17/14  Intake/Output from previous day: 10/07 0701 - 10/08 0700 In: 4465 [P.O.:3810; IV Piggyback:50; TPN:600] Out: 9 [Drains:9] Intake/Output this shift:    PE: General- In NAD Abdomen-soft, incisions are clean and intact, small amouint of slightly cloudy fluid in drain  Lab Results:   Recent Labs  07/16/14 0830 07/17/14 0500  WBC 20.9* 24.4*  HGB 11.0* 10.7*  HCT 34.1* 32.8*  PLT 989* 963*   BMET No results found for this basename: NA, K, CL, CO2, GLUCOSE, BUN, CREATININE, CALCIUM,  in the last 72 hours PT/INR No results found for this basename: LABPROT, INR,  in the last 72 hours Comprehensive Metabolic Panel:    Component Value Date/Time   NA 134* 07/14/2014 0500   NA 135* 07/13/2014 0430   K 4.5 07/14/2014 0500   K 4.4 07/13/2014 0430   CL 99 07/14/2014 0500   CL 102 07/13/2014 0430   CO2 21 07/14/2014 0500   CO2 20 07/13/2014 0430   BUN 17 07/14/2014 0500   BUN 16 07/13/2014 0430   CREATININE 1.22 07/14/2014 0500   CREATININE 1.21 07/13/2014 0430   GLUCOSE 138* 07/14/2014 0500   GLUCOSE 144* 07/13/2014 0430   CALCIUM 8.8 07/14/2014 0500   CALCIUM 8.6 07/13/2014 0430   AST 20 07/14/2014 0500   AST 32 07/10/2014 0535   ALT 37 07/14/2014 0500   ALT 65* 07/10/2014 0535   ALKPHOS 86 07/14/2014 0500   ALKPHOS 73 07/10/2014 0535   BILITOT 0.4 07/14/2014 0500   BILITOT 0.5 07/10/2014 0535   PROT 6.9 07/14/2014 0500   PROT 6.3 07/10/2014 0535   ALBUMIN 2.5* 07/14/2014 0500   ALBUMIN 2.4* 07/10/2014 0535     Studies/Results: Ct Abdomen Pelvis W Contrast  07/16/2014    CLINICAL DATA:  Evaluate for intra-abdominal abscess. Patient has had abscess drainage twice with pain at the incision site.  EXAM: CT ABDOMEN AND PELVIS WITH CONTRAST  TECHNIQUE: Multidetector CT imaging of the abdomen and pelvis was performed using the standard protocol following bolus administration of intravenous contrast.  CONTRAST:  100mL OMNIPAQUE IOHEXOL 300 MG/ML  SOLN  COMPARISON:  July 12, 2014, July 11, 2014  FINDINGS: A pigtail abscess strain is identified in pancreatic fluid collection at the body of the pancreas. The abscess is decreased in size, currently measuring 3.4 x 3.7 cm. No new abscess is noted. There are dilated small bowel loops throughout the abdomen pelvis probably due to ileus.  There are cysts within the liver unchanged. The spleen, gallbladder, adrenal glands and right kidney are normal. There is a 1 cm cyst in the upper pole left kidney unchanged. There is no hydronephrosis bilaterally. The aorta is normal. There is no abdominal lymphadenopathy. Patient is status post prior subtotal colectomy. Small portion of rectal sigmoid colon remains. The previously noted left pelvic drain has been removed. Fluid-filled bladder is normal.  The visualized lung bases demonstrate mild atelectasis of the left lung base. No acute abnormalities identified within the visualized bones.  IMPRESSION: Drain is identified within the fluid collection at the pancreatic body.  The size of fluid collection at the pancreatic body is decreased currently measuring 3.4 x 3.7 cm.  Persistent small bowel ileus.   Electronically Signed   By: Sherian Rein M.D.   On: 07/16/2014 11:03    Anti-infectives: Anti-infectives   Start     Dose/Rate Route Frequency Ordered Stop   07/14/14 1600  ertapenem (INVANZ) 1 g in sodium chloride 0.9 % 50 mL IVPB     1 g 100 mL/hr over 30 Minutes Intravenous Every 24 hours 07/14/14 1457     07/14/14 0800  piperacillin-tazobactam (ZOSYN) IVPB 3.375 g  Status:  Discontinued      3.375 g 12.5 mL/hr over 240 Minutes Intravenous Every 8 hours 07/14/14 0723 07/14/14 1456   07/08/14 2100  vancomycin (VANCOCIN) IVPB 1000 mg/200 mL premix  Status:  Discontinued     1,000 mg 200 mL/hr over 60 Minutes Intravenous Every 12 hours 07/08/14 1104 07/10/14 0832   07/08/14 0800  vancomycin (VANCOCIN) 2,000 mg in sodium chloride 0.9 % 500 mL IVPB     2,000 mg 250 mL/hr over 120 Minutes Intravenous  Once 07/08/14 0757 07/08/14 1107   07/07/14 1000  piperacillin-tazobactam (ZOSYN) IVPB 3.375 g  Status:  Discontinued     3.375 g 12.5 mL/hr over 240 Minutes Intravenous Every 8 hours 07/07/14 0821 07/10/14 0832   07/01/14 2000  cefoTEtan (CEFOTAN) 2 g in dextrose 5 % 50 mL IVPB     2 g 100 mL/hr over 30 Minutes Intravenous Every 12 hours 07/01/14 1339 07/01/14 2015   07/01/14 0625  cefoTEtan in Dextrose 5% (CEFOTAN) IVPB 2 g  Status:  Discontinued     2 g Intravenous Every 12 hours 07/01/14 0625 07/01/14 1413      Assessment Principal Problem:  1. Sigmoid diverticulitis-recurrent s/p lap assisted subtotal colectomy  2. Post op E. Coli intraabdominal abscess- organism sensitive only to Gentamicin and Primaxin; on InVanz; CT yesterday shows significant decrease in size of abscess; WBC up and platelet count up suggesting a continued inflammatory process; with some elevation of drain amylase, pain with eating and proximity of abscess to pancreas, he may have some pancreatitis.   3.  PC Malnutrition- on solid diet     LOS: 16 days   Plan: Continue InVanz.  Decrease diet to clear liquids.  Check serum lipase and amylase and well as drain amylase.  If findings are all suggestive of some pancreatitis, may need to go back to bowel rest and TPN.  Trinika Cortese J 07/17/2014

## 2014-07-17 NOTE — Progress Notes (Signed)
*  PRELIMINARY RESULTS* Vascular Ultrasound Lower extremity venous duplex has been completed.  Preliminary findings: no evidence of DVT  Farrel DemarkJill Eunice, RDMS, RVT  07/17/2014, 1:49 PM

## 2014-07-17 NOTE — Progress Notes (Signed)
Has had some bilious vomiting today.  Bowels moving and passing gas.  LE venous study negative for DVT.  Repeat C. Diff negative.  Amylase in serum and drain is normal, lipase in serum is normal. Spoke with family about difficulty with treating the resistant E. Coli.  Will try Primaxin instead of InVanz.  Restart TPN.  Order abdominal x-rays.  Make NPO except for sips of water and ice chips.

## 2014-07-17 NOTE — Progress Notes (Signed)
Pharmacy Consult Note - Restarting TNA 10/9  A/P: Orders acknowledged to restart TNA. Will need to recheck labs at this point since has not had in a few days. Will re-order labs for tomorrow AM and restart TNA tomorrow after labs resulted.  Hessie KnowsJustin M Jaliya Siegmann, PharmD, BCPS Pager [phone] 07/17/2014 6:04 PM

## 2014-07-17 NOTE — Progress Notes (Signed)
[facility]  Day # 4 of Invanz  Subjective: More nausea and loose stools    Antibiotics:  Anti-infectives   Start     Dose/Rate Route Frequency Ordered Stop   07/17/14 1800  imipenem-cilastatin (PRIMAXIN) 500 mg in sodium chloride 0.9 % 100 mL IVPB     500 mg 200 mL/hr over 30 Minutes Intravenous 4 times per day 07/17/14 1743     07/14/14 1600  ertapenem (INVANZ) 1 g in sodium chloride 0.9 % 50 mL IVPB  Status:  Discontinued     1 g 100 mL/hr over 30 Minutes Intravenous Every 24 hours 07/14/14 1457 07/17/14 1743   07/14/14 0800  piperacillin-tazobactam (ZOSYN) IVPB 3.375 g  Status:  Discontinued     3.375 g 12.5 mL/hr over 240 Minutes Intravenous Every 8 hours 07/14/14 0723 07/14/14 1456   07/08/14 2100  vancomycin (VANCOCIN) IVPB 1000 mg/200 mL premix  Status:  Discontinued     1,000 mg 200 mL/hr over 60 Minutes Intravenous Every 12 hours 07/08/14 1104 07/10/14 0832   07/08/14 0800  vancomycin (VANCOCIN) 2,000 mg in sodium chloride 0.9 % 500 mL IVPB     2,000 mg 250 mL/hr over 120 Minutes Intravenous  Once 07/08/14 0757 07/08/14 1107   07/07/14 1000  piperacillin-tazobactam (ZOSYN) IVPB 3.375 g  Status:  Discontinued     3.375 g 12.5 mL/hr over 240 Minutes Intravenous Every 8 hours 07/07/14 0821 07/10/14 0832   07/01/14 2000  cefoTEtan (CEFOTAN) 2 g in dextrose 5 % 50 mL IVPB     2 g 100 mL/hr over 30 Minutes Intravenous Every 12 hours 07/01/14 1339 07/01/14 2015   07/01/14 0625  cefoTEtan in Dextrose 5% (CEFOTAN) IVPB 2 g  Status:  Discontinued     2 g Intravenous Every 12 hours 07/01/14 0625 07/01/14 1413      Medications: Scheduled Meds: . enoxaparin (LOVENOX) injection  40 mg Subcutaneous Q24H  . feeding supplement (RESOURCE BREEZE)  1 Container Oral TID BM  . imipenem-cilastatin  500 mg Intravenous 4 times per day  . multivitamin with minerals  1 tablet Oral Daily  . pantoprazole (PROTONIX) IV  40 mg Intravenous QHS   Continuous  Infusions:  PRN Meds:.HYDROcodone-acetaminophen, morphine injection, ondansetron (ZOFRAN) IV, ondansetron, promethazine, sodium chloride    Objective: Weight change:   Intake/Output Summary (Last 24 hours) at 07/17/14 1750 Last data filed at 07/17/14 1523  Gross per 24 hour  Intake   3895 ml  Output    109 ml  Net   3786 ml   Blood pressure 118/74, pulse 96, temperature 97.4 F (36.3 C), temperature source Oral, resp. rate 16, height 6' (1.829 m), weight 227 lb 15.3 oz (103.4 kg), SpO2 99.00%. Temp:  [97.4 F (36.3 C)-98.8 F (37.1 C)] 97.4 F (36.3 C) (10/08 1400) Pulse Rate:  [90-96] 96 (10/08 1400) Resp:  [16-18] 16 (10/08 1400) BP: (115-118)/(70-74) 118/74 mmHg (10/08 1400) SpO2:  [97 %-99 %] 99 % (10/08 1400)  Physical Exam: General: Alert and awake, oriented x3, not in any acute distress.  HEENT: anicteric sclera, EOMI,  CVS regular rate, normal r, no murmur rubs or gallops  Chest: clear to auscultation bilaterally, no wheezing, rales or rhonchi  Abdomen: surgical sites are clean, JP drain is in place with more PURULENT material in drain today, diffusely tender ,  Skin: no rashes  Neuro: nonfocal, strength and sensation intact   CBC:  Recent Labs Lab 07/12/14 0508 07/13/14 0430 07/14/14 0500  07/15/14 0558 07/16/14 0830 07/17/14 0500  HGB 9.4* 9.7* 9.7* 10.0* 11.0* 10.7*  HCT 28.7* 29.0* 29.5* 30.1* 34.1* 32.8*  PLT 602* 695* 695* 764* 989* 963*  INR 1.19  --   --   --   --   --   APTT 34  --   --   --   --   --      BMET No results found for this basename: NA, K, CL, CO2, GLUCOSE, BUN, CREATININE, CALCIUM,  in the last 72 hours   Liver Panel  No results found for this basename: PROT, ALBUMIN, AST, ALT, ALKPHOS, BILITOT, BILIDIR, IBILI,  in the last 72 hours     Sedimentation Rate No results found for this basename: ESRSEDRATE,  in the last 72 hours C-Reactive Protein No results found for this basename: CRP,  in the last 72 hours  Micro  Results: Recent Results (from the past 720 hour(s))  CLOSTRIDIUM DIFFICILE BY PCR     Status: None   Collection Time    07/11/14  9:45 AM      Result Value Ref Range Status   C difficile by pcr NEGATIVE  NEGATIVE Final   Comment: Performed at [facility]  CULTURE, ROUTINE-ABSCESS     Status: None   Collection Time    07/12/14  8:53 AM      Result Value Ref Range Status   Specimen Description ABSCESS PERITONEAL   Final   Special Requests Normal   Final   Gram Stain     Final   Value: MODERATE WBC PRESENT, PREDOMINANTLY MONONUCLEAR     FEW WBC PRESENT, PREDOMINANTLY PMN     NO     SQUAMOUS EPITHELIAL CELLS PRESENT     MODERATE GRAM NEGATIVE RODS   Culture     Final   Value: ABUNDANT ESCHERICHIA COLI     Note: Confirmed Extended Spectrum Beta-Lactamase Producer (ESBL) CRITICAL RESULT CALLED TO, READ BACK BY AND VERIFIED WITH: KATHERINE STRUPE 07/14/14 1115 BY SMITHERSJ     Performed at [facility]   Report Status 07/14/2014 FINAL   Final   Organism ID, Bacteria ESCHERICHIA COLI   Final  CLOSTRIDIUM DIFFICILE BY PCR     Status: None   Collection Time    07/17/14  3:32 PM      Result Value Ref Range Status   C difficile by pcr NEGATIVE  NEGATIVE Final   Comment: Performed at [facility]    Studies/Results: Ct Abdomen Pelvis W Contrast  07/16/2014   CLINICAL DATA:  Evaluate for intra-abdominal abscess. Patient has had abscess drainage twice with pain at the incision site.  EXAM: CT ABDOMEN AND PELVIS WITH CONTRAST  TECHNIQUE: Multidetector CT imaging of the abdomen and pelvis was performed using the standard protocol following bolus administration of intravenous contrast.  CONTRAST:  100mL OMNIPAQUE IOHEXOL 300 MG/ML  SOLN  COMPARISON:  July 12, 2014, July 11, 2014  FINDINGS: A pigtail abscess strain is identified in pancreatic fluid collection at the body of the pancreas. The abscess is decreased in size, currently measuring 3.4 x 3.7 cm. No new abscess is  noted. There are dilated small bowel loops throughout the abdomen pelvis probably due to ileus.  There are cysts within the liver unchanged. The spleen, gallbladder, adrenal glands and right kidney are normal. There is a 1 cm cyst in the upper pole left kidney unchanged. There is no hydronephrosis bilaterally. The aorta is normal. There is no abdominal lymphadenopathy.  Patient is status post prior subtotal colectomy. Small portion of rectal sigmoid colon remains. The previously noted left pelvic drain has been removed. Fluid-filled bladder is normal.  The visualized lung bases demonstrate mild atelectasis of the left lung base. No acute abnormalities identified within the visualized bones.  IMPRESSION: Drain is identified within the fluid collection at the pancreatic body. The size of fluid collection at the pancreatic body is decreased currently measuring 3.4 x 3.7 cm.  Persistent small bowel ileus.   Electronically Signed   By: Sherian Rein M.D.   On: 07/16/2014 11:03      Assessment/Plan:  Principal Problem:   Sigmoid diverticulitis-recurrent s/p lap assisted subtotal colectomy    [name] [name] is a 48 y.o. male with Complicated diverticulitis sp LAP total colectomy, then found to have intraabdominal abscess with ESBL   #1 Intraabdominal abscess with ESBL: His WBC has risen again as has his platelets which might suggest signficant inflammatory process still at work  CDiff PCR negative, Dopplers LE negative.   Surgery considering possibility of pancreatitis though serum levels normal   His CT scan lungs only with atelectasis  His abscess is smaller on repeat CT yesterday   For now I would continue the [facility]  I do not see any benefit to broading beyond this without another source of infection being ID'd   #2 Screening: will screen him for HIV and Hep panel is negative    LOS: 16 days   Acey Lav 07/17/2014, 5:50 PM

## 2014-07-17 NOTE — Progress Notes (Signed)
Dr. Abbey Chattersosenbower aware via phone mom and dad of pt wanted to speak to doctor regarding pt status. MD spoke with family and pt via speaker phone addressing their concerns.

## 2014-07-18 ENCOUNTER — Inpatient Hospital Stay (HOSPITAL_COMMUNITY): Payer: BC Managed Care – PPO

## 2014-07-18 DIAGNOSIS — I8290 Acute embolism and thrombosis of unspecified vein: Secondary | ICD-10-CM

## 2014-07-18 DIAGNOSIS — R609 Edema, unspecified: Secondary | ICD-10-CM

## 2014-07-18 LAB — CBC
HEMATOCRIT: 31.1 % — AB (ref 39.0–52.0)
Hemoglobin: 10.5 g/dL — ABNORMAL LOW (ref 13.0–17.0)
MCH: 29.7 pg (ref 26.0–34.0)
MCHC: 33.8 g/dL (ref 30.0–36.0)
MCV: 88.1 fL (ref 78.0–100.0)
Platelets: 859 10*3/uL — ABNORMAL HIGH (ref 150–400)
RBC: 3.53 MIL/uL — ABNORMAL LOW (ref 4.22–5.81)
RDW: 13.8 % (ref 11.5–15.5)
WBC: 27.1 10*3/uL — ABNORMAL HIGH (ref 4.0–10.5)

## 2014-07-18 LAB — PREALBUMIN: Prealbumin: 14.7 mg/dL — ABNORMAL LOW (ref 17.0–34.0)

## 2014-07-18 LAB — LACTIC ACID, PLASMA: Lactic Acid, Venous: 0.9 mmol/L (ref 0.5–2.2)

## 2014-07-18 LAB — BASIC METABOLIC PANEL
ANION GAP: 15 (ref 5–15)
BUN: 25 mg/dL — AB (ref 6–23)
CALCIUM: 9.1 mg/dL (ref 8.4–10.5)
CHLORIDE: 97 meq/L (ref 96–112)
CO2: 22 meq/L (ref 19–32)
CREATININE: 1.31 mg/dL (ref 0.50–1.35)
GFR calc Af Amer: 74 mL/min — ABNORMAL LOW (ref >90)
GFR calc non Af Amer: 64 mL/min — ABNORMAL LOW (ref >90)
Glucose, Bld: 145 mg/dL — ABNORMAL HIGH (ref 70–99)
Potassium: 5 mEq/L (ref 3.7–5.3)
Sodium: 134 mEq/L — ABNORMAL LOW (ref 137–147)

## 2014-07-18 LAB — MAGNESIUM: Magnesium: 2.3 mg/dL (ref 1.5–2.5)

## 2014-07-18 LAB — COMPREHENSIVE METABOLIC PANEL
ALK PHOS: 101 U/L (ref 39–117)
ALT: 24 U/L (ref 0–53)
ANION GAP: 16 — AB (ref 5–15)
AST: 17 U/L (ref 0–37)
Albumin: 2.7 g/dL — ABNORMAL LOW (ref 3.5–5.2)
BUN: 28 mg/dL — AB (ref 6–23)
CHLORIDE: 95 meq/L — AB (ref 96–112)
CO2: 22 meq/L (ref 19–32)
Calcium: 9.3 mg/dL (ref 8.4–10.5)
Creatinine, Ser: 1.37 mg/dL — ABNORMAL HIGH (ref 0.50–1.35)
GFR, EST AFRICAN AMERICAN: 70 mL/min — AB (ref >90)
GFR, EST NON AFRICAN AMERICAN: 60 mL/min — AB (ref >90)
GLUCOSE: 135 mg/dL — AB (ref 70–99)
POTASSIUM: 4.8 meq/L (ref 3.7–5.3)
Sodium: 133 mEq/L — ABNORMAL LOW (ref 137–147)
Total Bilirubin: 0.5 mg/dL (ref 0.3–1.2)
Total Protein: 7.8 g/dL (ref 6.0–8.3)

## 2014-07-18 LAB — GLUCOSE, CAPILLARY
GLUCOSE-CAPILLARY: 138 mg/dL — AB (ref 70–99)
Glucose-Capillary: 123 mg/dL — ABNORMAL HIGH (ref 70–99)
Glucose-Capillary: 130 mg/dL — ABNORMAL HIGH (ref 70–99)

## 2014-07-18 LAB — PHOSPHORUS
Phosphorus: 5 mg/dL — ABNORMAL HIGH (ref 2.3–4.6)
Phosphorus: 3.5 mg/dL (ref 2.3–4.6)

## 2014-07-18 LAB — TRIGLYCERIDES: Triglycerides: 84 mg/dL (ref <150)

## 2014-07-18 MED ORDER — SODIUM CHLORIDE 0.9 % IV BOLUS (SEPSIS)
1000.0000 mL | Freq: Once | INTRAVENOUS | Status: AC
  Administered 2014-07-18: 1000 mL via INTRAVENOUS

## 2014-07-18 MED ORDER — SODIUM CHLORIDE 0.9 % IV SOLN
INTRAVENOUS | Status: DC
  Administered 2014-07-18 – 2014-07-27 (×6): via INTRAVENOUS

## 2014-07-18 MED ORDER — DEXTROSE-NACL 5-0.9 % IV SOLN
INTRAVENOUS | Status: AC
  Administered 2014-07-18: 14:00:00 via INTRAVENOUS

## 2014-07-18 MED ORDER — SODIUM CHLORIDE 0.45 % IV SOLN
INTRAVENOUS | Status: DC
  Filled 2014-07-18: qty 1000

## 2014-07-18 MED ORDER — MENTHOL 3 MG MT LOZG
1.0000 | LOZENGE | OROMUCOSAL | Status: DC | PRN
  Filled 2014-07-18 (×2): qty 9

## 2014-07-18 MED ORDER — TRACE MINERALS CR-CU-F-FE-I-MN-MO-SE-ZN IV SOLN
INTRAVENOUS | Status: AC
  Administered 2014-07-18: 17:00:00 via INTRAVENOUS
  Filled 2014-07-18: qty 2000

## 2014-07-18 MED ORDER — INSULIN ASPART 100 UNIT/ML ~~LOC~~ SOLN
0.0000 [IU] | Freq: Four times a day (QID) | SUBCUTANEOUS | Status: DC
  Administered 2014-07-18: 1 [IU] via SUBCUTANEOUS
  Administered 2014-07-19: 2 [IU] via SUBCUTANEOUS
  Administered 2014-07-20: 1 [IU] via SUBCUTANEOUS
  Administered 2014-07-20: 3 [IU] via SUBCUTANEOUS
  Administered 2014-07-21: 2 [IU] via SUBCUTANEOUS

## 2014-07-18 MED ORDER — LINEZOLID 2 MG/ML IV SOLN
600.0000 mg | Freq: Two times a day (BID) | INTRAVENOUS | Status: DC
  Administered 2014-07-18 – 2014-07-25 (×15): 600 mg via INTRAVENOUS
  Filled 2014-07-18 (×17): qty 300

## 2014-07-18 MED ORDER — FAT EMULSION 20 % IV EMUL
250.0000 mL | INTRAVENOUS | Status: AC
  Administered 2014-07-18: 250 mL via INTRAVENOUS
  Filled 2014-07-18: qty 250

## 2014-07-18 MED ORDER — PHENOL 1.4 % MT LIQD
1.0000 | OROMUCOSAL | Status: DC | PRN
  Filled 2014-07-18: qty 177

## 2014-07-18 MED ORDER — SODIUM CHLORIDE 0.9 % IV SOLN
150.0000 mg | INTRAVENOUS | Status: DC
Start: 2014-07-18 — End: 2014-07-25
  Administered 2014-07-18 – 2014-07-25 (×8): 150 mg via INTRAVENOUS
  Filled 2014-07-18 (×9): qty 150

## 2014-07-18 NOTE — Progress Notes (Signed)
His wife was in the room.  We reviewed his operative and hospital course in detail.  BUN and Cr are better.  Ng tube is in and has drained 500 cc of bilious fluid.  His nausea is better and he is less distended.  His drain about was fairly clear this AM, but after ng placement it has become purulent appearing again.  He is on very broad spectrum abxs.  No DVT on UE venous duplex.  Will check abdominal x-rays tomorrow and monitor culture results.  I will be out of town and my partners will cover.

## 2014-07-18 NOTE — Progress Notes (Signed)
Referring Physician(s): CCS  Subjective: Patient still with nausea and loose BM.   Allergies: Celebrex; Sulfa antibiotics; Codeine; and Dilaudid  Medications: Prior to Admission medications   Medication Sig Start Date End Date Taking? Authorizing Provider  ciprofloxacin (CIPRO) 500 MG tablet Take 1 tablet (500 mg total) by mouth 2 (two) times daily. 06/18/14  Yes Enid SkeensJoshua M Zavitz, MD  diphenhydrAMINE (BENADRYL) 25 MG tablet Take 25 mg by mouth every 6 (six) hours as needed for allergies.    Yes Historical Provider, MD  hyoscyamine (LEVSIN SL) 0.125 MG SL tablet Place 0.125 mg under the tongue every 6 (six) hours as needed for cramping.   Yes Historical Provider, MD  ondansetron (ZOFRAN-ODT) 4 MG disintegrating tablet Take 4 mg by mouth every 4 (four) hours as needed for nausea or vomiting.   Yes Historical Provider, MD  oxyCODONE-acetaminophen (PERCOCET/ROXICET) 5-325 MG per tablet Take 2 tablets by mouth every 4 (four) hours as needed for severe pain. 03/24/14  Yes Jillyn LedgerJessica K Palmer, PA-C  oxyCODONE-acetaminophen (PERCOCET/ROXICET) 5-325 MG per tablet Take 1 tablet by mouth every 4 (four) hours as needed for moderate pain or severe pain.   Yes Historical Provider, MD  zolpidem (AMBIEN) 10 MG tablet Take 10 mg by mouth at bedtime as needed for sleep.    Yes Historical Provider, MD  colchicine 0.6 MG tablet Take 0.6 mg by mouth 2 (two) times daily as needed (For gout.).  06/04/14   Historical Provider, MD  losartan-hydrochlorothiazide (HYZAAR) 50-12.5 MG per tablet Take 0.5 tablets by mouth at bedtime.  11/18/13   Historical Provider, MD  metroNIDAZOLE (FLAGYL) 500 MG tablet Take 1 tablet (500 mg total) by mouth 3 (three) times daily. 06/18/14   Enid SkeensJoshua M Zavitz, MD  Probiotic Product (PROBIOTIC DAILY PO) Take 1 capsule by mouth at bedtime.    Historical Provider, MD    Review of Systems  Vital Signs: BP 118/76  Pulse 77  Temp(Src) 98.2 F (36.8 C) (Oral)  Resp 18  Ht 6' (1.829 m)  Wt 227  lb 15.3 oz (103.4 kg)  BMI 30.91 kg/m2  SpO2 99%  Physical Exam General: A&Ox3, lying bed, NGT to suction.  Abd: Distended, left RP drain intact with minimal output in bulb, 10 cc last 24 hours recorded.   Imaging: Ct Abdomen Pelvis W Contrast  07/16/2014   CLINICAL DATA:  Evaluate for intra-abdominal abscess. Patient has had abscess drainage twice with pain at the incision site.  EXAM: CT ABDOMEN AND PELVIS WITH CONTRAST  TECHNIQUE: Multidetector CT imaging of the abdomen and pelvis was performed using the standard protocol following bolus administration of intravenous contrast.  CONTRAST:  100mL OMNIPAQUE IOHEXOL 300 MG/ML  SOLN  COMPARISON:  July 12, 2014, July 11, 2014  FINDINGS: A pigtail abscess strain is identified in pancreatic fluid collection at the body of the pancreas. The abscess is decreased in size, currently measuring 3.4 x 3.7 cm. No new abscess is noted. There are dilated small bowel loops throughout the abdomen pelvis probably due to ileus.  There are cysts within the liver unchanged. The spleen, gallbladder, adrenal glands and right kidney are normal. There is a 1 cm cyst in the upper pole left kidney unchanged. There is no hydronephrosis bilaterally. The aorta is normal. There is no abdominal lymphadenopathy. Patient is status post prior subtotal colectomy. Small portion of rectal sigmoid colon remains. The previously noted left pelvic drain has been removed. Fluid-filled bladder is normal.  The visualized lung bases demonstrate mild atelectasis  of the left lung base. No acute abnormalities identified within the visualized bones.  IMPRESSION: Drain is identified within the fluid collection at the pancreatic body. The size of fluid collection at the pancreatic body is decreased currently measuring 3.4 x 3.7 cm.  Persistent small bowel ileus.   Electronically Signed   By: Sherian ReinWei-Chen  Lin M.D.   On: 07/16/2014 11:03   Dg Abd Acute W/chest  07/17/2014   CLINICAL DATA:  Nausea  vomiting. History of subtotal colectomy 2 weeks ago. Abdominal pain with nausea and vomiting. Per epic notes, the patient having bowel movements and passing gas.  EXAM: ACUTE ABDOMEN SERIES (ABDOMEN 2 VIEW & CHEST 1 VIEW)  COMPARISON:  CT abdomen pelvis 07/17/1999  FINDINGS: Right upper extremity PICC terminates at the junction of the superior vena cava and right atrium. Heart and mediastinal contours are normal. Lung volumes are slightly low with mild left basilar atelectasis. Negative for airspace disease or pleural effusion.  Pigtail drainage catheter projects over the left upper quadrant.  Diffuse gaseous distention of small bowel loops throughout the abdomen. Gas is seen in the rectum. Small bowel loops measure up to 5 cm diameter. There are multiple air-fluid levels within small bowel loops on the upright view. Negative for free intraperitoneal air.  IMPRESSION: Diffuse gaseous distention of small bowel loops with multiple air-fluid levels (small bowel loops measure up to 5 cm). Gas is seen in the rectum. Findings are favored to be due to ileus, given the clinical history of the patient passing gas and having bowel movements.  No free intraperitoneal air.  Minimal left basilar atelectasis.   Electronically Signed   By: Britta MccreedySusan  Turner M.D.   On: 07/17/2014 19:04    Labs:  CBC:  Recent Labs  07/15/14 0558 07/16/14 0830 07/17/14 0500 07/18/14 0430  WBC 15.7* 20.9* 24.4* 27.1*  HGB 10.0* 11.0* 10.7* 10.5*  HCT 30.1* 34.1* 32.8* 31.1*  PLT 764* 989* 963* 859*    COAGS:  Recent Labs  06/25/14 1445 07/12/14 0508  INR 1.08 1.19  APTT  --  34    BMP:  Recent Labs  07/13/14 0430 07/14/14 0500 07/18/14 0430 07/18/14 1244  NA 135* 134* 133* 134*  K 4.4 4.5 4.8 5.0  CL 102 99 95* 97  CO2 20 21 22 22   GLUCOSE 144* 138* 135* 145*  BUN 16 17 28* 25*  CALCIUM 8.6 8.8 9.3 9.1  CREATININE 1.21 1.22 1.37* 1.31  GFRNONAA 70* 70* 60* 64*  GFRAA 81* 81* 70* 74*    LIVER FUNCTION  TESTS:  Recent Labs  07/09/14 0517 07/10/14 0535 07/14/14 0500 07/18/14 0430  BILITOT 0.4 0.5 0.4 0.5  AST 43* 32 20 17  ALT 71* 65* 37 24  ALKPHOS 65 73 86 101  PROT 5.7* 6.3 6.9 7.8  ALBUMIN 2.2* 2.4* 2.5* 2.7*    Assessment and Plan: Recurrent sigmoid diverticulitis s/p subtotal colectomy  Post operative left RP abscess s/p drainage 10/3, switched to JP suction 10/5 Cx E. Coli ESBL, ID on board CT 10/7 shows decreased size of drained abscess, no additional abscesses Persistent SB ileus Leukocytosis upward trend? Await BCx, and repeat abscess Cx, C. Diff negative Plans per ID/CCS    I spent a total of 15 minutes face to face in clinical consultation/evaluation, greater than 50% of which was counseling/coordinating care   Signed: Berneta LevinsMORGAN, Bani Gianfrancesco D 07/18/2014, 2:00 PM

## 2014-07-18 NOTE — Progress Notes (Signed)
PARENTERAL NUTRITION CONSULT NOTE  Pharmacy Consult for TNA Indication: Prolonged postoperative ileus  Allergies  Allergen Reactions  . Celebrex [Celecoxib]     Mother is allergic, so I do not take it  . Sulfa Antibiotics Nausea And Vomiting  . Codeine Rash  . Dilaudid [Hydromorphone Hcl] Nausea And Vomiting and Anxiety   Patient Measurements: Height: 6' (182.9 cm) Weight: 227 lb 15.3 oz (103.4 kg) IBW/kg (Calculated) : 77.6  Vital Signs: Temp: 98.2 F (36.8 C) (10/09 0546) Temp Source: Oral (10/09 0546) BP: 118/76 mmHg (10/09 0546) Pulse Rate: 77 (10/09 0546) Intake/Output from previous day: 10/08 0701 - 10/09 0700 In: 1847.5 [P.O.:480; I.V.:1362.5] Out: 110 [Drains:10; Stool:100] Intake/Output from this shift:   Labs:  Recent Labs  07/16/14 0830 07/17/14 0500 07/18/14 0430  WBC 20.9* 24.4* 27.1*  HGB 11.0* 10.7* 10.5*  HCT 34.1* 32.8* 31.1*  PLT 989* 963* 859*    Recent Labs  07/18/14 0430  NA 133*  K 4.8  CL 95*  CO2 22  GLUCOSE 135*  BUN 28*  CREATININE 1.37*  CALCIUM 9.3  MG 2.3  PHOS 5.0*  PROT 7.8  ALBUMIN 2.7*  AST 17  ALT 24  ALKPHOS 101  BILITOT 0.5  TRIG 84  corrected Ca 9.7 Estimated Creatinine Clearance: 83.8 ml/min (by C-G formula based on Cr of 1.37).    Recent Labs  07/17/14 1754 07/17/14 2322 07/18/14 0618  GLUCAP 139* 130* 123*   Insulin Requirements in past 24 hrs: SSI stopped following d/c of TPN 10/6 (no hx of DM)  Current Nutrition:  Back to NPO 10/8  IVF: D5W 0.45% + 30meq KCl at 150 ml/hr  Central access: PICC placed 9/29 TPN start date: 9/29  ASSESSMENT                                                                                                          HPI:  [id number] yoM with recurrent sigmoid diverticulitis. S/p lap assisted subtotal colectomy on 9/22. Patient has developed postop ileus. TPN was stopped 10/7 as tolerating enteral diet, but orders to resume TPN 10/9 following recurrent ileus on 10/8.   Pharmacy to assist in TPN therapy.   Significant events:  9/29: Had some crampy upper abdominal pain then nausea and vomiting-basically everything he ate yesterday. 10/2: Passing flatus, multiple loose BMs - C.diff pending.  Mild dyspnea when walking - for CT chest.  Leukocytosis persists - for repeat CT of abdomen. 10/3: 2nd drain placed in IR for abd fluid collection  10/4: 165 ml out drain placed 10/3, total 45 ml out drain placed 11 days ago 10/5: Invanz started for post op ESBL E.coli intraabdominal abscess. 10/6: tolerating FLD, advancing to soft diet. TPN wean 10/7: TPN stopped 10/9: resume TPN for N/V, abd distention (recurrent ileus), place NGT to suction  Today 10/9:  Glucose - controlled, CBGs < 150  Electrolytes - Na remain low despite off TPN, Phos elevated at 5. corr Ca = 10.34 (Phos x Corr Ca ratio = 52)  Repeat labs reveal Phos = 3.5, K =  5 (KCl removed from MIVF)  Renal -SCr 1.37, trending up last 24h, no strict I/O, minimal drain output  LFTs - WNL  TGs - WNL (9/30, 10/5, 10/8)  Prealbumin - 13.9 (9/30),  14.4 (10/5),  IP (10/9)  NUTRITIONAL GOALS                                                                                    RD recs: 1950-2250 Kcal, 120-140g Protein, 1.9-2.2 L/day fluid Clinimix E 5/15 at a goal rate of 15800ml/hr + 20% fat emulsion at ml/hr to provide: 120 g/day protein, 2184 Kcal/day.  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  Advance to goal as tolerates  Due to N/V, add standard multivitamins and trace elements to TPN  (Currently ordered PO MV)  Remove D5W from MIVF and Reduce MIVF rate at 6pm to 6190ml/hr (adjust for TPN rate)  Resume sensitive scale SSI q6h (CBGs already ordered) .   TNA lab panels on Mondays & Thursdays.  Labs in am  Juliette Alcideustin  Tyrihanna Wingert, PharmD, BCPS.   Pager: [phone] 07/18/2014 8:18 AM

## 2014-07-18 NOTE — Progress Notes (Signed)
Agree.  Will continue to follow drain output. 

## 2014-07-18 NOTE — Progress Notes (Signed)
17 Days Post-Op  Subjective: Feels bloated and nauseated all the time.  Had two BMs with gas overnight.  Some dry heaving.   Objective: Vital signs in last 24 hours: Temp:  [97.4 F (36.3 C)-99.2 F (37.3 C)] 98.2 F (36.8 C) (10/09 0546) Pulse Rate:  [77-96] 77 (10/09 0546) Resp:  [16-18] 18 (10/09 0546) BP: (118-123)/(74-76) 118/76 mmHg (10/09 0546) SpO2:  [91 %-99 %] 99 % (10/09 0546) Last BM Date: 07/17/14  Intake/Output from previous day: 10/08 0701 - 10/09 0700 In: 1847.5 [P.O.:480; I.V.:1362.5] Out: 110 [Drains:10; Stool:100] Intake/Output this shift: Total I/O In: 1362.5 [I.V.:1362.5] Out: 5 [Drains:5]  PE: General- In NAD Abdomen-slightly firm and distended, hypoactive bowel sounds now, incisions are clean and intact, clear drain output Lab Results:   Recent Labs  07/17/14 0500 07/18/14 0430  WBC 24.4* 27.1*  HGB 10.7* 10.5*  HCT 32.8* 31.1*  PLT 963* 859*   BMET  Recent Labs  07/18/14 0430  NA 133*  K 4.8  CL 95*  CO2 22  GLUCOSE 135*  BUN 28*  CREATININE 1.37*  CALCIUM 9.3   PT/INR No results found for this basename: LABPROT, INR,  in the last 72 hours Comprehensive Metabolic Panel:    Component Value Date/Time   NA 133* 07/18/2014 0430   NA 134* 07/14/2014 0500   K 4.8 07/18/2014 0430   K 4.5 07/14/2014 0500   CL 95* 07/18/2014 0430   CL 99 07/14/2014 0500   CO2 22 07/18/2014 0430   CO2 21 07/14/2014 0500   BUN 28* 07/18/2014 0430   BUN 17 07/14/2014 0500   CREATININE 1.37* 07/18/2014 0430   CREATININE 1.22 07/14/2014 0500   GLUCOSE 135* 07/18/2014 0430   GLUCOSE 138* 07/14/2014 0500   CALCIUM 9.3 07/18/2014 0430   CALCIUM 8.8 07/14/2014 0500   AST 17 07/18/2014 0430   AST 20 07/14/2014 0500   ALT 24 07/18/2014 0430   ALT 37 07/14/2014 0500   ALKPHOS 101 07/18/2014 0430   ALKPHOS 86 07/14/2014 0500   BILITOT 0.5 07/18/2014 0430   BILITOT 0.4 07/14/2014 0500   PROT 7.8 07/18/2014 0430   PROT 6.9 07/14/2014 0500   ALBUMIN 2.7* 07/18/2014 0430   ALBUMIN 2.5* 07/14/2014 0500     Studies/Results: Ct Abdomen Pelvis W Contrast  07/16/2014   CLINICAL DATA:  Evaluate for intra-abdominal abscess. Patient has had abscess drainage twice with pain at the incision site.  EXAM: CT ABDOMEN AND PELVIS WITH CONTRAST  TECHNIQUE: Multidetector CT imaging of the abdomen and pelvis was performed using the standard protocol following bolus administration of intravenous contrast.  CONTRAST:  100mL OMNIPAQUE IOHEXOL 300 MG/ML  SOLN  COMPARISON:  July 12, 2014, July 11, 2014  FINDINGS: A pigtail abscess strain is identified in pancreatic fluid collection at the body of the pancreas. The abscess is decreased in size, currently measuring 3.4 x 3.7 cm. No new abscess is noted. There are dilated small bowel loops throughout the abdomen pelvis probably due to ileus.  There are cysts within the liver unchanged. The spleen, gallbladder, adrenal glands and right kidney are normal. There is a 1 cm cyst in the upper pole left kidney unchanged. There is no hydronephrosis bilaterally. The aorta is normal. There is no abdominal lymphadenopathy. Patient is status post prior subtotal colectomy. Small portion of rectal sigmoid colon remains. The previously noted left pelvic drain has been removed. Fluid-filled bladder is normal.  The visualized lung bases demonstrate mild atelectasis of the left lung base. No  acute abnormalities identified within the visualized bones.  IMPRESSION: Drain is identified within the fluid collection at the pancreatic body. The size of fluid collection at the pancreatic body is decreased currently measuring 3.4 x 3.7 cm.  Persistent small bowel ileus.   Electronically Signed   By: Sherian Rein M.D.   On: 07/16/2014 11:03   Dg Abd Acute W/chest  07/17/2014   CLINICAL DATA:  Nausea vomiting. History of subtotal colectomy 2 weeks ago. Abdominal pain with nausea and vomiting. Per epic notes, the patient having bowel movements and passing gas.  EXAM: ACUTE  ABDOMEN SERIES (ABDOMEN 2 VIEW & CHEST 1 VIEW)  COMPARISON:  CT abdomen pelvis 07/17/1999  FINDINGS: Right upper extremity PICC terminates at the junction of the superior vena cava and right atrium. Heart and mediastinal contours are normal. Lung volumes are slightly low with mild left basilar atelectasis. Negative for airspace disease or pleural effusion.  Pigtail drainage catheter projects over the left upper quadrant.  Diffuse gaseous distention of small bowel loops throughout the abdomen. Gas is seen in the rectum. Small bowel loops measure up to 5 cm diameter. There are multiple air-fluid levels within small bowel loops on the upright view. Negative for free intraperitoneal air.  IMPRESSION: Diffuse gaseous distention of small bowel loops with multiple air-fluid levels (small bowel loops measure up to 5 cm). Gas is seen in the rectum. Findings are favored to be due to ileus, given the clinical history of the patient passing gas and having bowel movements.  No free intraperitoneal air.  Minimal left basilar atelectasis.   Electronically Signed   By: Britta Mccreedy M.D.   On: 07/17/2014 19:04    Anti-infectives: Anti-infectives   Start     Dose/Rate Route Frequency Ordered Stop   07/17/14 2000  imipenem-cilastatin (PRIMAXIN) 500 mg in sodium chloride 0.9 % 100 mL IVPB     500 mg 200 mL/hr over 30 Minutes Intravenous Every 6 hours 07/17/14 1743     07/14/14 1600  ertapenem (INVANZ) 1 g in sodium chloride 0.9 % 50 mL IVPB  Status:  Discontinued     1 g 100 mL/hr over 30 Minutes Intravenous Every 24 hours 07/14/14 1457 07/17/14 1743   07/14/14 0800  piperacillin-tazobactam (ZOSYN) IVPB 3.375 g  Status:  Discontinued     3.375 g 12.5 mL/hr over 240 Minutes Intravenous Every 8 hours 07/14/14 0723 07/14/14 1456   07/08/14 2100  vancomycin (VANCOCIN) IVPB 1000 mg/200 mL premix  Status:  Discontinued     1,000 mg 200 mL/hr over 60 Minutes Intravenous Every 12 hours 07/08/14 1104 07/10/14 0832   07/08/14  0800  vancomycin (VANCOCIN) 2,000 mg in sodium chloride 0.9 % 500 mL IVPB     2,000 mg 250 mL/hr over 120 Minutes Intravenous  Once 07/08/14 0757 07/08/14 1107   07/07/14 1000  piperacillin-tazobactam (ZOSYN) IVPB 3.375 g  Status:  Discontinued     3.375 g 12.5 mL/hr over 240 Minutes Intravenous Every 8 hours 07/07/14 0821 07/10/14 0832   07/01/14 2000  cefoTEtan (CEFOTAN) 2 g in dextrose 5 % 50 mL IVPB     2 g 100 mL/hr over 30 Minutes Intravenous Every 12 hours 07/01/14 1339 07/01/14 2015   07/01/14 0625  cefoTEtan in Dextrose 5% (CEFOTAN) IVPB 2 g  Status:  Discontinued     2 g Intravenous Every 12 hours 07/01/14 0625 07/01/14 1413      Assessment Principal Problem:  1. Sigmoid diverticulitis-recurrent s/p lap assisted subtotal colectomy; ileus  has returned  2.  ID:   Post op E. Coli intraabdominal abscess- switched to Primaxin; WBC still rising suggesting that there may be another organism involved not picked up on culture or from another source( ? PICC, urine); C. Diff negative twice  3.  PC Malnutrition- TPN reordered  4.  Volume depletion from vomiting and loose BMs     LOS: 17 days   Plan:  Volume resuscitation.  Culture blood-peripheral and PICC, urine, and drain fluid.  Insert ng tube to LWS.  Repeat BMET later today.  Talk with ID about broadening antibiotic coverage.  Jaquavious Mercer J 07/18/2014

## 2014-07-18 NOTE — Progress Notes (Signed)
[facility]  Day # 1 imipenem  4 day of Invanz  Subjective: Now with NG, ileus having been seen on [facility]   Antibiotics:  Anti-infectives   Start     Dose/Rate Route Frequency Ordered Stop   07/18/14 1100  micafungin (MYCAMINE) 150 mg in sodium chloride 0.9 % 100 mL IVPB     150 mg 100 mL/hr over 1 Hours Intravenous Every 24 hours 07/18/14 0907     07/18/14 1000  linezolid (ZYVOX) IVPB 600 mg     600 mg 300 mL/hr over 60 Minutes Intravenous Every 12 hours 07/18/14 0907     07/17/14 2000  imipenem-cilastatin (PRIMAXIN) 500 mg in sodium chloride 0.9 % 100 mL IVPB     500 mg 200 mL/hr over 30 Minutes Intravenous Every 6 hours 07/17/14 1743     07/14/14 1600  ertapenem (INVANZ) 1 g in sodium chloride 0.9 % 50 mL IVPB  Status:  Discontinued     1 g 100 mL/hr over 30 Minutes Intravenous Every 24 hours 07/14/14 1457 07/17/14 1743   07/14/14 0800  piperacillin-tazobactam (ZOSYN) IVPB 3.375 g  Status:  Discontinued     3.375 g 12.5 mL/hr over 240 Minutes Intravenous Every 8 hours 07/14/14 0723 07/14/14 1456   07/08/14 2100  vancomycin (VANCOCIN) IVPB 1000 mg/200 mL premix  Status:  Discontinued     1,000 mg 200 mL/hr over 60 Minutes Intravenous Every 12 hours 07/08/14 1104 07/10/14 0832   07/08/14 0800  vancomycin (VANCOCIN) 2,000 mg in sodium chloride 0.9 % 500 mL IVPB     2,000 mg 250 mL/hr over 120 Minutes Intravenous  Once 07/08/14 0757 07/08/14 1107   07/07/14 1000  piperacillin-tazobactam (ZOSYN) IVPB 3.375 g  Status:  Discontinued     3.375 g 12.5 mL/hr over 240 Minutes Intravenous Every 8 hours 07/07/14 0821 07/10/14 0832   07/01/14 2000  cefoTEtan (CEFOTAN) 2 g in dextrose 5 % 50 mL IVPB     2 g 100 mL/hr over 30 Minutes Intravenous Every 12 hours 07/01/14 1339 07/01/14 2015   07/01/14 0625  cefoTEtan in Dextrose 5% (CEFOTAN) IVPB 2 g  Status:  Discontinued     2 g Intravenous Every 12 hours 07/01/14 0625 07/01/14 1413       Medications: Scheduled Meds: . enoxaparin (LOVENOX) injection  40 mg Subcutaneous Q24H  . imipenem-cilastatin  500 mg Intravenous Q6H  . insulin aspart  0-9 Units Subcutaneous 4 times per day  . linezolid  600 mg Intravenous Q12H  . micafungin (MYCAMINE) IV  150 mg Intravenous Q24H  . multivitamin with minerals  1 tablet Oral Daily  . pantoprazole (PROTONIX) IV  40 mg Intravenous QHS   Continuous Infusions: . sodium chloride    . dextrose 5 % and 0.9% NaCl 150 mL/hr at 07/18/14 1347  . Marland Kitchen.TPN (CLINIMIX-E) Adult 60 mL/hr at 07/18/14 1713   And  . fat emulsion 250 mL (07/18/14 1713)   PRN Meds:.HYDROcodone-acetaminophen, menthol-cetylpyridinium, morphine injection, ondansetron (ZOFRAN) IV, ondansetron, phenol, promethazine, sodium chloride    Objective: Weight change:   Intake/Output Summary (Last 24 hours) at 07/18/14 1718 Last data filed at 07/18/14 1400  Gross per 24 hour  Intake 1367.5 ml  Output    465 ml  Net  902.5 ml   Blood pressure 134/77, pulse 80, temperature 98.6 F (37 C), temperature source Oral, resp. rate 18, height 6' (1.829 m), weight 227 lb 15.3 oz (103.4 kg), SpO2 99.00%. Temp:  [98.2 F (  36.8 C)-99.2 F (37.3 C)] 98.6 F (37 C) (10/09 1400) Pulse Rate:  [77-89] 80 (10/09 1400) Resp:  [18] 18 (10/09 1400) BP: (118-134)/(75-77) 134/77 mmHg (10/09 1400) SpO2:  [91 %-99 %] 99 % (10/09 1400)  Physical Exam: General: Alert and awake, oriented x3, w NG tube HEENT: anicteric sclera, EOMI,  CVS regular rate, normal r, no murmur rubs or gallops  Chest: clear to auscultation bilaterally, no wheezing, rales or rhonchi  Abdomen: surgical sites are clean, JP drain is in place with more PURULENT material in drain today, diffusely tender ,  Skin: no rashes  Neuro: nonfocal, strength and sensation intact   CBC:  Recent Labs Lab 07/12/14 0508  07/14/14 0500 07/15/14 0558 07/16/14 0830 07/17/14 0500 07/18/14 0430  HGB 9.4*  < > 9.7* 10.0* 11.0* 10.7*  10.5*  HCT 28.7*  < > 29.5* 30.1* 34.1* 32.8* 31.1*  PLT 602*  < > 695* 764* 989* 963* 859*  INR 1.19  --   --   --   --   --   --   APTT 34  --   --   --   --   --   --   < > = values in this interval not displayed.   BMET  Recent Labs  07/18/14 0430 07/18/14 1244  NA 133* 134*  K 4.8 5.0  CL 95* 97  CO2 22 22  GLUCOSE 135* 145*  BUN 28* 25*  CREATININE 1.37* 1.31  CALCIUM 9.3 9.1     Liver Panel   Recent Labs  07/18/14 0430  PROT 7.8  ALBUMIN 2.7*  AST 17  ALT 24  ALKPHOS 101  BILITOT 0.5       Sedimentation Rate No results found for this basename: ESRSEDRATE,  in the last 72 hours C-Reactive Protein No results found for this basename: CRP,  in the last 72 hours  Micro Results: Recent Results (from the past 720 hour(s))  CLOSTRIDIUM DIFFICILE BY PCR     Status: None   Collection Time    07/11/14  9:45 AM      Result Value Ref Range Status   C difficile by pcr NEGATIVE  NEGATIVE Final   Comment: Performed at [facility]  CULTURE, ROUTINE-ABSCESS     Status: None   Collection Time    07/12/14  8:53 AM      Result Value Ref Range Status   Specimen Description ABSCESS PERITONEAL   Final   Special Requests Normal   Final   Gram Stain     Final   Value: MODERATE WBC PRESENT, PREDOMINANTLY MONONUCLEAR     FEW WBC PRESENT, PREDOMINANTLY PMN     NO     SQUAMOUS EPITHELIAL CELLS PRESENT     MODERATE GRAM NEGATIVE RODS   Culture     Final   Value: ABUNDANT ESCHERICHIA COLI     Note: Confirmed Extended Spectrum Beta-Lactamase Producer (ESBL) CRITICAL RESULT CALLED TO, READ BACK BY AND VERIFIED WITH: KATHERINE STRUPE 07/14/14 1115 BY SMITHERSJ     Performed at [facility]   Report Status 07/14/2014 FINAL   Final   Organism ID, Bacteria ESCHERICHIA COLI   Final  CLOSTRIDIUM DIFFICILE BY PCR     Status: None   Collection Time    07/17/14  3:32 PM      Result Value Ref Range Status   C difficile by pcr NEGATIVE  NEGATIVE Final   Comment:  Performed at [facility]  CULTURE, ROUTINE-ABSCESS     Status: None   Collection Time    07-21-2014 11:03 AM      Result Value Ref Range Status   Specimen Description ABDOMEN JPD   Final   Special Requests NONE   Final   Gram Stain     Final   Value: RARE WBC PRESENT, PREDOMINANTLY PMN     NO SQUAMOUS EPITHELIAL CELLS SEEN     MODERATE GRAM NEGATIVE RODS     Performed at [facility]   Culture PENDING   Incomplete   Report Status PENDING   Incomplete    Studies/Results: Dg Abd 2 Views  2014-07-21   CLINICAL DATA:  48 year old male with recent subtotal colectomy. Abdominal pain nausea and vomiting. Initial encounter.  EXAM: ABDOMEN - 2 VIEW  COMPARISON:  07/17/2014 and earlier.  FINDINGS: Enteric tube now in place, side hole projects at the level of the gastric body. Stable left upper abdomen percutaneous pigtail drainage catheter. Continued diffuse small bowel distention with air-fluid levels. Small bowel loops measure up to 48 mm diameter. There is less distal large bowel gas today. Stable visualized osseous structures. Stable lung bases, with atelectasis greater on the left. Partially visible right PICC line. No pneumoperitoneum.  IMPRESSION: 1. Enteric tube placed, side hole the level of the gastric body. 2. No improvement in abnormal bowel gas pattern previously most compatible with diffuse ileus. There is less distal colon gas today, recommend continued radiographic follow-up. 3. No pneumoperitoneum.   Electronically Signed   By: Augusto Gamble M.D.   On: Jul 21, 2014 15:56   Dg Abd Acute W/chest  07/17/2014   CLINICAL DATA:  Nausea vomiting. History of subtotal colectomy 2 weeks ago. Abdominal pain with nausea and vomiting. Per epic notes, the patient having bowel movements and passing gas.  EXAM: ACUTE ABDOMEN SERIES (ABDOMEN 2 VIEW & CHEST 1 VIEW)  COMPARISON:  CT abdomen pelvis 07/17/1999  FINDINGS: Right upper extremity PICC terminates at the junction of the superior vena cava  and right atrium. Heart and mediastinal contours are normal. Lung volumes are slightly low with mild left basilar atelectasis. Negative for airspace disease or pleural effusion.  Pigtail drainage catheter projects over the left upper quadrant.  Diffuse gaseous distention of small bowel loops throughout the abdomen. Gas is seen in the rectum. Small bowel loops measure up to 5 cm diameter. There are multiple air-fluid levels within small bowel loops on the upright view. Negative for free intraperitoneal air.  IMPRESSION: Diffuse gaseous distention of small bowel loops with multiple air-fluid levels (small bowel loops measure up to 5 cm). Gas is seen in the rectum. Findings are favored to be due to ileus, given the clinical history of the patient passing gas and having bowel movements.  No free intraperitoneal air.  Minimal left basilar atelectasis.   Electronically Signed   By: Britta Mccreedy M.D.   On: 07/17/2014 19:04      Assessment/Plan:  Principal Problem:   Sigmoid diverticulitis-recurrent s/p lap assisted subtotal colectomy    [name] [name] is a 48 y.o. male with Complicated diverticulitis sp LAP total colectomy, then found to have intraabdominal abscess with ESBL. Dr Abbey Chatters was worried this am about patient perhaps having other organisms involved in the abscess besides the ESBL. He had brought the patient out to imipenem to cover sit for Pseudomonas. After thorough discussion I was agreeable to adding in MRSA coverage with Zyvox and antifungal coverage with micafungin. He does have fresh blood cultures that  are cooking now. In the interim he was also found to have a superficial thrombosis of the basilic vein  #1 Intraabdominal abscess with ESBL: His WBC has risen again as has his platelets which might suggest signficant inflammatory process still at work. I think this may very well be multifactorial with the patient having an abscess, and ileus and a superficial thrombosis.  I am okay  with broader spectrum antibiotics for now but would like to de-escalate if he improves and we have not proven a second organism.  #2 Superficial thrombosis: warm compresses to site and consider repeat doppler in a few days. I DONT like that it is near his PICC   #3 Screening:  HIV and Hep panel is negative    LOS: 17 days   Acey Lav 07/18/2014, 5:18 PM

## 2014-07-18 NOTE — Progress Notes (Signed)
Bilateral upper extremity venous duplex completed:  No evidence of DVT.  There appears to be superficial thrombosis in the right basilic vein in the proximal forearm, below the PICC site.

## 2014-07-19 ENCOUNTER — Encounter (HOSPITAL_COMMUNITY): Payer: Self-pay | Admitting: Surgery

## 2014-07-19 DIAGNOSIS — E440 Moderate protein-calorie malnutrition: Secondary | ICD-10-CM

## 2014-07-19 DIAGNOSIS — I82619 Acute embolism and thrombosis of superficial veins of unspecified upper extremity: Secondary | ICD-10-CM | POA: Diagnosis not present

## 2014-07-19 DIAGNOSIS — I10 Essential (primary) hypertension: Secondary | ICD-10-CM | POA: Diagnosis present

## 2014-07-19 DIAGNOSIS — D869 Sarcoidosis, unspecified: Secondary | ICD-10-CM | POA: Diagnosis present

## 2014-07-19 DIAGNOSIS — T8149XA Infection following a procedure, other surgical site, initial encounter: Secondary | ICD-10-CM

## 2014-07-19 DIAGNOSIS — K859 Acute pancreatitis without necrosis or infection, unspecified: Secondary | ICD-10-CM | POA: Diagnosis not present

## 2014-07-19 DIAGNOSIS — T8143XA Infection following a procedure, organ and space surgical site, initial encounter: Secondary | ICD-10-CM | POA: Diagnosis not present

## 2014-07-19 DIAGNOSIS — Z8679 Personal history of other diseases of the circulatory system: Secondary | ICD-10-CM | POA: Diagnosis present

## 2014-07-19 LAB — CBC
HEMATOCRIT: 28.3 % — AB (ref 39.0–52.0)
Hemoglobin: 9.3 g/dL — ABNORMAL LOW (ref 13.0–17.0)
MCH: 28.9 pg (ref 26.0–34.0)
MCHC: 32.9 g/dL (ref 30.0–36.0)
MCV: 87.9 fL (ref 78.0–100.0)
Platelets: 776 10*3/uL — ABNORMAL HIGH (ref 150–400)
RBC: 3.22 MIL/uL — ABNORMAL LOW (ref 4.22–5.81)
RDW: 13.8 % (ref 11.5–15.5)
WBC: 19.4 10*3/uL — ABNORMAL HIGH (ref 4.0–10.5)

## 2014-07-19 LAB — COMPREHENSIVE METABOLIC PANEL
ALK PHOS: 89 U/L (ref 39–117)
ALT: 16 U/L (ref 0–53)
AST: 12 U/L (ref 0–37)
Albumin: 2.5 g/dL — ABNORMAL LOW (ref 3.5–5.2)
Anion gap: 11 (ref 5–15)
BILIRUBIN TOTAL: 0.3 mg/dL (ref 0.3–1.2)
BUN: 21 mg/dL (ref 6–23)
CHLORIDE: 101 meq/L (ref 96–112)
CO2: 25 mEq/L (ref 19–32)
Calcium: 8.8 mg/dL (ref 8.4–10.5)
Creatinine, Ser: 1.19 mg/dL (ref 0.50–1.35)
GFR calc Af Amer: 83 mL/min — ABNORMAL LOW (ref >90)
GFR, EST NON AFRICAN AMERICAN: 72 mL/min — AB (ref >90)
GLUCOSE: 134 mg/dL — AB (ref 70–99)
Potassium: 4.2 mEq/L (ref 3.7–5.3)
Sodium: 137 mEq/L (ref 137–147)
Total Protein: 6.9 g/dL (ref 6.0–8.3)

## 2014-07-19 LAB — GLUCOSE, CAPILLARY
GLUCOSE-CAPILLARY: 152 mg/dL — AB (ref 70–99)
GLUCOSE-CAPILLARY: 113 mg/dL — AB (ref 70–99)
Glucose-Capillary: 107 mg/dL — ABNORMAL HIGH (ref 70–99)
Glucose-Capillary: 120 mg/dL — ABNORMAL HIGH (ref 70–99)
Glucose-Capillary: 91 mg/dL (ref 70–99)

## 2014-07-19 LAB — DIFFERENTIAL
BASOS PCT: 0 % (ref 0–1)
Basophils Absolute: 0 10*3/uL (ref 0.0–0.1)
EOS ABS: 0.2 10*3/uL (ref 0.0–0.7)
Eosinophils Relative: 1 % (ref 0–5)
LYMPHS ABS: 1.4 10*3/uL (ref 0.7–4.0)
LYMPHS PCT: 7 % — AB (ref 12–46)
MONO ABS: 1.7 10*3/uL — AB (ref 0.1–1.0)
Monocytes Relative: 9 % (ref 3–12)
NEUTROS ABS: 16.1 10*3/uL — AB (ref 1.7–7.7)
Neutrophils Relative %: 83 % — ABNORMAL HIGH (ref 43–77)

## 2014-07-19 LAB — PHOSPHORUS: Phosphorus: 3.4 mg/dL (ref 2.3–4.6)

## 2014-07-19 LAB — MAGNESIUM: Magnesium: 2.3 mg/dL (ref 1.5–2.5)

## 2014-07-19 MED ORDER — LIP MEDEX EX OINT
1.0000 "application " | TOPICAL_OINTMENT | Freq: Two times a day (BID) | CUTANEOUS | Status: DC
  Administered 2014-07-19 – 2014-07-27 (×11): 1 via TOPICAL
  Filled 2014-07-19 (×2): qty 7

## 2014-07-19 MED ORDER — LACTATED RINGERS IV BOLUS (SEPSIS): 1000.0000 mL | Freq: Three times a day (TID) | INTRAVENOUS | Status: AC | PRN

## 2014-07-19 MED ORDER — LORAZEPAM 2 MG/ML IJ SOLN
0.5000 mg | Freq: Three times a day (TID) | INTRAMUSCULAR | Status: DC | PRN
  Administered 2014-07-24 – 2014-07-27 (×6): 1 mg via INTRAVENOUS
  Filled 2014-07-19 (×6): qty 1

## 2014-07-19 MED ORDER — MORPHINE SULFATE 2 MG/ML IJ SOLN
2.0000 mg | INTRAMUSCULAR | Status: DC | PRN
  Administered 2014-07-19 – 2014-07-21 (×11): 2 mg via INTRAVENOUS
  Filled 2014-07-19 (×11): qty 1

## 2014-07-19 MED ORDER — ALUM & MAG HYDROXIDE-SIMETH 200-200-20 MG/5ML PO SUSP
30.0000 mL | Freq: Four times a day (QID) | ORAL | Status: DC | PRN
  Filled 2014-07-19: qty 30

## 2014-07-19 MED ORDER — DIPHENHYDRAMINE HCL 50 MG/ML IJ SOLN: 12.5000 mg | Freq: Four times a day (QID) | INTRAMUSCULAR | Status: DC | PRN

## 2014-07-19 MED ORDER — FAT EMULSION 20 % IV EMUL
250.0000 mL | INTRAVENOUS | Status: AC
  Administered 2014-07-19: 250 mL via INTRAVENOUS
  Filled 2014-07-19: qty 250

## 2014-07-19 MED ORDER — ENOXAPARIN SODIUM 60 MG/0.6ML ~~LOC~~ SOLN
50.0000 mg | SUBCUTANEOUS | Status: DC
Start: 2014-07-20 — End: 2014-07-28
  Administered 2014-07-20 – 2014-07-27 (×7): 50 mg via SUBCUTANEOUS
  Filled 2014-07-19 (×9): qty 0.6

## 2014-07-19 MED ORDER — MAGIC MOUTHWASH
15.0000 mL | Freq: Four times a day (QID) | ORAL | Status: DC | PRN
  Filled 2014-07-19: qty 15

## 2014-07-19 MED ORDER — TRACE MINERALS CR-CU-F-FE-I-MN-MO-SE-ZN IV SOLN
INTRAVENOUS | Status: AC
  Administered 2014-07-19: 17:00:00 via INTRAVENOUS
  Filled 2014-07-19: qty 2000

## 2014-07-19 MED ORDER — MENTHOL 3 MG MT LOZG: 1.0000 | LOZENGE | OROMUCOSAL | Status: DC | PRN

## 2014-07-19 MED ORDER — LACTATED RINGERS IV BOLUS (SEPSIS)
1000.0000 mL | Freq: Three times a day (TID) | INTRAVENOUS | Status: DC | PRN
Start: 2014-07-19 — End: 2014-07-20

## 2014-07-19 MED ORDER — METOPROLOL TARTRATE 1 MG/ML IV SOLN
5.0000 mg | Freq: Four times a day (QID) | INTRAVENOUS | Status: DC | PRN
  Filled 2014-07-19: qty 5

## 2014-07-19 MED ORDER — PHENOL 1.4 % MT LIQD
2.0000 | OROMUCOSAL | Status: DC | PRN
  Filled 2014-07-19: qty 177

## 2014-07-19 NOTE — Progress Notes (Signed)
[facility]  Day # 2 imipenem Day #2 zyvox Day # 2 micafungin  4 day of Invanz  Subjective: Feels better though still with NG tube   Antibiotics:  Anti-infectives   Start     Dose/Rate Route Frequency Ordered Stop   07/18/14 1100  micafungin (MYCAMINE) 150 mg in sodium chloride 0.9 % 100 mL IVPB     150 mg 100 mL/hr over 1 Hours Intravenous Every 24 hours 07/18/14 0907     07/18/14 1000  linezolid (ZYVOX) IVPB 600 mg     600 mg 300 mL/hr over 60 Minutes Intravenous Every 12 hours 07/18/14 0907     07/17/14 2000  imipenem-cilastatin (PRIMAXIN) 500 mg in sodium chloride 0.9 % 100 mL IVPB     500 mg 200 mL/hr over 30 Minutes Intravenous Every 6 hours 07/17/14 1743     07/14/14 1600  ertapenem (INVANZ) 1 g in sodium chloride 0.9 % 50 mL IVPB  Status:  Discontinued     1 g 100 mL/hr over 30 Minutes Intravenous Every 24 hours 07/14/14 1457 07/17/14 1743   07/14/14 0800  piperacillin-tazobactam (ZOSYN) IVPB 3.375 g  Status:  Discontinued     3.375 g 12.5 mL/hr over 240 Minutes Intravenous Every 8 hours 07/14/14 0723 07/14/14 1456   07/08/14 2100  vancomycin (VANCOCIN) IVPB 1000 mg/200 mL premix  Status:  Discontinued     1,000 mg 200 mL/hr over 60 Minutes Intravenous Every 12 hours 07/08/14 1104 07/10/14 0832   07/08/14 0800  vancomycin (VANCOCIN) 2,000 mg in sodium chloride 0.9 % 500 mL IVPB     2,000 mg 250 mL/hr over 120 Minutes Intravenous  Once 07/08/14 0757 07/08/14 1107   07/07/14 1000  piperacillin-tazobactam (ZOSYN) IVPB 3.375 g  Status:  Discontinued     3.375 g 12.5 mL/hr over 240 Minutes Intravenous Every 8 hours 07/07/14 0821 07/10/14 0832   07/01/14 2000  cefoTEtan (CEFOTAN) 2 g in dextrose 5 % 50 mL IVPB     2 g 100 mL/hr over 30 Minutes Intravenous Every 12 hours 07/01/14 1339 07/01/14 2015   07/01/14 0625  cefoTEtan in Dextrose 5% (CEFOTAN) IVPB 2 g  Status:  Discontinued     2 g Intravenous Every 12 hours 07/01/14 0625 07/01/14 1413       Medications: Scheduled Meds: . enoxaparin (LOVENOX) injection  40 mg Subcutaneous Q24H  . imipenem-cilastatin  500 mg Intravenous Q6H  . insulin aspart  0-9 Units Subcutaneous 4 times per day  . linezolid  600 mg Intravenous Q12H  . lip balm  1 application Topical BID  . micafungin [facility](MYCAMINE) IV  150 mg Intravenous Q24H  . pantoprazole (PROTONIX) IV  40 mg Intravenous QHS   Continuous Infusions: . sodium chloride 90 mL/hr at 07/19/14 0909  . Marland Kitchen.TPN (CLINIMIX-E) Adult 60 mL/hr at 07/18/14 1713   And  . fat emulsion 250 mL (07/18/14 1713)  . Marland Kitchen.TPN (CLINIMIX-E) Adult     And  . fat emulsion     PRN Meds:.alum & mag hydroxide-simeth, diphenhydrAMINE, lactated ringers, LORazepam, magic mouthwash, menthol-cetylpyridinium, metoprolol, morphine injection, ondansetron (ZOFRAN) IV, ondansetron, phenol, promethazine, sodium chloride    Objective: Weight change:   Intake/Output Summary (Last 24 hours) at 07/19/14 1608 Last data filed at 07/19/14 1428  Gross per 24 hour  Intake 4058.34 ml  Output   1170 ml  Net 2888.34 ml   Blood pressure 130/81, pulse 81, temperature 98.2 F (36.8 C), temperature source Oral, resp. rate  18, height 6' (1.829 m), weight 227 lb 15.3 oz (103.4 kg), SpO2 98.00%. Temp:  [98.2 F (36.8 C)-98.7 F (37.1 C)] 98.2 F (36.8 C) (10/10 1428) Pulse Rate:  [81-83] 81 (10/10 1428) Resp:  [18] 18 (10/10 1428) BP: (126-130)/(77-81) 130/81 mmHg (10/10 1428) SpO2:  [97 %-98 %] 98 % (10/10 1428)  Physical Exam: General: Alert and awake, oriented x3, w NG tube HEENT: anicteric sclera, EOMI,  CVS regular rate, normal r, no murmur rubs or gallops  Chest: clear to auscultation bilaterally, no wheezing, rales or rhonchi  Abdomen: surgical sites are clean, JP drain is in place with less material in drain today, diffusely tender ,  Skin: no rashes  Neuro: nonfocal, strength and sensation intact   CBC:  Recent Labs Lab 07/15/14 0558 07/16/14 0830  07/17/14 0500 07/18/14 0430 07/19/14 0420  HGB 10.0* 11.0* 10.7* 10.5* 9.3*  HCT 30.1* 34.1* 32.8* 31.1* 28.3*  PLT 764* 989* 963* 859* 776*     BMET  Recent Labs  07/18/14 1244 07/19/14 0420  NA 134* 137  K 5.0 4.2  CL 97 101  CO2 22 25  GLUCOSE 145* 134*  BUN 25* 21  CREATININE 1.31 1.19  CALCIUM 9.1 8.8     Liver Panel   Recent Labs  07/18/14 0430 07/19/14 0420  PROT 7.8 6.9  ALBUMIN 2.7* 2.5*  AST 17 12  ALT 24 16  ALKPHOS 101 89  BILITOT 0.5 0.3       Sedimentation Rate No results found for this basename: ESRSEDRATE,  in the last 72 hours C-Reactive Protein No results found for this basename: CRP,  in the last 72 hours  Micro Results: Recent Results (from the past 720 hour(s))  CLOSTRIDIUM DIFFICILE BY PCR     Status: None   Collection Time    07/11/14  9:45 AM      Result Value Ref Range Status   C difficile by pcr NEGATIVE  NEGATIVE Final   Comment: Performed at [facility]  CULTURE, ROUTINE-ABSCESS     Status: None   Collection Time    07/12/14  8:53 AM      Result Value Ref Range Status   Specimen Description ABSCESS PERITONEAL   Final   Special Requests Normal   Final   Gram Stain     Final   Value: MODERATE WBC PRESENT, PREDOMINANTLY MONONUCLEAR     FEW WBC PRESENT, PREDOMINANTLY PMN     NO     SQUAMOUS EPITHELIAL CELLS PRESENT     MODERATE GRAM NEGATIVE RODS   Culture     Final   Value: ABUNDANT ESCHERICHIA COLI     Note: Confirmed Extended Spectrum Beta-Lactamase Producer (ESBL) CRITICAL RESULT CALLED TO, READ BACK BY AND VERIFIED WITH: KATHERINE STRUPE 07/14/14 1115 BY SMITHERSJ     Performed at [facility]   Report Status 07/14/2014 FINAL   Final   Organism ID, Bacteria ESCHERICHIA COLI   Final  CLOSTRIDIUM DIFFICILE BY PCR     Status: None   Collection Time    07/17/14  3:32 PM      Result Value Ref Range Status   C difficile by pcr NEGATIVE  NEGATIVE Final   Comment: Performed at [place]   CULTURE, BLOOD (ROUTINE X 2)     Status: None   Collection Time    07/18/14  8:59 AM      Result Value Ref Range Status   Specimen Description BLOOD LEFT ARM  Final   Special Requests BOTTLES DRAWN AEROBIC AND ANAEROBIC 10CC EA   Final   Culture  Setup Time     Final   Value: 07/18/2014 12:19     [facility]   Culture     Final   Value:        BLOOD CULTURE RECEIVED NO GROWTH TO DATE CULTURE WILL BE HELD FOR 5 DAYS BEFORE ISSUING A FINAL NEGATIVE REPORT     [facility]formed [facility]   Report Status PENDING   Incomplete  CULTURE, BLOOD (ROUTINE X 2)     Status: None   Collection Time    07/18/14  9:03 AM      Result Value Ref Range Status   Specimen Description BLOOD LEFT ARM   Final   Special Requests BOTTLES DRAWN AEROBIC AND ANAEROBIC 10CC EA   Final   Culture  Setup Time     Final   Value: 07/18/2014 12:19     [facility]formed [facility]   Culture     Final   Value:        BLOOD CULTURE RECEIVED NO GROWTH TO DATE CULTURE WILL BE HELD FOR 5 DAYS BEFORE ISSUING A FINAL NEGATIVE REPORT     [facility]formed at [facility]   Report Status PENDING   Incomplete  CULTURE, ROUTINE-ABSCESS     Status: None   Collection Time    07/18/14 11:03 AM      Result Value Ref Range Status   Specimen Description ABDOMEN JPD   Final   Special Requests NONE   Final   Gram Stain     Final   Value: RARE WBC PRESENT, PREDOMINANTLY PMN     NO SQUAMOUS EPITHELIAL CELLS SEEN     MODERATE GRAM NEGATIVE RODS     [facility]formed at [facility]   Culture     Final   Value: ABUNDANT ESCHERICHIA COLI     Performed at [facility]   Report Status PENDING   Incomplete    Studies/Results: Dg Abd 2 Views  07/18/2014   CLINICAL DATA:  48 year old male with recent subtotal colectomy. Abdominal pain nausea and vomiting. Initial encounter.  EXAM: ABDOMEN - 2 VIEW  COMPARISON:  07/17/2014 and earlier.  FINDINGS: Enteric tube now in place, side hole projects at  the level of the gastric body. Stable left upper abdomen percutaneous pigtail drainage catheter. Continued diffuse small bowel distention with air-fluid levels. Small bowel loops measure up to 48 mm diameter. There is less distal large bowel gas today. Stable visualized osseous structures. Stable lung bases, with atelectasis greater on the left. Partially visible right PICC line. No pneumoperitoneum.  IMPRESSION: 1. Enteric tube placed, side hole the level of the gastric body. 2. No improvement in abnormal bowel gas pattern previously most compatible with diffuse ileus. There is less distal colon gas today, recommend continued radiographic follow-up. 3. No pneumoperitoneum.   Electronically Signed   By: Augusto GambleLee  Hall M.D.   On: 07/18/2014 15:56   Dg Abd Acute W/chest  07/17/2014   CLINICAL DATA:  Nausea vomiting. History of subtotal colectomy 2 weeks ago. Abdominal pain with nausea and vomiting. Per epic notes, the patient having bowel movements and passing gas.  EXAM: ACUTE ABDOMEN SERIES (ABDOMEN 2 VIEW & CHEST 1 VIEW)  COMPARISON:  CT abdomen pelvis 07/17/1999  FINDINGS: Right upper extremity PICC terminates at the junction of the superior vena cava and right atrium. Heart and mediastinal  contours are normal. Lung volumes are slightly low with mild left basilar atelectasis. Negative for airspace disease or pleural effusion.  Pigtail drainage catheter projects over the left upper quadrant.  Diffuse gaseous distention of small bowel loops throughout the abdomen. Gas is seen in the rectum. Small bowel loops measure up to 5 cm diameter. There are multiple air-fluid levels within small bowel loops on the upright view. Negative for free intraperitoneal air.  IMPRESSION: Diffuse gaseous distention of small bowel loops with multiple air-fluid levels (small bowel loops measure up to 5 cm). Gas is seen in the rectum. Findings are favored to be due to ileus, given the clinical history of the patient passing gas and having  bowel movements.  No free intraperitoneal air.  Minimal left basilar atelectasis.   Electronically Signed   By: Britta Mccreedy M.D.   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 male with Complicated diverticulitis sp LAP total colectomy, then found to have intraabdominal abscess with ESBL. Dr Abbey Chatters was worried this am about patient perhaps having other organisms involved in the abscess besides the ESBL. He had brought the patient out to imipenem to cover sit for Pseudomonas. After thorough discussion I was agreeable to adding in MRSA coverage with Zyvox and antifungal coverage with micafungin. He does have fresh blood cultures that are cooking now. In the interim he was also found to have a superficial thrombosis of the basilic vein  #1 Intraabdominal abscess with ESBL: His WBC now down post NG tube insertion, backing off of feeding, broader spectrum abx  I am okay with broader spectrum antibiotics for now but would like to de-escalate in future  #2 Superficial thrombosis: warm compresses to site and consider repeat doppler in a few days. I DONT like that it is near his PICC And agree may want to get rid of PICC     LOS: 18 days   Acey Lav 07/19/2014, 4:08 PM

## 2014-07-19 NOTE — Progress Notes (Addendum)
[name] [place], [place] [address] Phone: [phone] FAX: [phone]    [name] [name] [id number] 1966-02-17  CARE TEAM:  PCP: Geoffery Lyons, MD  Outpatient Care Team: Patient Care Team: Geoffery Lyons, MD as PCP - General (Internal Medicine)  Inpatient Treatment Team: Treatment Team: Attending Provider: Michael Boston, MD; Registered Nurse: Rise Patience, RN; Registered Nurse: Kristopher Oppenheim, RN; Dietitian: Toribio Harbour, RD; Technician: Leda Quail, NT; Registered Nurse: Metta Clines, RN; Consulting Physician: [name] Nations, MD   Subjective:  Patient in better spirits.  Having some flatus.  Denies much abdominal pain.  Family in room.  Nurse Nevin Bloodgood in room.  Objective:  Vital signs:  Filed Vitals:   07/18/14 0546 07/18/14 1400 07/18/14 2200 07/19/14 0600  BP: 118/76 134/77 126/78 130/77  Pulse: 77 80 83 81  Temp: 98.2 F (36.8 C) 98.6 F (37 C) 98.7 F (37.1 C) 98.7 F (37.1 C)  TempSrc: Oral Oral Oral Oral  Resp: 18 18 18 18   Height:      Weight:      SpO2: 99% 99% 98% 97%    Last BM Date: 07/17/14  Intake/Output   Yesterday:  10/09 0701 - 10/10 0700 In: 3823.3 [I.V.:1723.5; IV Piggyback:1200; TPN:894.8] Out: 9528 [Urine:500; Emesis/NG output:1000; Drains:30] This shift:     Bowel function:  Flatus: y  BM: small loose  Drain: thin NGT output  Physical Exam:  General: Pt awake/alert/oriented x4 in no acute distress Eyes: PERRL, normal EOM.  Sclera clear.  No icterus Neuro: CN II-XII intact w/o focal sensory/motor deficits. Lymph: No head/neck/groin lymphadenopathy Psych:  No delerium/psychosis/paranoia HENT: Normocephalic, Mucus membranes moist.  No thrush Neck: Supple, No tracheal deviation Chest: No chest wall pain w good excursion CV:  Pulses intact.  Regular rhythm MS: Normal AROM mjr joints.  No obvious deformity Abdomen: Soft.   Mildly distended.  Incision clean/dry/intact.  Mildly tender at incisions only.  No evidence of peritonitis.  No incarcerated hernias. Ext:  SCDs BLE.  No mjr edema.  No cyanosis Skin: No petechiae / purpura   Problem List:   Principal Problem:   Sigmoid diverticulitis-recurrent s/p lap assisted subtotal colectomy   Assessment  [name] [name]  48 y.o. male  18 Days Post-Op  Procedure(s): PREOPERATIVE DX: Recurrent sigmoid diverticulitis  POSTOPERATIVE DX: Same  PROCEDURE:laparoscopic-assisted subtotal colectomy with partial omentectomy, incidental appendectomy, rigid proctosigmoidoscopy.  Surgeon: Odis Hollingshead  Assistants: Neysa Bonito, M.D.   Prolonged possible postoperative ileus most likely due to postoperative abscess near pancreas.  Suspect had pancreatitis.  Slowly improving.  Plan:  Clamp NG tube trail.  Trial of thin liquids.  Continue IV TNA nutrition.  Continue aggressive IV antibiotics for now x 5 days minimum.  Improving clinically & lower white count hopeful sign.  F/u cultures  Heat/elevation for RUE  superficial DVT near PICC - consider removal & new PICC in LUE if WBC not better or pt develops pain/swelling in RUE  Mild ARF resolving - follow  HTN control  VTE prophylaxis- SCDs, etc  Mobilize as tolerated to help recovery - Get him up  [name], M.D., F.A.C.S. Gastrointestinal and Minimally Invasive Surgery [facility] [address] [place], [place] [address] [phone] Main / Paging   07/19/2014   Results:   Labs: Results for orders placed during the hospital encounter of 07/01/14 (from the past 48 hour(s))  LIPASE, BLOOD  Status: None   Collection Time    07/17/14  9:15 AM      Result Value Ref Range   Lipase 38  11 - 59 U/L  AMYLASE     Status: None   Collection Time    07/17/14  9:15 AM      Result Value Ref Range   Amylase 52  0 - 105 U/L  AMYLASE, PERITONEAL FLUID     Status:  None   Collection Time    07/17/14  9:54 AM      Result Value Ref Range   Amylase, peritoneal fluid 14     Comment: (NOTE)     Reference range:     Values greater than or equal to three times a simultaneously analyzed     serum value are considered abnormal in ascites of pancreatic origin.     Other peritoneal and intestional disorders may also cause elevation     of amylase.     Performed at [facility], CAP[facility]     Status: Abnormal   Collection Time    07/17/14 11:36 AM      Result Value Ref Range   Glucose-Capillary 108 (*) 70 - 99 mg/dL  CLOSTRIDIUM DIFFICILE BY PCR     Status: None   Collection Time    07/17/14  3:32 PM      Result Value Ref Range   C difficile by pcr NEGATIVE  NEGATIVE   Comment: Performed at [place], [facility]     Status: Abnormal   Collection Time    07/17/14  5:54 PM      Result Value Ref Range   Glucose-Capillary 139 (*) 70 - 99 mg/dL  GLUCOSE, CAPILLARY     Status: Abnormal   Collection Time    07/17/14 11:22 PM      Result Value Ref Range   Glucose-Capillary 130 (*) 70 - 99 mg/dL  COMPREHENSIVE METABOLIC PANEL     Status: Abnormal   Collection Time    07/18/14  4:30 AM      Result Value Ref Range   Sodium 133 (*) 137 - 147 mEq/L   Potassium 4.8  3.7 - 5.3 mEq/L   Chloride 95 (*) 96 - 112 mEq/L   CO2 22  19 - 32 mEq/L   Glucose, Bld 135 (*) 70 - 99 mg/dL   BUN 28 (*) 6 - 23 mg/dL   Creatinine, Ser 1.37 (*) 0.50 - 1.35 mg/dL   Calcium 9.3  8.4 - 10.5 mg/dL   Total Protein 7.8  6.0 - 8.3 g/dL   Albumin 2.7 (*) 3.5 - 5.2 g/dL   AST 17  0 - 37 U/L   ALT 24  0 - 53 U/L   Alkaline Phosphatase 101  39 - 117 U/L   Total Bilirubin 0.5  0.3 - 1.2 mg/dL   GFR calc non Af Amer 60 (*) >90 mL/min   GFR calc Af Amer 70 (*) >90 mL/min   Comment: (NOTE)     The eGFR has been calculated using the CKD EPI equation.     This calculation has not been validated in all clinical situations.     eGFR's persistently  <90 mL/min signify possible Chronic Kidney     Disease.   Anion gap 16 (*) 5 - 15  MAGNESIUM     Status: None   Collection Time    07/18/14  4:30 AM      Result  Value Ref Range   Magnesium 2.3  1.5 - 2.5 mg/dL  PHOSPHORUS     Status: Abnormal   Collection Time    07/18/14  4:30 AM      Result Value Ref Range   Phosphorus 5.0 (*) 2.3 - 4.6 mg/dL  CBC     Status: Abnormal   Collection Time    07/18/14  4:30 AM      Result Value Ref Range   WBC 27.1 (*) 4.0 - 10.5 K/uL   RBC 3.53 (*) 4.22 - 5.81 MIL/uL   Hemoglobin 10.5 (*) 13.0 - 17.0 g/dL   HCT 31.1 (*) 39.0 - 52.0 %   MCV 88.1  78.0 - 100.0 fL   MCH 29.7  26.0 - 34.0 pg   MCHC 33.8  30.0 - 36.0 g/dL   RDW 13.8  11.5 - 15.5 %   Platelets 859 (*) 150 - 400 K/uL  PREALBUMIN     Status: Abnormal   Collection Time    07/18/14  4:30 AM      Result Value Ref Range   Prealbumin 14.7 (*) 17.0 - 34.0 mg/dL   Comment: Performed at [facility]     Status: None   Collection Time    07/18/14  4:30 AM      Result Value Ref Range   Triglycerides 84  <150 mg/dL   Comment: Performed at [facility] ACID, PLASMA     Status: None   Collection Time    07/18/14  4:30 AM      Result Value Ref Range   Lactic Acid, Venous 0.9  0.5 - 2.2 mmol/L  GLUCOSE, CAPILLARY     Status: Abnormal   Collection Time    07/18/14  6:18 AM      Result Value Ref Range   Glucose-Capillary 123 (*) 70 - 99 mg/dL  CULTURE, ROUTINE-ABSCESS     Status: None   Collection Time    07/18/14 11:03 AM      Result Value Ref Range   Specimen Description ABDOMEN JPD     Special Requests NONE     Gram Stain       Value: RARE WBC PRESENT, PREDOMINANTLY PMN     NO SQUAMOUS EPITHELIAL CELLS SEEN     MODERATE GRAM NEGATIVE RODS     Performed at [facility]   Culture PENDING     Report Status PENDING    GLUCOSE, CAPILLARY     Status: Abnormal   Collection Time    07/18/14 11:58 AM      Result Value Ref Range    Glucose-Capillary 138 (*) 70 - 99 mg/dL  BASIC METABOLIC PANEL     Status: Abnormal   Collection Time    07/18/14 12:44 PM      Result Value Ref Range   Sodium 134 (*) 137 - 147 mEq/L   Potassium 5.0  3.7 - 5.3 mEq/L   Chloride 97  96 - 112 mEq/L   CO2 22  19 - 32 mEq/L   Glucose, Bld 145 (*) 70 - 99 mg/dL   BUN 25 (*) 6 - 23 mg/dL   Creatinine, Ser 1.31  0.50 - 1.35 mg/dL   Calcium 9.1  8.4 - 10.5 mg/dL   GFR calc non Af Amer 64 (*) >90 mL/min   GFR calc Af Amer 74 (*) >90 mL/min   Comment: (NOTE)     The eGFR has been calculated using the  CKD EPI equation.     This calculation has not been validated in all clinical situations.     eGFR's persistently <90 mL/min signify possible Chronic Kidney     Disease.   Anion gap 15  5 - 15  PHOSPHORUS     Status: None   Collection Time    07/18/14 12:44 PM      Result Value Ref Range   Phosphorus 3.5  2.3 - 4.6 mg/dL  GLUCOSE, CAPILLARY     Status: Abnormal   Collection Time    07/18/14  5:38 PM      Result Value Ref Range   Glucose-Capillary 130 (*) 70 - 99 mg/dL  GLUCOSE, CAPILLARY     Status: Abnormal   Collection Time    07/19/14 12:19 AM      Result Value Ref Range   Glucose-Capillary 107 (*) 70 - 99 mg/dL  COMPREHENSIVE METABOLIC PANEL     Status: Abnormal   Collection Time    07/19/14  4:20 AM      Result Value Ref Range   Sodium 137  137 - 147 mEq/L   Potassium 4.2  3.7 - 5.3 mEq/L   Chloride 101  96 - 112 mEq/L   CO2 25  19 - 32 mEq/L   Glucose, Bld 134 (*) 70 - 99 mg/dL   BUN 21  6 - 23 mg/dL   Creatinine, Ser 1.19  0.50 - 1.35 mg/dL   Calcium 8.8  8.4 - 10.5 mg/dL   Total Protein 6.9  6.0 - 8.3 g/dL   Albumin 2.5 (*) 3.5 - 5.2 g/dL   AST 12  0 - 37 U/L   ALT 16  0 - 53 U/L   Alkaline Phosphatase 89  39 - 117 U/L   Total Bilirubin 0.3  0.3 - 1.2 mg/dL   GFR calc non Af Amer 72 (*) >90 mL/min   GFR calc Af Amer 83 (*) >90 mL/min   Comment: (NOTE)     The eGFR has been calculated using the CKD EPI equation.      This calculation has not been validated in all clinical situations.     eGFR's persistently <90 mL/min signify possible Chronic Kidney     Disease.   Anion gap 11  5 - 15  MAGNESIUM     Status: None   Collection Time    07/19/14  4:20 AM      Result Value Ref Range   Magnesium 2.3  1.5 - 2.5 mg/dL  PHOSPHORUS     Status: None   Collection Time    07/19/14  4:20 AM      Result Value Ref Range   Phosphorus 3.4  2.3 - 4.6 mg/dL  CBC     Status: Abnormal   Collection Time    07/19/14  4:20 AM      Result Value Ref Range   WBC 19.4 (*) 4.0 - 10.5 K/uL   RBC 3.22 (*) 4.22 - 5.81 MIL/uL   Hemoglobin 9.3 (*) 13.0 - 17.0 g/dL   HCT 28.3 (*) 39.0 - 52.0 %   MCV 87.9  78.0 - 100.0 fL   MCH 28.9  26.0 - 34.0 pg   MCHC 32.9  30.0 - 36.0 g/dL   RDW 13.8  11.5 - 15.5 %   Platelets 776 (*) 150 - 400 K/uL  DIFFERENTIAL     Status: Abnormal   Collection Time    07/19/14  4:20 AM  Result Value Ref Range   Neutrophils Relative % 83 (*) 43 - 77 %   Lymphocytes Relative 7 (*) 12 - 46 %   Monocytes Relative 9  3 - 12 %   Eosinophils Relative 1  0 - 5 %   Basophils Relative 0  0 - 1 %   Neutro Abs 16.1 (*) 1.7 - 7.7 K/uL   Lymphs Abs 1.4  0.7 - 4.0 K/uL   Monocytes Absolute 1.7 (*) 0.1 - 1.0 K/uL   Eosinophils Absolute 0.2  0.0 - 0.7 K/uL   Basophils Absolute 0.0  0.0 - 0.1 K/uL  GLUCOSE, CAPILLARY     Status: Abnormal   Collection Time    07/19/14  6:36 AM      Result Value Ref Range   Glucose-Capillary 120 (*) 70 - 99 mg/dL    Imaging / Studies: Dg Abd 2 Views  07/18/2014   CLINICAL DATA:  48 year old male with recent subtotal colectomy. Abdominal pain nausea and vomiting. Initial encounter.  EXAM: ABDOMEN - 2 VIEW  COMPARISON:  07/17/2014 and earlier.  FINDINGS: Enteric tube now in place, side hole projects at the level of the gastric body. Stable left upper abdomen percutaneous pigtail drainage catheter. Continued diffuse small bowel distention with air-fluid levels. Small bowel  loops measure up to 48 mm diameter. There is less distal large bowel gas today. Stable visualized osseous structures. Stable lung bases, with atelectasis greater on the left. Partially visible right PICC line. No pneumoperitoneum.  IMPRESSION: 1. Enteric tube placed, side hole the level of the gastric body. 2. No improvement in abnormal bowel gas pattern previously most compatible with diffuse ileus. There is less distal colon gas today, recommend continued radiographic follow-up. 3. No pneumoperitoneum.   Electronically Signed   By: Lars Pinks M.D.   On: 07/18/2014 15:56   Dg Abd Acute W/chest  07/17/2014   CLINICAL DATA:  Nausea vomiting. History of subtotal colectomy 2 weeks ago. Abdominal pain with nausea and vomiting. Per epic notes, the patient having bowel movements and passing gas.  EXAM: ACUTE ABDOMEN SERIES (ABDOMEN 2 VIEW & CHEST 1 VIEW)  COMPARISON:  CT abdomen pelvis 07/17/1999  FINDINGS: Right upper extremity PICC terminates at the junction of the superior vena cava and right atrium. Heart and mediastinal contours are normal. Lung volumes are slightly low with mild left basilar atelectasis. Negative for airspace disease or pleural effusion.  Pigtail drainage catheter projects over the left upper quadrant.  Diffuse gaseous distention of small bowel loops throughout the abdomen. Gas is seen in the rectum. Small bowel loops measure up to 5 cm diameter. There are multiple air-fluid levels within small bowel loops on the upright view. Negative for free intraperitoneal air.  IMPRESSION: Diffuse gaseous distention of small bowel loops with multiple air-fluid levels (small bowel loops measure up to 5 cm). Gas is seen in the rectum. Findings are favored to be due to ileus, given the clinical history of the patient passing gas and having bowel movements.  No free intraperitoneal air.  Minimal left basilar atelectasis.   Electronically Signed   By: Curlene Dolphin M.D.   On: 07/17/2014 19:04    Medications /  Allergies: per chart  Antibiotics: Anti-infectives   Start     Dose/Rate Route Frequency Ordered Stop   07/18/14 1100  micafungin (MYCAMINE) 150 mg in sodium chloride 0.9 % 100 mL IVPB     150 mg 100 mL/hr over 1 Hours Intravenous Every 24 hours 07/18/14 0907  07/18/14 1000  linezolid (ZYVOX) IVPB 600 mg     600 mg 300 mL/hr over 60 Minutes Intravenous Every 12 hours 07/18/14 0907     07/17/14 2000  imipenem-cilastatin (PRIMAXIN) 500 mg in sodium chloride 0.9 % 100 mL IVPB     500 mg 200 mL/hr over 30 Minutes Intravenous Every 6 hours 07/17/14 1743     07/14/14 1600  ertapenem (INVANZ) 1 g in sodium chloride 0.9 % 50 mL IVPB  Status:  Discontinued     1 g 100 mL/hr over 30 Minutes Intravenous Every 24 hours 07/14/14 1457 07/17/14 1743   07/14/14 0800  piperacillin-tazobactam (ZOSYN) IVPB 3.375 g  Status:  Discontinued     3.375 g 12.5 mL/hr over 240 Minutes Intravenous Every 8 hours 07/14/14 0723 07/14/14 1456   07/08/14 2100  vancomycin (VANCOCIN) IVPB 1000 mg/200 mL premix  Status:  Discontinued     1,000 mg 200 mL/hr over 60 Minutes Intravenous Every 12 hours 07/08/14 1104 07/10/14 0832   07/08/14 0800  vancomycin (VANCOCIN) 2,000 mg in sodium chloride 0.9 % 500 mL IVPB     2,000 mg 250 mL/hr over 120 Minutes Intravenous  Once 07/08/14 0757 07/08/14 1107   07/07/14 1000  piperacillin-tazobactam (ZOSYN) IVPB 3.375 g  Status:  Discontinued     3.375 g 12.5 mL/hr over 240 Minutes Intravenous Every 8 hours 07/07/14 0821 07/10/14 0832   07/01/14 2000  cefoTEtan (CEFOTAN) 2 g in dextrose 5 % 50 mL IVPB     2 g 100 mL/hr over 30 Minutes Intravenous Every 12 hours 07/01/14 1339 07/01/14 2015   07/01/14 0625  cefoTEtan in Dextrose 5% (CEFOTAN) IVPB 2 g  Status:  Discontinued     2 g Intravenous Every 12 hours 07/01/14 0625 07/01/14 1413       Note: Portions of this report may have been transcribed using voice recognition software. Every effort was made to ensure accuracy; however,  inadvertent computerized transcription errors may be present.   Any transcriptional errors that result from this process are unintentional.

## 2014-07-19 NOTE — Progress Notes (Signed)
PARENTERAL NUTRITION CONSULT NOTE  Pharmacy Consult for TNA Indication: Prolonged postoperative ileus  Allergies  Allergen Reactions  . Celebrex [Celecoxib] Other (See Comments)    "Mom is allergic, so I do not take it"  . Sulfa Antibiotics Nausea And Vomiting  . Codeine Rash  . Dilaudid [Hydromorphone Hcl] Nausea And Vomiting and Anxiety   Patient Measurements: Height: 6' (182.9 cm) Weight: 227 lb 15.3 oz (103.4 kg) IBW/kg (Calculated) : 77.6  Vital Signs: Temp: 98.7 F (37.1 C) (10/10 0600) Temp Source: Oral (10/10 0600) BP: 130/77 mmHg (10/10 0600) Pulse Rate: 81 (10/10 0600) Intake/Output from previous day: 10/09 0701 - 10/10 0700 In: 3823.3 [I.V.:1723.5; IV Piggyback:1200; TPN:894.8] Out: 1530 [Urine:500; Emesis/NG output:1000; Drains:30] Intake/Output from this shift: Total I/O In: 240 [Other:120; NG/GT:120] Out: -  Labs:  Recent Labs  07/17/14 0500 07/18/14 0430 07/19/14 0420  WBC 24.4* 27.1* 19.4*  HGB 10.7* 10.5* 9.3*  HCT 32.8* 31.1* 28.3*  PLT 963* 859* 776*    Recent Labs  07/18/14 0430 07/18/14 1244 07/19/14 0420  NA 133* 134* 137  K 4.8 5.0 4.2  CL 95* 97 101  CO2 22 22 25   GLUCOSE 135* 145* 134*  BUN 28* 25* 21  CREATININE 1.37* 1.31 1.19  CALCIUM 9.3 9.1 8.8  MG 2.3  --  2.3  PHOS 5.0* 3.5 3.4  PROT 7.8  --  6.9  ALBUMIN 2.7*  --  2.5*  AST 17  --  12  ALT 24  --  16  ALKPHOS 101  --  89  BILITOT 0.5  --  0.3  PREALBUMIN 14.7*  --   --   TRIG 84  --   --    Estimated Creatinine Clearance: 96.4 ml/min (by C-G formula based on Cr of 1.19).    Recent Labs  07/18/14 1738 07/19/14 0019 07/19/14 0636  GLUCAP 130* 107* 120*   Insulin Requirements in past 24 hrs: sensitive SSI q6h: 1 unit / 24 hrs.  Current Nutrition: NPO  IVF: NS at 90 ml/hr  Central access: PICC placed 9/29 TPN start date: 9/29  ASSESSMENT                                                                                                       HPI:  [name] [name]  yoM with recurrent sigmoid diverticulitis. S/p lap assisted subtotal colectomy on 9/22 with subsequent postop ileus. Initially, TPN was started 9/27 and stopped 10/7 when he was tolerating an enteral diet.  Pharmacy was consulted to resume TPN on 10/9 following recurrent ileus on 10/8.    Significant events:  9/29: Had some crampy upper abdominal pain then N/V. 10/2: Passing flatus, multiple loose BMs.  Mild dyspnea when walking, leukocytosis.  CT chest and abdomen.   10/3: 2nd drain placed in IR for abd fluid collection  10/5: Invanz started for intraabdominal abscess. 10/6: tolerating FLD, advancing to soft diet. TPN wean 10/7: TPN stopped 10/9: resume TPN for recurrent ileus, place NGT to suction.  Today 10/10:  Glucose - controlled, CBGs < 150  Electrolytes - Na improved  to WNL.  Others remain WNL.    Renal - SCr decreased to 1.19  LFTs - WNL  TGs - WNL (9/30, 10/5, 10/8, 10/9)  Prealbumin - 13.9 (9/30),  14.4 (10/5),  14.7 (10/9)  NUTRITIONAL GOALS                                                                                    RD recs: 1950-2250 Kcal, 120-140g Protein, 1.9-2.2 L/day fluid Clinimix 5/15 at a goal rate of 110500ml/hr + 20% fat emulsion at ml/hr to provide: 120 g/day protein, 2184 Kcal/day.  PLAN                                                                                                                  Continue Clinimix E5/15, advance to 5380ml/hr   Continue 20% fat emulsion at 1210ml/hr.  Advance to goal as tolerates  Standard multivitamins and trace elements added to TPN  Continue CBGs and sensitive scale SSI q6h    TNA lab panels on Mondays & Thursdays.  BMET in am   Lynann Beaverhristine Korvin Valentine PharmD, BCPS Pager [phone] 07/19/2014 10:21 AM

## 2014-07-20 LAB — URINE CULTURE
Colony Count: 60000
SPECIAL REQUESTS: NORMAL

## 2014-07-20 LAB — CULTURE, ROUTINE-ABSCESS

## 2014-07-20 LAB — BASIC METABOLIC PANEL
Anion gap: 12 (ref 5–15)
BUN: 14 mg/dL (ref 6–23)
CHLORIDE: 102 meq/L (ref 96–112)
CO2: 24 meq/L (ref 19–32)
CREATININE: 1.06 mg/dL (ref 0.50–1.35)
Calcium: 8.8 mg/dL (ref 8.4–10.5)
GFR calc Af Amer: >90 mL/min (ref >90)
GFR calc non Af Amer: 82 mL/min — ABNORMAL LOW (ref >90)
GLUCOSE: 124 mg/dL — AB (ref 70–99)
Potassium: 4 mEq/L (ref 3.7–5.3)
SODIUM: 138 meq/L (ref 137–147)

## 2014-07-20 LAB — GLUCOSE, CAPILLARY
Glucose-Capillary: 120 mg/dL — ABNORMAL HIGH (ref 70–99)
Glucose-Capillary: 209 mg/dL — ABNORMAL HIGH (ref 70–99)
Glucose-Capillary: 131 mg/dL — ABNORMAL HIGH (ref 70–99)

## 2014-07-20 MED ORDER — HYDROCHLOROTHIAZIDE 10 MG/ML ORAL SUSPENSION
6.2500 mg | Freq: Every day | ORAL | Status: DC
  Administered 2014-07-20 – 2014-07-27 (×4): 6.25 mg via ORAL
  Filled 2014-07-20 (×11): qty 1.25

## 2014-07-20 MED ORDER — CLINIMIX E/DEXTROSE (5/15) 5 % IV SOLN
INTRAVENOUS | Status: AC
  Administered 2014-07-20: 17:00:00 via INTRAVENOUS
  Filled 2014-07-20: qty 2400

## 2014-07-20 MED ORDER — FAT EMULSION 20 % IV EMUL
250.0000 mL | INTRAVENOUS | Status: AC
  Administered 2014-07-20: 250 mL via INTRAVENOUS
  Filled 2014-07-20: qty 250

## 2014-07-20 MED ORDER — SACCHAROMYCES BOULARDII 250 MG PO CAPS
250.0000 mg | ORAL_CAPSULE | Freq: Two times a day (BID) | ORAL | Status: DC
  Administered 2014-07-20 – 2014-07-27 (×14): 250 mg via ORAL
  Filled 2014-07-20 (×18): qty 1

## 2014-07-20 MED ORDER — ACETAMINOPHEN 500 MG PO TABS
1000.0000 mg | ORAL_TABLET | Freq: Three times a day (TID) | ORAL | Status: DC
  Administered 2014-07-20 – 2014-07-27 (×17): 1000 mg via ORAL
  Filled 2014-07-20 (×27): qty 2

## 2014-07-20 MED ORDER — DIPHENHYDRAMINE HCL 25 MG PO TABS: 25.0000 mg | ORAL_TABLET | Freq: Four times a day (QID) | ORAL | Status: DC | PRN

## 2014-07-20 MED ORDER — BISMUTH SUBSALICYLATE 262 MG/15ML PO SUSP
30.0000 mL | Freq: Three times a day (TID) | ORAL | Status: DC | PRN
  Filled 2014-07-20: qty 236

## 2014-07-20 MED ORDER — COLCHICINE 0.6 MG PO TABS
0.6000 mg | ORAL_TABLET | Freq: Two times a day (BID) | ORAL | Status: DC | PRN
  Filled 2014-07-20: qty 1

## 2014-07-20 MED ORDER — LOSARTAN POTASSIUM 25 MG PO TABS
25.0000 mg | ORAL_TABLET | Freq: Every day | ORAL | Status: DC
  Administered 2014-07-21 – 2014-07-27 (×6): 25 mg via ORAL
  Filled 2014-07-20 (×11): qty 1

## 2014-07-20 MED ORDER — DIPHENHYDRAMINE HCL 25 MG PO CAPS: 25.0000 mg | ORAL_CAPSULE | Freq: Four times a day (QID) | ORAL | Status: DC | PRN

## 2014-07-20 MED ORDER — SODIUM CHLORIDE 0.9 % IV SOLN
500.0000 mg | Freq: Four times a day (QID) | INTRAVENOUS | Status: DC
  Administered 2014-07-21 – 2014-07-25 (×20): 500 mg via INTRAVENOUS
  Filled 2014-07-20 (×22): qty 500

## 2014-07-20 MED ORDER — ZOLPIDEM TARTRATE 10 MG PO TABS: 10.0000 mg | ORAL_TABLET | Freq: Every evening | ORAL | Status: DC | PRN

## 2014-07-20 MED ORDER — ONDANSETRON 4 MG PO TBDP
4.0000 mg | ORAL_TABLET | ORAL | Status: DC | PRN
  Administered 2014-07-21: 4 mg via ORAL
  Filled 2014-07-20: qty 1

## 2014-07-20 MED ORDER — LOSARTAN POTASSIUM-HCTZ 50-12.5 MG PO TABS: 0.5000 | ORAL_TABLET | Freq: Every day | ORAL | Status: DC

## 2014-07-20 NOTE — Progress Notes (Signed)
Pt's NG put out about 350 cc bile drainage last night. Will continue to monitor.

## 2014-07-20 NOTE — Progress Notes (Signed)
According to day nurse, pt c/o feeling bloated and full, so she hooked pt's NGT to suction. Will continue to monitor.

## 2014-07-20 NOTE — Progress Notes (Signed)
Pt c/o nausea, so continued to keep his NGT to suction.

## 2014-07-20 NOTE — Progress Notes (Signed)
[facility]  [address] [place], [place] [address] Phone: [phone] FAX: [phone]    [name] [name] [id number] 1966-04-09  CARE TEAM:  PCP: Geoffery Lyons, MD  Outpatient Care Team: Patient Care Team: Geoffery Lyons, MD as PCP - General (Internal Medicine)  Inpatient Treatment Team: Treatment Team: Attending Provider: Michael Boston, MD; Registered Nurse: Rise Patience, RN; Registered Nurse: Kristopher Oppenheim, RN; Dietitian: Toribio Harbour, RD; Technician: Leda Quail, NT; Registered Nurse: Metta Clines, RN; Consulting Physician: Nolon Nations, MD   Subjective:  Patient in better spirits.  Having some flatus.  Denies much abdominal pain.  Family in room.  Nurse Nevin Bloodgood in room.  Objective:  Vital signs:  Filed Vitals:   07/19/14 0600 07/19/14 1428 07/19/14 2156 07/20/14 0642  BP: 130/77 130/81 137/83 139/80  Pulse: 81 81 76 78  Temp: 98.7 F (37.1 C) 98.2 F (36.8 C) 97.9 F (36.6 C) 99.4 F (37.4 C)  TempSrc: Oral Oral Oral Oral  Resp: _0 Height:      Weight:      SpO2: 97% 98% 99% 97%    Last BM Date: 07/18/14  Intake/Output   Yesterday:  10/10 0701 - 10/11 0700 In: 4001.7 [P.O.:30; I.V.:1750.7; NG/GT:120; IV Piggyback:900; TPN:1080] Out: 630 [Emesis/NG output:630] This shift:     Bowel function:  Flatus: y  BM: small loose  Drain: thin NGT output  Physical Exam:  General: Pt awake/alert/oriented x4 in no acute distress Eyes: PERRL, normal EOM.  Sclera clear.  No icterus Neuro: CN II-XII intact w/o focal sensory/motor deficits. Lymph: No head/neck/groin lymphadenopathy Psych:  No delerium/psychosis/paranoia HENT: Normocephalic, Mucus membranes moist.  No thrush Neck: Supple, No tracheal deviation Chest: No chest wall pain w good excursion CV:  Pulses intact.  Regular rhythm MS: Normal AROM mjr joints.  No obvious deformity Abdomen: Soft.  Mildly  distended.  Incision clean/dry/intact.  Mildly tender at incisions only.  No evidence of peritonitis.  No incarcerated hernias. Ext:  SCDs BLE.  No mjr edema.  No cyanosis Skin: No petechiae / purpura   Problem List:   Principal Problem:   Diverticulitis of left colon s/p lap assisted subtotal colectomy 07/01/2014 Active Problems:   Sarcoidosis   Hypertension   Postoperative intra-abdominal abscess - ESBL resistent Ecoli   Superficial venous thrombosis of arm   Pancreatitis, acute   Protein-calorie malnutrition, moderate   Assessment  [name] [name]  48 y.o. male  67 Days Post-Op  Procedure(s): PREOPERATIVE DX: Recurrent sigmoid diverticulitis  POSTOPERATIVE DX: Same  PROCEDURE:laparoscopic-assisted subtotal colectomy with partial omentectomy, incidental appendectomy, rigid proctosigmoidoscopy.  Surgeon: Odis Hollingshead  Assistants: Neysa Bonito, M.D.   Prolonged possible postoperative ileus most likely due to postoperative abscess near pancreas.  Suspect had pancreatitis.  Slowly improving.  Plan:  Clamp NG tube trail.  Trial of thin liquids.  Continue IV TNA nutrition.  Continue aggressive IV antibiotics for now x 5 days minimum.  Improving clinically & lower white count hopeful sign.  F/u cultures  Heat/elevation for RUE  superficial DVT near PICC - consider removal & new PICC in LUE if WBC not better or pt develops pain/swelling in RUE  Mild ARF resolving - follow  HTN control  VTE prophylaxis- SCDs, etc  Mobilize as tolerated to help recovery - Get him up  [name], M.D., F.A.C.S. Gastrointestinal and Minimally Invasive Surgery [facility] [address] [place], [place]  [address] [phone] Main / Paging   07/20/2014   Results:   Labs: Results for orders placed during the hospital encounter of 07/01/14 (from the past 48 hour(s))  CULTURE, BLOOD (ROUTINE X 2)     Status: None   Collection Time     07/18/14  8:59 AM      Result Value Ref Range   Specimen Description BLOOD LEFT ARM     Special Requests BOTTLES DRAWN AEROBIC AND ANAEROBIC 10CC EA     Culture  Setup Time       Value: 07/18/2014 12:19     Performed at [facility]   Culture       Value:        BLOOD CULTURE RECEIVED NO GROWTH TO DATE CULTURE WILL BE HELD FOR 5 DAYS BEFORE ISSUING A FINAL NEGATIVE REPORT     Performed at [facility]   Report Status PENDING    CULTURE, BLOOD (ROUTINE X 2)     Status: None   Collection Time    07/18/14  9:03 AM      Result Value Ref Range   Specimen Description BLOOD LEFT ARM     Special Requests BOTTLES DRAWN AEROBIC AND ANAEROBIC 10CC EA     Culture  Setup Time       Value: 07/18/2014 12:19     Performed at [facility]   Culture       Value:        BLOOD CULTURE RECEIVED NO GROWTH TO DATE CULTURE WILL BE HELD FOR 5 DAYS BEFORE ISSUING A FINAL NEGATIVE REPORT     Performed at [facility]   Report Status PENDING    CULTURE, ROUTINE-ABSCESS     Status: None   Collection Time    07/18/14 11:03 AM      Result Value Ref Range   Specimen Description ABDOMEN JPD     Special Requests NONE     Gram Stain       Value: RARE WBC PRESENT, PREDOMINANTLY PMN     NO SQUAMOUS EPITHELIAL CELLS SEEN     MODERATE GRAM NEGATIVE RODS     Performed at [facility]   Culture       Value: ABUNDANT ESCHERICHIA COLI     Performed at [facility]   Report Status PENDING    GLUCOSE, CAPILLARY     Status: Abnormal   Collection Time    07/18/14 11:58 AM      Result Value Ref Range   Glucose-Capillary 138 (*) 70 - 99 mg/dL  BASIC METABOLIC PANEL     Status: Abnormal   Collection Time    07/18/14 12:44 PM      Result Value Ref Range   Sodium 134 (*) 137 - 147 mEq/L   Potassium 5.0  3.7 - 5.3 mEq/L   Chloride 97  96 - 112 mEq/L   CO2 22  19 - 32 mEq/L   Glucose, Bld 145 (*) 70 - 99 mg/dL   BUN 25 (*) 6 - 23 mg/dL   Creatinine, Ser 1.31  0.50 -  1.35 mg/dL   Calcium 9.1  8.4 - 10.5 mg/dL   GFR calc non Af Amer 64 (*) >90 mL/min   GFR calc Af Amer 74 (*) >90 mL/min   Comment: (NOTE)     The eGFR has been calculated using the CKD EPI equation.     This calculation has not been validated in all clinical  situations.     eGFR's persistently <90 mL/min signify possible Chronic Kidney     Disease.   Anion gap 15  5 - 15  PHOSPHORUS     Status: None   Collection Time    07/18/14 12:44 PM      Result Value Ref Range   Phosphorus 3.5  2.3 - 4.6 mg/dL  GLUCOSE, CAPILLARY     Status: Abnormal   Collection Time    07/18/14  5:38 PM      Result Value Ref Range   Glucose-Capillary 130 (*) 70 - 99 mg/dL  URINE CULTURE     Status: None   Collection Time    07/18/14  9:04 PM      Result Value Ref Range   Specimen Description URINE, CLEAN CATCH     Special Requests Normal     Culture  Setup Time       Value: 07/19/2014 00:25     Performed at [facility]       Value: 60,000 COLONIES/ML     Performed at [facility]   Culture       Value: YEAST     Performed at [facility]   Report Status 07/20/2014 FINAL    GLUCOSE, CAPILLARY     Status: Abnormal   Collection Time    07/19/14 12:19 AM      Result Value Ref Range   Glucose-Capillary 107 (*) 70 - 99 mg/dL  COMPREHENSIVE METABOLIC PANEL     Status: Abnormal   Collection Time    07/19/14  4:20 AM      Result Value Ref Range   Sodium 137  137 - 147 mEq/L   Potassium 4.2  3.7 - 5.3 mEq/L   Chloride 101  96 - 112 mEq/L   CO2 25  19 - 32 mEq/L   Glucose, Bld 134 (*) 70 - 99 mg/dL   BUN 21  6 - 23 mg/dL   Creatinine, Ser 1.19  0.50 - 1.35 mg/dL   Calcium 8.8  8.4 - 10.5 mg/dL   Total Protein 6.9  6.0 - 8.3 g/dL   Albumin 2.5 (*) 3.5 - 5.2 g/dL   AST 12  0 - 37 U/L   ALT 16  0 - 53 U/L   Alkaline Phosphatase 89  39 - 117 U/L   Total Bilirubin 0.3  0.3 - 1.2 mg/dL   GFR calc non Af Amer 72 (*) >90 mL/min   GFR calc Af Amer 83 (*) >90  mL/min   Comment: (NOTE)     The eGFR has been calculated using the CKD EPI equation.     This calculation has not been validated in all clinical situations.     eGFR's persistently <90 mL/min signify possible Chronic Kidney     Disease.   Anion gap 11  5 - 15  MAGNESIUM     Status: None   Collection Time    07/19/14  4:20 AM      Result Value Ref Range   Magnesium 2.3  1.5 - 2.5 mg/dL  PHOSPHORUS     Status: None   Collection Time    07/19/14  4:20 AM      Result Value Ref Range   Phosphorus 3.4  2.3 - 4.6 mg/dL  CBC     Status: Abnormal   Collection Time    07/19/14  4:20 AM      Result Value Ref Range  WBC 19.4 (*) 4.0 - 10.5 K/uL   RBC 3.22 (*) 4.22 - 5.81 MIL/uL   Hemoglobin 9.3 (*) 13.0 - 17.0 g/dL   HCT 28.3 (*) 39.0 - 52.0 %   MCV 87.9  78.0 - 100.0 fL   MCH 28.9  26.0 - 34.0 pg   MCHC 32.9  30.0 - 36.0 g/dL   RDW 13.8  11.5 - 15.5 %   Platelets 776 (*) 150 - 400 K/uL  DIFFERENTIAL     Status: Abnormal   Collection Time    07/19/14  4:20 AM      Result Value Ref Range   Neutrophils Relative % 83 (*) 43 - 77 %   Lymphocytes Relative 7 (*) 12 - 46 %   Monocytes Relative 9  3 - 12 %   Eosinophils Relative 1  0 - 5 %   Basophils Relative 0  0 - 1 %   Neutro Abs 16.1 (*) 1.7 - 7.7 K/uL   Lymphs Abs 1.4  0.7 - 4.0 K/uL   Monocytes Absolute 1.7 (*) 0.1 - 1.0 K/uL   Eosinophils Absolute 0.2  0.0 - 0.7 K/uL   Basophils Absolute 0.0  0.0 - 0.1 K/uL  GLUCOSE, CAPILLARY     Status: Abnormal   Collection Time    07/19/14  6:36 AM      Result Value Ref Range   Glucose-Capillary 120 (*) 70 - 99 mg/dL  GLUCOSE, CAPILLARY     Status: Abnormal   Collection Time    07/19/14 12:31 PM      Result Value Ref Range   Glucose-Capillary 152 (*) 70 - 99 mg/dL  GLUCOSE, CAPILLARY     Status: Abnormal   Collection Time    07/19/14  6:47 PM      Result Value Ref Range   Glucose-Capillary 113 (*) 70 - 99 mg/dL  GLUCOSE, CAPILLARY     Status: None   Collection Time    07/19/14  11:46 PM      Result Value Ref Range   Glucose-Capillary 91  70 - 99 mg/dL   Comment 1 Notify RN    BASIC METABOLIC PANEL     Status: Abnormal   Collection Time    07/20/14  4:21 AM      Result Value Ref Range   Sodium 138  137 - 147 mEq/L   Potassium 4.0  3.7 - 5.3 mEq/L   Chloride 102  96 - 112 mEq/L   CO2 24  19 - 32 mEq/L   Glucose, Bld 124 (*) 70 - 99 mg/dL   BUN 14  6 - 23 mg/dL   Creatinine, Ser 1.06  0.50 - 1.35 mg/dL   Calcium 8.8  8.4 - 10.5 mg/dL   GFR calc non Af Amer 82 (*) >90 mL/min   GFR calc Af Amer >90  >90 mL/min   Comment: (NOTE)     The eGFR has been calculated using the CKD EPI equation.     This calculation has not been validated in all clinical situations.     eGFR's persistently <90 mL/min signify possible Chronic Kidney     Disease.   Anion gap 12  5 - 15    Imaging / Studies: Dg Abd 2 Views  07/18/2014   CLINICAL DATA:  48 year old male with recent subtotal colectomy. Abdominal pain nausea and vomiting. Initial encounter.  EXAM: ABDOMEN - 2 VIEW  COMPARISON:  07/17/2014 and earlier.  FINDINGS: Enteric tube now in place, side  hole projects at the level of the gastric body. Stable left upper abdomen percutaneous pigtail drainage catheter. Continued diffuse small bowel distention with air-fluid levels. Small bowel loops measure up to 48 mm diameter. There is less distal large bowel gas today. Stable visualized osseous structures. Stable lung bases, with atelectasis greater on the left. Partially visible right PICC line. No pneumoperitoneum.  IMPRESSION: 1. Enteric tube placed, side hole the level of the gastric body. 2. No improvement in abnormal bowel gas pattern previously most compatible with diffuse ileus. There is less distal colon gas today, recommend continued radiographic follow-up. 3. No pneumoperitoneum.   Electronically Signed   By: Lars Pinks M.D.   On: 07/18/2014 15:56    Medications / Allergies: per chart  Antibiotics: Anti-infectives   Start      Dose/Rate Route Frequency Ordered Stop   07/18/14 1100  micafungin (MYCAMINE) 150 mg in sodium chloride 0.9 % 100 mL IVPB     150 mg 100 mL/hr over 1 Hours Intravenous Every 24 hours 07/18/14 0907     07/18/14 1000  linezolid (ZYVOX) IVPB 600 mg     600 mg 300 mL/hr over 60 Minutes Intravenous Every 12 hours 07/18/14 0907     07/17/14 2000  imipenem-cilastatin (PRIMAXIN) 500 mg in sodium chloride 0.9 % 100 mL IVPB     500 mg 200 mL/hr over 30 Minutes Intravenous Every 6 hours 07/17/14 1743     07/14/14 1600  ertapenem (INVANZ) 1 g in sodium chloride 0.9 % 50 mL IVPB  Status:  Discontinued     1 g 100 mL/hr over 30 Minutes Intravenous Every 24 hours 07/14/14 1457 07/17/14 1743   07/14/14 0800  piperacillin-tazobactam (ZOSYN) IVPB 3.375 g  Status:  Discontinued     3.375 g 12.5 mL/hr over 240 Minutes Intravenous Every 8 hours 07/14/14 0723 07/14/14 1456   07/08/14 2100  vancomycin (VANCOCIN) IVPB 1000 mg/200 mL premix  Status:  Discontinued     1,000 mg 200 mL/hr over 60 Minutes Intravenous Every 12 hours 07/08/14 1104 07/10/14 0832   07/08/14 0800  vancomycin (VANCOCIN) 2,000 mg in sodium chloride 0.9 % 500 mL IVPB     2,000 mg 250 mL/hr over 120 Minutes Intravenous  Once 07/08/14 0757 07/08/14 1107   07/07/14 1000  piperacillin-tazobactam (ZOSYN) IVPB 3.375 g  Status:  Discontinued     3.375 g 12.5 mL/hr over 240 Minutes Intravenous Every 8 hours 07/07/14 0821 07/10/14 0832   07/01/14 2000  cefoTEtan (CEFOTAN) 2 g in dextrose 5 % 50 mL IVPB     2 g 100 mL/hr over 30 Minutes Intravenous Every 12 hours 07/01/14 1339 07/01/14 2015   07/01/14 0625  cefoTEtan in Dextrose 5% (CEFOTAN) IVPB 2 g  Status:  Discontinued     2 g Intravenous Every 12 hours 07/01/14 0625 07/01/14 1413       Note: Portions of this report may have been transcribed using voice recognition software. Every effort was made to ensure accuracy; however, inadvertent computerized transcription errors may be present.    Any transcriptional errors that result from this process are unintentional.

## 2014-07-20 NOTE — Progress Notes (Addendum)
PARENTERAL NUTRITION CONSULT NOTE  Pharmacy Consult for TNA Indication: Prolonged postoperative ileus  Allergies  Allergen Reactions  . Celebrex [Celecoxib] Other (See Comments)    "Mom is allergic, so I do not take it"  . Sulfa Antibiotics Nausea And Vomiting  . Codeine Rash  . Dilaudid [Hydromorphone Hcl] Nausea And Vomiting and Anxiety   Patient Measurements: Height: 6' (182.9 cm) Weight: 227 lb 15.3 oz (103.4 kg) IBW/kg (Calculated) : 77.6  Vital Signs: Temp: 99.4 F (37.4 C) (10/11 16100642) Temp Source: Oral (10/11 0642) BP: 139/80 mmHg (10/11 0642) Pulse Rate: 78 (10/11 0642) Intake/Output from previous day: 10/10 0701 - 10/11 0700 In: 4001.7 [P.O.:30; I.V.:1750.7; NG/GT:120; IV Piggyback:900; TPN:1080] Out: 630 [Emesis/NG output:630] Intake/Output from this shift:   Labs:  Recent Labs  07/18/14 0430 07/19/14 0420  WBC 27.1* 19.4*  HGB 10.5* 9.3*  HCT 31.1* 28.3*  PLT 859* 776*    Recent Labs  07/18/14 0430 07/18/14 1244 07/19/14 0420 07/20/14 0421  NA 133* 134* 137 138  K 4.8 5.0 4.2 4.0  CL 95* 97 101 102  CO2 22 22 25 24   GLUCOSE 135* 145* 134* 124*  BUN 28* 25* 21 14  CREATININE 1.37* 1.31 1.19 1.06  CALCIUM 9.3 9.1 8.8 8.8  MG 2.3  --  2.3  --   PHOS 5.0* 3.5 3.4  --   PROT 7.8  --  6.9  --   ALBUMIN 2.7*  --  2.5*  --   AST 17  --  12  --   ALT 24  --  16  --   ALKPHOS 101  --  89  --   BILITOT 0.5  --  0.3  --   PREALBUMIN 14.7*  --   --   --   TRIG 84  --   --   --    Estimated Creatinine Clearance: 108.3 ml/min (by C-G formula based on Cr of 1.06).    Recent Labs  07/19/14 1231 07/19/14 1847 07/19/14 2346  GLUCAP 152* 113* 91   Insulin Requirements in past 24 hrs: sensitive SSI q6h: 2 units / 24 hrs.  Current Nutrition: Thin liquids (10/11)  IVF: NS at 10 ml/hr  Central access: PICC placed 9/29 TPN start date: 9/29  ASSESSMENT                                                                                                        HPI:  [age] yoM with recurrent sigmoid diverticulitis. S/p lap assisted subtotal colectomy on 9/22 with subsequent postop ileus. Initially, TPN was started 9/27 and stopped 10/7 when he was tolerating an enteral diet.  Pharmacy was consulted to resume TPN on 10/9 following recurrent ileus on 10/8.    Significant events:  9/27: Start TPN 10/2: Passing flatus, multiple loose BMs.  CT w/ fluid collection. 10/6: tolerating FLD, advancing to soft diet. TPN wean 10/7: TPN stopped 10/9: resume TPN for recurrent ileus, NGT to suction 10/11: NG clamping trial, starting thin liquids  Today 10/10:  Glucose - controlled, CBGs < 150  Electrolytes - remain WNL.    Renal - SCr decreased to 1.06  LFTs - WNL (10/10)  TGs - WNL (9/30, 10/5, 10/8, 10/9)  Prealbumin - 13.9 (9/30),  14.4 (10/5),  14.7 (10/9)  NUTRITIONAL GOALS                                                                                    RD recs: 1950-2250 Kcal, 120-140g Protein, 1.9-2.2 L/day fluid Clinimix 5/15 at a goal rate of 16100ml/hr + 20% fat emulsion at ml/hr to provide: 120 g/day protein, 2184 Kcal/day.  PLAN                                                                                                                  Continue Clinimix E 5/15, advance to 13600ml/hr   Continue 20% fat emulsion at 10410ml/hr.  Follow up advancement of diet.  Standard multivitamins and trace elements added to TPN  Continue CBGs and sensitive scale SSI q6h    IVF per MD orders  TNA lab panels on Mondays & Thursdays.  Follow up central access - currently has superficial DVT near PICC and may need to be replaced.   Lynann Beaverhristine Tommy Minichiello PharmD, BCPS Pager [phone] 07/20/2014 8:32 AM

## 2014-07-20 NOTE — Progress Notes (Addendum)
CRITICAL VALUE ALERT  Critical value received:  E.Coli and confirmed ESBL- found in drainage from [facility] PimpleJackson Pratt. [facility] by [employer] Corporation[employer]y McNicholson.  Date of notification:  07/20/14  Time of notification:  0852  Critical value read back: yes  Nurse who received alert:  Alda BertholdPaula Macarthur Lorusso, RN  MD notified (1st page):   Time of first page:    MD notified (2nd page):  Time of second page:  Responding MD:  Dr. Michaell CowingGross aware on rounds to unit.  Time MD responded:  0900    No new orders received. No action taken at this time.

## 2014-07-21 DIAGNOSIS — E440 Moderate protein-calorie malnutrition: Secondary | ICD-10-CM

## 2014-07-21 DIAGNOSIS — I82619 Acute embolism and thrombosis of superficial veins of unspecified upper extremity: Secondary | ICD-10-CM

## 2014-07-21 DIAGNOSIS — I10 Essential (primary) hypertension: Secondary | ICD-10-CM

## 2014-07-21 DIAGNOSIS — K859 Acute pancreatitis, unspecified: Secondary | ICD-10-CM

## 2014-07-21 DIAGNOSIS — D869 Sarcoidosis, unspecified: Secondary | ICD-10-CM

## 2014-07-21 LAB — CBC
HCT: 27.5 % — ABNORMAL LOW (ref 39.0–52.0)
Hemoglobin: 9 g/dL — ABNORMAL LOW (ref 13.0–17.0)
MCH: 28.7 pg (ref 26.0–34.0)
MCHC: 32.7 g/dL (ref 30.0–36.0)
MCV: 87.6 fL (ref 78.0–100.0)
PLATELETS: 673 10*3/uL — AB (ref 150–400)
RBC: 3.14 MIL/uL — AB (ref 4.22–5.81)
RDW: 14 % (ref 11.5–15.5)
WBC: 22 10*3/uL — AB (ref 4.0–10.5)

## 2014-07-21 LAB — COMPREHENSIVE METABOLIC PANEL
ALBUMIN: 2.5 g/dL — AB (ref 3.5–5.2)
ALT: 13 U/L (ref 0–53)
ANION GAP: 11 (ref 5–15)
AST: 16 U/L (ref 0–37)
Alkaline Phosphatase: 80 U/L (ref 39–117)
BUN: 15 mg/dL (ref 6–23)
CALCIUM: 9.1 mg/dL (ref 8.4–10.5)
CO2: 25 mEq/L (ref 19–32)
CREATININE: 1.02 mg/dL (ref 0.50–1.35)
Chloride: 96 mEq/L (ref 96–112)
GFR calc Af Amer: >90 mL/min (ref >90)
GFR calc non Af Amer: 86 mL/min — ABNORMAL LOW (ref >90)
Glucose, Bld: 132 mg/dL — ABNORMAL HIGH (ref 70–99)
Potassium: 4 mEq/L (ref 3.7–5.3)
Sodium: 132 mEq/L — ABNORMAL LOW (ref 137–147)
Total Bilirubin: 0.3 mg/dL (ref 0.3–1.2)
Total Protein: 7.1 g/dL (ref 6.0–8.3)

## 2014-07-21 LAB — DIFFERENTIAL
Basophils Absolute: 0 10*3/uL (ref 0.0–0.1)
Basophils Relative: 0 % (ref 0–1)
EOS PCT: 1 % (ref 0–5)
Eosinophils Absolute: 0.2 10*3/uL (ref 0.0–0.7)
Lymphocytes Relative: 7 % — ABNORMAL LOW (ref 12–46)
Lymphs Abs: 1.5 10*3/uL (ref 0.7–4.0)
MONOS PCT: 8 % (ref 3–12)
Monocytes Absolute: 1.8 10*3/uL — ABNORMAL HIGH (ref 0.1–1.0)
NEUTROS PCT: 84 % — AB (ref 43–77)
Neutro Abs: 18.5 10*3/uL — ABNORMAL HIGH (ref 1.7–7.7)

## 2014-07-21 LAB — GLUCOSE, CAPILLARY
Glucose-Capillary: 110 mg/dL — ABNORMAL HIGH (ref 70–99)
Glucose-Capillary: 115 mg/dL — ABNORMAL HIGH (ref 70–99)
Glucose-Capillary: 119 mg/dL — ABNORMAL HIGH (ref 70–99)
Glucose-Capillary: 131 mg/dL — ABNORMAL HIGH (ref 70–99)
Glucose-Capillary: 177 mg/dL — ABNORMAL HIGH (ref 70–99)

## 2014-07-21 LAB — MAGNESIUM: MAGNESIUM: 2.1 mg/dL (ref 1.5–2.5)

## 2014-07-21 LAB — TRIGLYCERIDES: TRIGLYCERIDES: 58 mg/dL (ref <150)

## 2014-07-21 LAB — PREALBUMIN: Prealbumin: 13.5 mg/dL — ABNORMAL LOW (ref 17.0–34.0)

## 2014-07-21 LAB — PHOSPHORUS: PHOSPHORUS: 3.9 mg/dL (ref 2.3–4.6)

## 2014-07-21 MED ORDER — FAT EMULSION 20 % IV EMUL
250.0000 mL | INTRAVENOUS | Status: AC
  Administered 2014-07-21: 250 mL via INTRAVENOUS
  Filled 2014-07-21: qty 250

## 2014-07-21 MED ORDER — TRACE MINERALS CR-CU-F-FE-I-MN-MO-SE-ZN IV SOLN
INTRAVENOUS | Status: AC
  Administered 2014-07-21: 17:00:00 via INTRAVENOUS
  Filled 2014-07-21: qty 2400

## 2014-07-21 MED ORDER — SIMETHICONE 80 MG PO CHEW
80.0000 mg | CHEWABLE_TABLET | Freq: Two times a day (BID) | ORAL | Status: AC
Start: 2014-07-21 — End: 2014-07-22
  Administered 2014-07-21 – 2014-07-22 (×4): 80 mg via ORAL
  Filled 2014-07-21 (×5): qty 1

## 2014-07-21 MED ORDER — INSULIN ASPART 100 UNIT/ML ~~LOC~~ SOLN
0.0000 [IU] | Freq: Three times a day (TID) | SUBCUTANEOUS | Status: DC
  Administered 2014-07-21: 18:00:00 via SUBCUTANEOUS
  Administered 2014-07-22 – 2014-07-25 (×2): 1 [IU] via SUBCUTANEOUS

## 2014-07-21 MED ORDER — INSULIN ASPART 100 UNIT/ML ~~LOC~~ SOLN: 0.0000 [IU] | Freq: Every day | SUBCUTANEOUS | Status: DC

## 2014-07-21 MED ORDER — ENSURE COMPLETE PO LIQD
237.0000 mL | Freq: Three times a day (TID) | ORAL | Status: DC
  Administered 2014-07-22: 237 mL via ORAL

## 2014-07-21 NOTE — Progress Notes (Signed)
PARENTERAL NUTRITION CONSULT NOTE  Pharmacy Consult for TNA Indication: Prolonged postoperative ileus  Allergies  Allergen Reactions  . Celebrex [Celecoxib] Other (See Comments)    "Mom is allergic, so I do not take it"  . Sulfa Antibiotics Nausea And Vomiting  . Codeine Rash  . Dilaudid [Hydromorphone Hcl] Nausea And Vomiting and Anxiety   Patient Measurements: Height: 6' (182.9 cm) Weight: 227 lb 15.3 oz (103.4 kg) IBW/kg (Calculated) : 77.6  Vital Signs: Temp: 98.5 F (36.9 C) (10/12 0529) Temp Source: Oral (10/12 0529) BP: 120/64 mmHg (10/12 0529) Pulse Rate: 79 (10/12 0529) Intake/Output from previous day: 10/11 0701 - 10/12 0700 In: 485 [P.O.:480] Out: 0  Intake/Output from this shift:   Labs:  Recent Labs  07/19/14 0420 07/21/14 0334  WBC 19.4* 22.0*  HGB 9.3* 9.0*  HCT 28.3* 27.5*  PLT 776* 673*    Recent Labs  07/18/14 1244 07/19/14 0420 07/20/14 0421 07/21/14 0334  NA 134* 137 138 132*  K 5.0 4.2 4.0 4.0  CL 97 101 102 96  CO2 22 25 24 25   GLUCOSE 145* 134* 124* 132*  BUN 25* 21 14 15   CREATININE 1.31 1.19 1.06 1.02  CALCIUM 9.1 8.8 8.8 9.1  MG  --  2.3  --  2.1  PHOS 3.5 3.4  --  3.9  PROT  --  6.9  --  7.1  ALBUMIN  --  2.5*  --  2.5*  AST  --  12  --  16  ALT  --  16  --  13  ALKPHOS  --  89  --  80  BILITOT  --  0.3  --  0.3   Estimated Creatinine Clearance: 112.5 ml/min (by C-G formula based on Cr of 1.02).    Recent Labs  07/20/14 1805 07/21/14 0044 07/21/14 0519  GLUCAP 131* 115* 110*   Insulin Requirements in past 24 hrs: sensitive SSI q6h: 4 units   Current Nutrition: Thin liquids (10/11)  IVF: NS at 10 ml/hr  Central access: PICC placed 9/29 TPN start date: 9/29  ASSESSMENT                                                                                                       HPI:  [name] with recurrent sigmoid diverticulitis. S/p lap assisted subtotal colectomy on 9/22 with subsequent postop ileus. Initially, TPN  was started 9/27 and stopped 10/7 when he was tolerating an enteral diet.  Pharmacy was consulted to resume TPN on 10/9 following recurrent ileus on 10/8.    Significant events:  9/27: Start TPN 10/2: Passing flatus, multiple loose BMs.  CT w/ fluid collection. 10/6: tolerating FLD, advancing to soft diet. TPN wean 10/7: TPN stopped 10/9: resume TPN for recurrent ileus, NGT to suction 10/11: NG clamping trial, starting thin liquids - 480cc intake charted  Today 10/10:  Glucose - at goal of < 150 except one isolated value of 209 on 10/11  Electrolytes - Na slightly low, all others WNL  Renal - SCr improving daily  LFTs - WNL  TGs - WNL (9/30, 10/5, 10/8, 10/9), pending today  Prealbumin - 13.9 (9/30),  14.4 (10/5), 14.7 (10/9), pending today  NUTRITIONAL GOALS                                                                                    RD recs: 1950-2250 Kcal, 120-140g Protein, 1.9-2.2 L/day fluid Clinimix 5/15 at a goal rate of 17300ml/hr + 20% fat emulsion at ml/hr to provide: 120 g/day protein, 2184 Kcal/day.  PLAN                                                                                                                  Continue Clinimix E 5/15 at goal of 16500ml/hr   Continue 20% fat emulsion at 6010ml/hr.  Follow up advancement of diet.  Standard multivitamins and trace elements added to TPN  Continue CBGs and sensitive scale SSI q6h    IVF per MD orders  TNA lab panels on Mondays & Thursdays. BMET in AM 10/13.  Follow up central access - currently has superficial DVT near PICC and may need to be replaced.   [name] [name], PharmD, BCPS Pager: [phone]  07/21/2014 7:34 AM

## 2014-07-21 NOTE — Progress Notes (Signed)
Referring Physician(s): CCS  Subjective: Patient states he is doing ok, less nauseous today, no vomiting. He denies any worsening abdominal pain. He admits to (+) flatus and is going to order a soft diet tonight for the first time.   Allergies: Celebrex; Sulfa antibiotics; Codeine; and Dilaudid  Medications: Prior to Admission medications   Medication Sig Start Date End Date Taking? Authorizing Provider  diphenhydrAMINE (BENADRYL) 25 MG tablet Take 25 mg by mouth every 6 (six) hours as needed for allergies.    Yes Historical Provider, MD  ondansetron (ZOFRAN-ODT) 4 MG disintegrating tablet Take 4 mg by mouth every 4 (four) hours as needed for nausea or vomiting.   Yes Historical Provider, MD  zolpidem (AMBIEN) 10 MG tablet Take 10 mg by mouth at bedtime as needed for sleep.    Yes Historical Provider, MD  colchicine 0.6 MG tablet Take 0.6 mg by mouth 2 (two) times daily as needed (For gout.).  06/04/14   Historical Provider, MD  losartan-hydrochlorothiazide (HYZAAR) 50-12.5 MG per tablet Take 0.5 tablets by mouth at bedtime.  11/18/13   Historical Provider, MD  Probiotic Product (PROBIOTIC DAILY PO) Take 1 capsule by mouth at bedtime.    Historical Provider, MD    Review of Systems  Vital Signs: BP 120/64  Pulse 79  Temp(Src) 98.5 F (36.9 C) (Oral)  Resp 18  Ht 6' (1.829 m)  Wt 227 lb 15.3 oz (103.4 kg)  BMI 30.91 kg/m2  SpO2 98%  Physical Exam General: A&Ox3, lying bed  Abd: Distended, left RP drain intact with minimal cloudy output in bulb,  No output last 24 hours recorded.   Imaging: Dg Abd 2 Views  07/18/2014   CLINICAL DATA:  48 year old male with recent subtotal colectomy. Abdominal pain nausea and vomiting. Initial encounter.  EXAM: ABDOMEN - 2 VIEW  COMPARISON:  07/17/2014 and earlier.  FINDINGS: Enteric tube now in place, side hole projects at the level of the gastric body. Stable left upper abdomen percutaneous pigtail drainage catheter. Continued diffuse small  bowel distention with air-fluid levels. Small bowel loops measure up to 48 mm diameter. There is less distal large bowel gas today. Stable visualized osseous structures. Stable lung bases, with atelectasis greater on the left. Partially visible right PICC line. No pneumoperitoneum.  IMPRESSION: 1. Enteric tube placed, side hole the level of the gastric body. 2. No improvement in abnormal bowel gas pattern previously most compatible with diffuse ileus. There is less distal colon gas today, recommend continued radiographic follow-up. 3. No pneumoperitoneum.   Electronically Signed   By: Augusto GambleLee  Hall M.D.   On: 07/18/2014 15:56   Dg Abd Acute W/chest  07/17/2014   CLINICAL DATA:  Nausea vomiting. History of subtotal colectomy 2 weeks ago. Abdominal pain with nausea and vomiting. Per epic notes, the patient having bowel movements and passing gas.  EXAM: ACUTE ABDOMEN SERIES (ABDOMEN 2 VIEW & CHEST 1 VIEW)  COMPARISON:  CT abdomen pelvis 07/17/1999  FINDINGS: Right upper extremity PICC terminates at the junction of the superior vena cava and right atrium. Heart and mediastinal contours are normal. Lung volumes are slightly low with mild left basilar atelectasis. Negative for airspace disease or pleural effusion.  Pigtail drainage catheter projects over the left upper quadrant.  Diffuse gaseous distention of small bowel loops throughout the abdomen. Gas is seen in the rectum. Small bowel loops measure up to 5 cm diameter. There are multiple air-fluid levels within small bowel loops on the upright view. Negative for free  intraperitoneal air.  IMPRESSION: Diffuse gaseous distention of small bowel loops with multiple air-fluid levels (small bowel loops measure up to 5 cm). Gas is seen in the rectum. Findings are favored to be due to ileus, given the clinical history of the patient passing gas and having bowel movements.  No free intraperitoneal air.  Minimal left basilar atelectasis.   Electronically Signed   By: Britta MccreedySusan   Turner M.D.   On: 07/17/2014 19:04    Labs:  CBC:  Recent Labs  07/17/14 0500 07/18/14 0430 07/19/14 0420 07/21/14 0334  WBC 24.4* 27.1* 19.4* 22.0*  HGB 10.7* 10.5* 9.3* 9.0*  HCT 32.8* 31.1* 28.3* 27.5*  PLT 963* 859* 776* 673*    COAGS:  Recent Labs  06/25/14 1445 07/12/14 0508  INR 1.08 1.19  APTT  --  34    BMP:  Recent Labs  07/18/14 1244 07/19/14 0420 07/20/14 0421 07/21/14 0334  NA 134* 137 138 132*  K 5.0 4.2 4.0 4.0  CL 97 101 102 96  CO2 22 25 24 25   GLUCOSE 145* 134* 124* 132*  BUN 25* 21 14 15   CALCIUM 9.1 8.8 8.8 9.1  CREATININE 1.31 1.19 1.06 1.02  GFRNONAA 64* 72* 82* 86*  GFRAA 74* 83* >90 >90    LIVER FUNCTION TESTS:  Recent Labs  07/14/14 0500 07/18/14 0430 07/19/14 0420 07/21/14 0334  BILITOT 0.4 0.5 0.3 0.3  AST 20 17 12 16   ALT 37 24 16 13   ALKPHOS 86 101 89 80  PROT 6.9 7.8 6.9 7.1  ALBUMIN 2.5* 2.7* 2.5* 2.5*    Assessment and Plan: Recurrent sigmoid diverticulitis s/p subtotal colectomy  Post operative left RP abscess s/p drainage 10/3, switched to JP suction 10/5 Cx E. Coli ESBL, repeat Cx 10/9 E. Coli ESBL, ID on board  CT 10/7 shows decreased size of drained abscess, no additional abscesses  Persistent SB ileus, slowly improving NGT discontinued, IV TNA and advanced to soft diet today.  Leukocytosis, Would keep IR drain in for now given persistent wbc despite low to no output. Continue drain flushes.  Plans per ID/CCS     I spent a total of 15 minutes face to face in clinical consultation/evaluation, greater than 50% of which was counseling/coordinating care   Signed: Berneta LevinsMORGAN, Daleigh Pollinger D 07/21/2014, 4:08 PM

## 2014-07-21 NOTE — Progress Notes (Signed)
[facility]  Day # 3 imipenem Day #3 zyvox Day # 3 micafungin  4 day of Invanz  Subjective: Feels better without NG tube   Antibiotics:  Anti-infectives   Start     Dose/Rate Route Frequency Ordered Stop   07/20/14 2200  imipenem-cilastatin (PRIMAXIN) 500 mg in sodium chloride 0.9 % 100 mL IVPB     500 mg 200 mL/hr over 30 Minutes Intravenous Every 6 hours 07/20/14 1652     07/18/14 1100  micafungin (MYCAMINE) 150 mg in sodium chloride 0.9 % 100 mL IVPB     150 mg 100 mL/hr over 1 Hours Intravenous Every 24 hours 07/18/14 0907     07/18/14 1000  linezolid (ZYVOX) IVPB 600 mg     600 mg 300 mL/hr over 60 Minutes Intravenous Every 12 hours 07/18/14 0907     07/17/14 2000  imipenem-cilastatin (PRIMAXIN) 500 mg in sodium chloride 0.9 % 100 mL IVPB  Status:  Discontinued     500 mg 200 mL/hr over 30 Minutes Intravenous Every 6 hours 07/17/14 1743 07/20/14 1652   07/14/14 1600  ertapenem (INVANZ) 1 g in sodium chloride 0.9 % 50 mL IVPB  Status:  Discontinued     1 g 100 mL/hr over 30 Minutes Intravenous Every 24 hours 07/14/14 1457 07/17/14 1743   07/14/14 0800  piperacillin-tazobactam (ZOSYN) IVPB 3.375 g  Status:  Discontinued     3.375 g 12.5 mL/hr over 240 Minutes Intravenous Every 8 hours 07/14/14 0723 07/14/14 1456   07/08/14 2100  vancomycin (VANCOCIN) IVPB 1000 mg/200 mL premix  Status:  Discontinued     1,000 mg 200 mL/hr over 60 Minutes Intravenous Every 12 hours 07/08/14 1104 07/10/14 0832   07/08/14 0800  vancomycin (VANCOCIN) 2,000 mg in sodium chloride 0.9 % 500 mL IVPB     2,000 mg 250 mL/hr over 120 Minutes Intravenous  Once 07/08/14 0757 07/08/14 1107   07/07/14 1000  piperacillin-tazobactam (ZOSYN) IVPB 3.375 g  Status:  Discontinued     3.375 g 12.5 mL/hr over 240 Minutes Intravenous Every 8 hours 07/07/14 0821 07/10/14 0832   07/01/14 2000  cefoTEtan (CEFOTAN) 2 g in dextrose 5 % 50 mL IVPB     2 g 100 mL/hr over 30 Minutes  Intravenous Every 12 hours 07/01/14 1339 07/01/14 2015   07/01/14 0625  cefoTEtan in Dextrose 5% (CEFOTAN) IVPB 2 g  Status:  Discontinued     2 g Intravenous Every 12 hours 07/01/14 0625 07/01/14 1413      Medications: Scheduled Meds: . acetaminophen  1,000 mg Oral TID  . enoxaparin (LOVENOX) injection  50 mg Subcutaneous Q24H  . feeding supplement (ENSURE COMPLETE)  237 mL Oral TID WC  . losartan  25 mg Oral QHS   And  . hydrochlorothiazide  6.25 mg Oral QHS  . imipenem-cilastatin  500 mg Intravenous Q6H  . insulin aspart  0-5 Units Subcutaneous QHS  . insulin aspart  0-9 Units Subcutaneous TID WC  . linezolid  600 mg Intravenous Q12H  . lip balm  1 application Topical BID  . micafungin (MYCAMINE) IV  150 mg Intravenous Q24H  . saccharomyces boulardii  250 mg Oral BID  . simethicone  80 mg Oral BID   Continuous Infusions: . sodium chloride 10 mL/hr at 07/20/14 0053  . Marland Kitchen.TPN (CLINIMIX-E) Adult 100 mL/hr at 07/20/14 1718   And  . fat emulsion 250 mL (07/20/14 1718)  . Marland Kitchen.TPN (CLINIMIX-E) Adult  And  . fat emulsion     PRN Meds:.alum & mag hydroxide-simeth, bismuth subsalicylate, colchicine, diphenhydrAMINE, diphenhydrAMINE, LORazepam, magic mouthwash, menthol-cetylpyridinium, metoprolol, morphine injection, ondansetron (ZOFRAN) IV, ondansetron, ondansetron, phenol, promethazine, sodium chloride, zolpidem    Objective: Weight change:   Intake/Output Summary (Last 24 hours) at 07/21/14 1720 Last data filed at 07/21/14 1400  Gross per 24 hour  Intake   3885 ml  Output      7 ml  Net   3878 ml   Blood pressure 120/64, pulse 79, temperature 98.5 F (36.9 C), temperature source Oral, resp. rate 18, height 6' (1.829 m), weight 227 lb 15.3 oz (103.4 kg), SpO2 98.00%. Temp:  [98.5 F (36.9 C)-98.6 F (37 C)] 98.5 F (36.9 C) (10/12 0529) Pulse Rate:  [79-80] 79 (10/12 0529) Resp:  [17-18] 18 (10/12 0529) BP: (120-127)/(64-77) 120/64 mmHg (10/12 0529) SpO2:  [98 %-100 %]  98 % (10/12 0529)  Physical Exam: General: Alert and awake, oriented x3 HEENT: anicteric sclera, EOMI,  CVS regular rate, normal r, no murmur rubs or gallops  Chest: clear to auscultation bilaterally, no wheezing, rales or rhonchi  Abdomen: surgical sites are clean, JP drain is in place with less material in drain today, diffusely tender ,  Skin: no rashes , swelling in forearm on left Neuro: nonfocal, strength and sensation intact   CBC:  Recent Labs Lab 07/16/14 0830 07/17/14 0500 07/18/14 0430 07/19/14 0420 07/21/14 0334  HGB 11.0* 10.7* 10.5* 9.3* 9.0*  HCT 34.1* 32.8* 31.1* 28.3* 27.5*  PLT 989* 963* 859* 776* 673*     BMET  Recent Labs  07/20/14 0421 07/21/14 0334  NA 138 132*  K 4.0 4.0  CL 102 96  CO2 24 25  GLUCOSE 124* 132*  BUN 14 15  CREATININE 1.06 1.02  CALCIUM 8.8 9.1     Liver Panel   Recent Labs  07/19/14 0420 07/21/14 0334  PROT 6.9 7.1  ALBUMIN 2.5* 2.5*  AST 12 16  ALT 16 13  ALKPHOS 89 80  BILITOT 0.3 0.3       Sedimentation Rate No results found for this basename: ESRSEDRATE,  in the last 72 hours C-Reactive Protein No results found for this basename: CRP,  in the last 72 hours  Micro Results: Recent Results (from the past 720 hour(s))  CLOSTRIDIUM DIFFICILE BY PCR     Status: None   Collection Time    07/11/14  9:45 AM      Result Value Ref Range Status   C difficile by pcr NEGATIVE  NEGATIVE Final   Comment: Performed at [facility]  CULTURE, ROUTINE-ABSCESS     Status: None   Collection Time    07/12/14  8:53 AM      Result Value Ref Range Status   Specimen Description ABSCESS PERITONEAL   Final   Special Requests Normal   Final   Gram Stain     Final   Value: MODERATE WBC PRESENT, PREDOMINANTLY MONONUCLEAR     FEW WBC PRESENT, PREDOMINANTLY PMN     NO     SQUAMOUS EPITHELIAL CELLS PRESENT     MODERATE GRAM NEGATIVE RODS   Culture     Final   Value: ABUNDANT ESCHERICHIA COLI     Note: Confirmed  Extended Spectrum Beta-Lactamase Producer (ESBL) CRITICAL RESULT CALLED TO, READ BACK BY AND VERIFIED WITH: Crist Infante 07/14/14 1115 BY SMITHERSJ     Performed at [facility]   Report Status 07/14/2014 FINAL  Final   Organism ID, Bacteria ESCHERICHIA COLI   Final  CLOSTRIDIUM DIFFICILE BY PCR     Status: None   Collection Time    07/17/14  3:32 PM      Result Value Ref Range Status   C difficile by pcr NEGATIVE  NEGATIVE Final   Comment: Performed at [facility]  CULTURE, BLOOD (ROUTINE X 2)     Status: None   Collection Time    07/18/14  8:59 AM      Result Value Ref Range Status   Specimen Description BLOOD LEFT ARM   Final   Special Requests BOTTLES DRAWN AEROBIC AND ANAEROBIC 10CC EA   Final   Culture  Setup Time     Final   Value: 07/18/2014 12:19     Performed at [facility]   Culture     Final   Value:        BLOOD CULTURE RECEIVED NO GROWTH TO DATE CULTURE WILL BE HELD FOR 5 DAYS BEFORE ISSUING A FINAL NEGATIVE REPORT     Performed at [facility]   Report Status PENDING   Incomplete  CULTURE, BLOOD (ROUTINE X 2)     Status: None   Collection Time    07/18/14  9:03 AM      Result Value Ref Range Status   Specimen Description BLOOD LEFT ARM   Final   Special Requests BOTTLES DRAWN AEROBIC AND ANAEROBIC 10CC EA   Final   Culture  Setup Time     Final   Value: 07/18/2014 12:19     Performed at [facility]   Culture     Final   Value:        BLOOD CULTURE RECEIVED NO GROWTH TO DATE CULTURE WILL BE HELD FOR 5 DAYS BEFORE ISSUING A FINAL NEGATIVE REPORT     Performed at [facility]   Report Status PENDING   Incomplete  CULTURE, ROUTINE-ABSCESS     Status: None   Collection Time    07/18/14 11:03 AM      Result Value Ref Range Status   Specimen Description ABDOMEN JPD   Final   Special Requests NONE   Final   Gram Stain     Final   Value: RARE WBC PRESENT, PREDOMINANTLY PMN     NO SQUAMOUS EPITHELIAL CELLS SEEN      MODERATE GRAM NEGATIVE RODS     Performed at [facility]   Culture     Final   Value: ABUNDANT ESCHERICHIA COLI     Note: Confirmed Extended Spectrum Beta-Lactamase Producer (ESBL) CRITICAL RESULT CALLED TO, READ BACK BY AND VERIFIED WITH: PAULA ARMSTRONG @ 0900 ON [id number] BY [facility]     Performed at [facility]   Report Status 07/20/2014 FINAL   Final   Organism ID, Bacteria ESCHERICHIA COLI   Final  URINE CULTURE     Status: None   Collection Time    07/18/14  9:04 PM      Result Value Ref Range Status   Specimen Description URINE, CLEAN CATCH   Final   Special Requests Normal   Final   Culture  Setup Time     Final   Value: 07/19/2014 00:25     Performed at [facility] Count     Final   Value: 60,000 COLONIES/ML     Performed at [facility]   Culture     Final  Value: YEAST     [facility]   Report Status 07/20/2014 FINAL   Final    Studies/Results: No results found.    Assessment/Plan:  Principal Problem:   Diverticulitis of left colon s/p lap assisted subtotal colectomy 07/01/2014 Active Problems:   Sarcoidosis   Hypertension   Postoperative intra-abdominal abscess - ESBL resistent Ecoli   Superficial venous thrombosis of arm   Pancreatitis, acute   Protein-calorie malnutrition, moderate    [name] [name] is a 48 y.o. male with Complicated diverticulitis sp LAP total colectomy, then found to have intraabdominal abscess with ESBL. Dr Abbey Chattersosenbower was worried this am about patient perhaps having other organisms involved in the abscess besides the ESBL. He had brought the patient out to imipenem to cover sit for Pseudomonas. After thorough discussion I was agreeable to adding in MRSA coverage with Zyvox and antifungal coverage with micafungin. He does have fresh blood cultures that are cooking now. In the interim he was also found to have a superficial thrombosis of the basilic vein  #1 Intraabdominal  abscess with ESBL: His WBC back up yet again despite VERY broad spectrum abx  --I am repeating dopplers today --if clot has extended would give systemic anticoagulation and pull PICC --consider pull PICC regardless --if still with high WBC despite other maneuvers then would get repeat CT scan abd and pelvis      LOS: 20 days   [name] [name] 07/21/2014, 5:20 PM

## 2014-07-21 NOTE — Progress Notes (Signed)
Adv to bland diet  Add supp shakes  Cal counts  Continue IV TNA nutrition.  See if he can be weaned off if tol PO better  Continue aggressive IV antibiotics for now x 5 days minimum. Improving clinically & lower white count hopeful sign. F/u cultures   Heat/elevation for RUE superficial DVT near PICC - consider removal & new PICC in LUE if WBC not better or pt develops pain/swelling in RUE   Mild ARF resolved - follow   HTN control   VTE prophylaxis- SCDs, etc   Mobilize as tolerated to help recovery - Get him up   Ardeth SportsmanSteven C. Sahra Converse, M.D., F.A.C.S.  Gastrointestinal and Minimally Invasive Surgery  [facility].  [address]  [place], [place] [address]  [phone] Main / Paging

## 2014-07-21 NOTE — Progress Notes (Signed)
20 Days Post-Op  Subjective: He feels OK, on a dysphagia diet and TNA.  Wound is healing nicely  Objective: Vital signs in last 24 hours: Temp:  [98.5 F (36.9 C)-98.6 F (37 C)] 98.5 F (36.9 C) (10/12 0529) Pulse Rate:  [79-85] 79 (10/12 0529) Resp:  [17-18] 18 (10/12 0529) BP: (120-133)/(64-78) 120/64 mmHg (10/12 0529) SpO2:  [97 %-100 %] 98 % (10/12 0529) Last BM Date: 07/21/14 (07/21/14 @ 0600) 480 Po recorded 6b stools recorded Afebrile, VSS WBC trending up Last film 07/18/14 Intake/Output from previous day: 10/11 0701 - 10/12 0700 In: 2805 [P.O.:480; I.V.:240; IV Piggyback:1000; TPN:1080] Out: 1 [Stool:1] Intake/Output this shift:    General appearance: alert, cooperative and no distress Resp: clear to auscultation bilaterally GI: soft, non-tender; bowel sounds normal; no masses,  no organomegaly  Lab Results:   Recent Labs  07/19/14 0420 07/21/14 0334  WBC 19.4* 22.0*  HGB 9.3* 9.0*  HCT 28.3* 27.5*  PLT 776* 673*    BMET  Recent Labs  07/20/14 0421 07/21/14 0334  NA 138 132*  K 4.0 4.0  CL 102 96  CO2 24 25  GLUCOSE 124* 132*  BUN 14 15  CREATININE 1.06 1.02  CALCIUM 8.8 9.1   PT/INR No results found for this basename: LABPROT, INR,  in the last 72 hours   Recent Labs Lab 07/18/14 0430 07/19/14 0420 07/21/14 0334  AST 17 12 16   ALT 24 16 13   ALKPHOS 101 89 80  BILITOT 0.5 0.3 0.3  PROT 7.8 6.9 7.1  ALBUMIN 2.7* 2.5* 2.5*     Lipase     Component Value Date/Time   LIPASE 38 07/17/2014 0915     Studies/Results: No results found.  Medications: . acetaminophen  1,000 mg Oral TID  . enoxaparin (LOVENOX) injection  50 mg Subcutaneous Q24H  . losartan  25 mg Oral QHS   And  . hydrochlorothiazide  6.25 mg Oral QHS  . imipenem-cilastatin  500 mg Intravenous Q6H  . insulin aspart  0-9 Units Subcutaneous 4 times per day  . linezolid  600 mg Intravenous Q12H  . lip balm  1 application Topical BID  . micafungin (MYCAMINE) IV  150  mg Intravenous Q24H  . pantoprazole (PROTONIX) IV  40 mg Intravenous QHS  . saccharomyces boulardii  250 mg Oral BID    Assessment/Plan Recurrent sigmoid diverticulitis  laparoscopic-assisted subtotal colectomy with partial omentectomy, incidental appendectomy, rigid proctosigmoidoscopy.  Surgeon: Adolph PollackOSENBOWER,TODD J  07/01/14 (private patient) Post op intraabdominal abscess  Drain placement 07/12/14) ABUNDANT ESCHERICHIA COLI Note: Confirmed Extended Spectrum Beta-Lactamase Producer (ESBL)  Sensitive to Imipenen Post op ileus Malnutrition on TNA Superficial DVT    Plan:  Will discuss advancing diet and loose stools.  LOS: 20 days    [name] [name] 07/21/2014

## 2014-07-22 LAB — GLUCOSE, CAPILLARY
GLUCOSE-CAPILLARY: 147 mg/dL — AB (ref 70–99)
GLUCOSE-CAPILLARY: 143 mg/dL — AB (ref 70–99)
Glucose-Capillary: 146 mg/dL — ABNORMAL HIGH (ref 70–99)
Glucose-Capillary: 124 mg/dL — ABNORMAL HIGH (ref 70–99)

## 2014-07-22 LAB — CBC
HCT: 27.9 % — ABNORMAL LOW (ref 39.0–52.0)
HEMOGLOBIN: 9.1 g/dL — AB (ref 13.0–17.0)
MCH: 28.3 pg (ref 26.0–34.0)
MCHC: 32.6 g/dL (ref 30.0–36.0)
MCV: 86.9 fL (ref 78.0–100.0)
Platelets: 609 10*3/uL — ABNORMAL HIGH (ref 150–400)
RBC: 3.21 MIL/uL — ABNORMAL LOW (ref 4.22–5.81)
RDW: 13.9 % (ref 11.5–15.5)
WBC: 23.9 10*3/uL — ABNORMAL HIGH (ref 4.0–10.5)

## 2014-07-22 LAB — BASIC METABOLIC PANEL
Anion gap: 14 (ref 5–15)
BUN: 17 mg/dL (ref 6–23)
CALCIUM: 9.1 mg/dL (ref 8.4–10.5)
CO2: 24 mEq/L (ref 19–32)
Chloride: 96 mEq/L (ref 96–112)
Creatinine, Ser: 1.01 mg/dL (ref 0.50–1.35)
GFR, EST NON AFRICAN AMERICAN: 87 mL/min — AB (ref >90)
GLUCOSE: 135 mg/dL — AB (ref 70–99)
Potassium: 4.1 mEq/L (ref 3.7–5.3)
Sodium: 134 mEq/L — ABNORMAL LOW (ref 137–147)

## 2014-07-22 MED ORDER — TRACE MINERALS CR-CU-F-FE-I-MN-MO-SE-ZN IV SOLN
INTRAVENOUS | Status: AC
  Administered 2014-07-22: 17:00:00 via INTRAVENOUS
  Filled 2014-07-22: qty 2000

## 2014-07-22 MED ORDER — BISMUTH SUBSALICYLATE 262 MG/15ML PO SUSP
30.0000 mL | Freq: Two times a day (BID) | ORAL | Status: DC
  Filled 2014-07-22: qty 236

## 2014-07-22 MED ORDER — TRACE MINERALS CR-CU-F-FE-I-MN-MO-SE-ZN IV SOLN
INTRAVENOUS | Status: DC
  Filled 2014-07-22: qty 2400

## 2014-07-22 MED ORDER — FAT EMULSION 20 % IV EMUL
250.0000 mL | INTRAVENOUS | Status: AC
Start: 2014-07-22 — End: 2014-07-23
  Administered 2014-07-22: 250 mL via INTRAVENOUS
  Filled 2014-07-22: qty 250

## 2014-07-22 MED ORDER — PSYLLIUM 95 % PO PACK
1.0000 | PACK | Freq: Every day | ORAL | Status: DC
Start: 2014-07-22 — End: 2014-07-26
  Administered 2014-07-23: 1 via ORAL
  Filled 2014-07-22 (×5): qty 1

## 2014-07-22 MED ORDER — MORPHINE SULFATE 2 MG/ML IJ SOLN
2.0000 mg | INTRAMUSCULAR | Status: DC | PRN
  Administered 2014-07-22 – 2014-07-27 (×6): 2 mg via INTRAVENOUS
  Filled 2014-07-22 (×6): qty 1

## 2014-07-22 NOTE — Progress Notes (Signed)
PARENTERAL NUTRITION CONSULT NOTE  Pharmacy Consult for TPN Indication: Prolonged postoperative ileus  Allergies  Allergen Reactions  . Celebrex [Celecoxib] Other (See Comments)    "Mom is allergic, so I do not take it"  . Sulfa Antibiotics Nausea And Vomiting  . Codeine Rash  . Dilaudid [Hydromorphone Hcl] Nausea And Vomiting and Anxiety   Patient Measurements: Height: 6' (182.9 cm) Weight: 227 lb 15.3 oz (103.4 kg) IBW/kg (Calculated) : 77.6  Vital Signs: Temp: 98.7 F (37.1 C) (10/13 0534) Temp Source: Oral (10/13 0534) BP: 118/67 mmHg (10/13 0534) Pulse Rate: 76 (10/13 0534) Intake/Output from previous day: 10/12 0701 - 10/13 0700 In: 3730 [P.O.:960; I.V.:240; IV Piggyback:1200; TPN:1320] Out: 6 [Drains:6] Intake/Output from this shift:   Labs:  Recent Labs  07/21/14 0334 07/22/14 0445  WBC 22.0* 23.9*  HGB 9.0* 9.1*  HCT 27.5* 27.9*  PLT 673* 609*    Recent Labs  07/20/14 0421 07/21/14 0334 07/21/14 0335 07/22/14 0445  NA 138 132*  --  134*  K 4.0 4.0  --  4.1  CL 102 96  --  96  CO2 24 25  --  24  GLUCOSE 124* 132*  --  135*  BUN 14 15  --  17  CREATININE 1.06 1.02  --  1.01  CALCIUM 8.8 9.1  --  9.1  MG  --  2.1  --   --   PHOS  --  3.9  --   --   PROT  --  7.1  --   --   ALBUMIN  --  2.5*  --   --   AST  --  16  --   --   ALT  --  13  --   --   ALKPHOS  --  80  --   --   BILITOT  --  0.3  --   --   PREALBUMIN  --  13.5*  --   --   TRIG  --   --  58  --    Estimated Creatinine Clearance: 113.6 ml/min (by C-G formula based on Cr of 1.01).    Recent Labs  07/21/14 1753 07/21/14 2103 07/22/14 0741  GLUCAP 131* 119* 146*   Insulin Requirements in past 24 hrs: sensitive SSI achs: 3 units   Current Nutrition: Thin liquids (10/11), advance to pureed (10/12) + Ensure Complete TID  IVF: NS at 10 ml/hr  Central access: PICC placed 9/29 TPN start date: 9/29  ASSESSMENT                                                                                                        HPI:  [id number] yoM with recurrent sigmoid diverticulitis. S/p lap assisted subtotal colectomy on 9/22 with subsequent postop ileus. Initially, TPN was started 9/27 and stopped 10/7 when he was tolerating an enteral diet.  Pharmacy was consulted to resume TPN on 10/9 following recurrent ileus on 10/8.    Significant events:  9/27: Start TPN 10/2: Passing flatus, multiple loose BMs.  CT w/ fluid collection. 10/6:  tolerating FLD, advancing to soft diet. TPN wean 10/7: TPN stopped 10/9: resume TPN for recurrent ileus, NGT to suction 10/11: NG clamping trial, starting thin liquids - 480cc intake charted 10/12: Diet advanced to pureed - 25% dinner charted. Repeat UE doppler to evaluate for clot extension; may need to remove PICC. 10/13: UE doppler completed - no propagation noted. Calorie count in progress. Tolerating some soft foods and one Ensure. Per CCS, begin weaning TNA.  Today 10/13:  Glucose - most at goal of < 150 except mid-evening values elevated  Electrolytes - Na slightly low, all others WNL  Renal - SCr improving daily  LFTs - WNL   TGs - WNL (9/30, 10/5, 10/8, 10/9, 10/12)  Prealbumin - 13.9 (9/30),  14.4 (10/5), 14.7 (10/9), 13.5 (10/12)  NUTRITIONAL GOALS                                                                                    RD recs: 1950-2250 Kcal, 120-140g Protein, 1.9-2.2 L/day fluid Clinimix 5/15 at a goal rate of 15900ml/hr + 20% fat emulsion at ml/hr to provide: 120 g/day protein, 2184 Kcal/day.  PLAN                                                                                                                  Decrease Clinimix E 5/15 to 83 ml/hr   Continue 20% fat emulsion at 1610ml/hr.  Follow up advancement of diet for ability to further wean and dc   Standard multivitamins and trace elements added to TPN  Continue CBGs and sensitive scale ACHS  IVF per MD orders  TNA lab panels on Mondays & Thursdays.  Loralee PacasErin  Gwendloyn Forsee, PharmD, BCPS Pager: [phone]  07/22/2014 7:55 AM

## 2014-07-22 NOTE — Progress Notes (Signed)
Cont bland diet - adv to heart healthy as tolerated Cont supp shakes  Cal counts  Continue IV TNA nutrition. Start to wean off since tol PO better.  Then can d/c PICC!   Continue aggressive IV antibiotics for now until at least tomorrow (x 5 days minimum). Improving clinically & not massively worsening WBC hopeful sign.   CT scan if WBC worse tomorrow.    I am going to trust his clinical improvement over his persistently high/fluctuating WBC  Heat/elevation for RUE superficial DVT near PICC - consider removal & new PICC in LUE if WBC not better or pt develops pain/swelling in RUE   Mild ARF resolved - follow  HTN control  VTE prophylaxis- SCDs, etc  Mobilize as tolerated to help recovery  Ardeth SportsmanSteven C. Dax Murguia, M.D., F.A.C.S. Gastrointestinal and Minimally Invasive Surgery [facility] [address] [place], [place] [address] [phone] Main / Paging

## 2014-07-22 NOTE — Progress Notes (Signed)
[facility]  Day # 5  imipenem Day # 5 zyvox Day # 5  micafungin  4 day of Invanz  Subjective: No new complaints   Antibiotics:  Anti-infectives   Start     Dose/Rate Route Frequency Ordered Stop   07/20/14 2200  imipenem-cilastatin (PRIMAXIN) 500 mg in sodium chloride 0.9 % 100 mL IVPB     500 mg 200 mL/hr over 30 Minutes Intravenous Every 6 hours 07/20/14 1652     07/18/14 1100  micafungin (MYCAMINE) 150 mg in sodium chloride 0.9 % 100 mL IVPB     150 mg 100 mL/hr over 1 Hours Intravenous Every 24 hours 07/18/14 0907     07/18/14 1000  linezolid (ZYVOX) IVPB 600 mg     600 mg 300 mL/hr over 60 Minutes Intravenous Every 12 hours 07/18/14 0907     07/17/14 2000  imipenem-cilastatin (PRIMAXIN) 500 mg in sodium chloride 0.9 % 100 mL IVPB  Status:  Discontinued     500 mg 200 mL/hr over 30 Minutes Intravenous Every 6 hours 07/17/14 1743 07/20/14 1652   07/14/14 1600  ertapenem (INVANZ) 1 g in sodium chloride 0.9 % 50 mL IVPB  Status:  Discontinued     1 g 100 mL/hr over 30 Minutes Intravenous Every 24 hours 07/14/14 1457 07/17/14 1743   07/14/14 0800  piperacillin-tazobactam (ZOSYN) IVPB 3.375 g  Status:  Discontinued     3.375 g 12.5 mL/hr over 240 Minutes Intravenous Every 8 hours 07/14/14 0723 07/14/14 1456   07/08/14 2100  vancomycin (VANCOCIN) IVPB 1000 mg/200 mL premix  Status:  Discontinued     1,000 mg 200 mL/hr over 60 Minutes Intravenous Every 12 hours 07/08/14 1104 07/10/14 0832   07/08/14 0800  vancomycin (VANCOCIN) 2,000 mg in sodium chloride 0.9 % 500 mL IVPB     2,000 mg 250 mL/hr over 120 Minutes Intravenous  Once 07/08/14 0757 07/08/14 1107   07/07/14 1000  piperacillin-tazobactam (ZOSYN) IVPB 3.375 g  Status:  Discontinued     3.375 g 12.5 mL/hr over 240 Minutes Intravenous Every 8 hours 07/07/14 0821 07/10/14 0832   07/01/14 2000  cefoTEtan (CEFOTAN) 2 g in dextrose 5 % 50 mL IVPB     2 g 100 mL/hr over 30 Minutes Intravenous  Every 12 hours 07/01/14 1339 07/01/14 2015   07/01/14 0625  cefoTEtan in Dextrose 5% (CEFOTAN) IVPB 2 g  Status:  Discontinued     2 g Intravenous Every 12 hours 07/01/14 0625 07/01/14 1413      Medications: Scheduled Meds: . acetaminophen  1,000 mg Oral TID  . bismuth subsalicylate  30 mL Oral BID AC  . enoxaparin (LOVENOX) injection  50 mg Subcutaneous Q24H  . feeding supplement (ENSURE COMPLETE)  237 mL Oral TID WC  . losartan  25 mg Oral QHS   And  . hydrochlorothiazide  6.25 mg Oral QHS  . imipenem-cilastatin  500 mg Intravenous Q6H  . insulin aspart  0-5 Units Subcutaneous QHS  . insulin aspart  0-9 Units Subcutaneous TID WC  . linezolid  600 mg Intravenous Q12H  . lip balm  1 application Topical BID  . micafungin [facility]) IV  150 mg Intravenous Q24H  . psyllium  1 packet Oral Daily  . saccharomyces boulardii  250 mg Oral BID  . simethicone  80 mg Oral BID   Continuous Infusions: . sodium chloride 10 mL/hr at 07/20/14 0053  . fat emulsion 250 mL (07/22/14 1706)  . Marland Kitchen  TPN (CLINIMIX-E) Adult 83 mL/hr at 07/22/14 1707   PRN Meds:.alum & mag hydroxide-simeth, colchicine, diphenhydrAMINE, diphenhydrAMINE, LORazepam, magic mouthwash, menthol-cetylpyridinium, metoprolol, morphine injection, ondansetron (ZOFRAN) IV, ondansetron, ondansetron, phenol, promethazine, sodium chloride, zolpidem    Objective: Weight change:   Intake/Output Summary (Last 24 hours) at 07/22/14 1847 Last data filed at 07/22/14 1800  Gross per 24 hour  Intake   2840 ml  Output    177 ml  Net   2663 ml   Blood pressure 121/66, pulse 89, temperature 98.4 F (36.9 C), temperature source Oral, resp. rate 18, height 6' (1.829 m), weight 227 lb 15.3 oz (103.4 kg), SpO2 99.00%. Temp:  [98.3 F (36.8 C)-98.7 F (37.1 C)] 98.4 F (36.9 C) (10/13 1400) Pulse Rate:  [76-89] 89 (10/13 1400) Resp:  [16-18] 18 (10/13 1400) BP: (102-121)/(63-67) 121/66 mmHg (10/13 1400) SpO2:  [98 %-99 %] 99 % (10/13  1400)  Physical Exam: General: Alert and awake, oriented x3 HEENT: anicteric sclera, EOMI,  CVS regular rate, normal r, no murmur rubs or gallops  Chest: clear to auscultation bilaterally, no wheezing, rales or rhonchi  Abdomen: surgical sites are clean, JP drain is in place with less material in drain today, diffusely tender ,  Skin: no rashes , swelling in forearm on left Neuro: nonfocal, strength and sensation intact   CBC:  CBC Latest Ref Rng 07/22/2014 07/21/2014 07/19/2014  WBC 4.0 - 10.5 K/uL 23.9(H) 22.0(H) 19.4(H)  Hemoglobin 13.0 - 17.0 g/dL 4.0(J9.1(L) 8.1(X9.0(L) 9.1(Y9.3(L)  Hematocrit 39.0 - 52.0 % 27.9(L) 27.5(L) 28.3(L)  Platelets 150 - 400 K/uL 609(H) 673(H) 776(H)      BMET  Recent Labs  07/21/14 0334 07/22/14 0445  NA 132* 134*  K 4.0 4.1  CL 96 96  CO2 25 24  GLUCOSE 132* 135*  BUN 15 17  CREATININE 1.02 1.01  CALCIUM 9.1 9.1     Liver Panel   Recent Labs  07/21/14 0334  PROT 7.1  ALBUMIN 2.5*  AST 16  ALT 13  ALKPHOS 80  BILITOT 0.3       Sedimentation Rate No results found for this basename: ESRSEDRATE,  in the last 72 hours C-Reactive Protein No results found for this basename: CRP,  in the last 72 hours  Micro Results: Recent Results (from the past 720 hour(s))  CLOSTRIDIUM DIFFICILE BY PCR     Status: None   Collection Time    07/11/14  9:45 AM      Result Value Ref Range Status   C difficile by pcr NEGATIVE  NEGATIVE Final   Comment: Performed [facility]  CULTURE, ROUTINE-ABSCESS     Status: None   Collection Time    07/12/14  8:53 AM      Result Value Ref Range Status   Specimen Description ABSCESS PERITONEAL   Final   Special Requests Normal   Final   Gram Stain     Final   Value: MODERATE WBC PRESENT, PREDOMINANTLY MONONUCLEAR     FEW WBC PRESENT, PREDOMINANTLY PMN     NO     SQUAMOUS EPITHELIAL CELLS PRESENT     MODERATE GRAM NEGATIVE RODS   Culture     Final   Value: ABUNDANT ESCHERICHIA COLI     Note:  Confirmed Extended Spectrum Beta-Lactamase Producer (ESBL) CRITICAL RESULT CALLED TO, READ BACK BY AND VERIFIED WITH: Crist InfanteKATHERINE STRUPE 07/14/14 1115 BY SMITHERSJ     Performed at [facility]   Report Status 07/14/2014 FINAL  Final   Organism ID, Bacteria ESCHERICHIA COLI   Final  CLOSTRIDIUM DIFFICILE BY PCR     Status: None   Collection Time    07/17/14  3:32 PM      Result Value Ref Range Status   C difficile by pcr NEGATIVE  NEGATIVE Final   Comment: Performed at [facility]  CULTURE, BLOOD (ROUTINE X 2)     Status: None   Collection Time    07/18/14  8:59 AM      Result Value Ref Range Status   Specimen Description BLOOD LEFT ARM   Final   Special Requests BOTTLES DRAWN AEROBIC AND ANAEROBIC 10CC EA   Final   Culture  Setup Time     Final   Value: 07/18/2014 12:19     Performed at [facility]   Culture     Final   Value:        BLOOD CULTURE RECEIVED NO GROWTH TO DATE CULTURE WILL BE HELD FOR 5 DAYS BEFORE ISSUING A FINAL NEGATIVE REPORT     Performed at [facility]   Report Status PENDING   Incomplete  CULTURE, BLOOD (ROUTINE X 2)     Status: None   Collection Time    07/18/14  9:03 AM      Result Value Ref Range Status   Specimen Description BLOOD LEFT ARM   Final   Special Requests BOTTLES DRAWN AEROBIC AND ANAEROBIC 10CC EA   Final   Culture  Setup Time     Final   Value: 07/18/2014 12:19     Performed at [facility]   Culture     Final   Value:        BLOOD CULTURE RECEIVED NO GROWTH TO DATE CULTURE WILL BE HELD FOR 5 DAYS BEFORE ISSUING A FINAL NEGATIVE REPORT     Performed at [facility]   Report Status PENDING   Incomplete  CULTURE, ROUTINE-ABSCESS     Status: None   Collection Time    07/18/14 11:03 AM      Result Value Ref Range Status   Specimen Description ABDOMEN JPD   Final   Special Requests NONE   Final   Gram Stain     Final   Value: RARE WBC PRESENT, PREDOMINANTLY PMN     NO SQUAMOUS EPITHELIAL  CELLS SEEN     MODERATE GRAM NEGATIVE RODS     Performed at [facility]   Culture     Final   Value: ABUNDANT ESCHERICHIA COLI     Note: Confirmed Extended Spectrum Beta-Lactamase Producer (ESBL) CRITICAL RESULT CALLED TO, READ BACK BY AND VERIFIED WITH: PAULA ARMSTRONG @ 0900 ON [id number] BY [facility]     Performed at [facility]   Report Status 07/20/2014 FINAL   Final   Organism ID, Bacteria ESCHERICHIA COLI   Final  URINE CULTURE     Status: None   Collection Time    07/18/14  9:04 PM      Result Value Ref Range Status   Specimen Description URINE, CLEAN CATCH   Final   Special Requests Normal   Final   Culture  Setup Time     Final   Value: 07/19/2014 00:25     Performed at [facility]   Colony Count     Final   Value: 60,000 COLONIES/ML     Performed at [facility]   Culture     Final  Value: YEAST     Performed at [facility]   Report Status 07/20/2014 FINAL   Final    Studies/Results: No results found.    Assessment/Plan:  Principal Problem:   Diverticulitis of left colon s/p lap assisted subtotal colectomy 07/01/2014 Active Problems:   Sarcoidosis   Hypertension   Postoperative intra-abdominal abscess - ESBL resistent Ecoli   Superficial venous thrombosis of arm   Pancreatitis, acute   Protein-calorie malnutrition, moderate    [name] [name] is a 48 y.o. male with Complicated diverticulitis sp LAP total colectomy, then found to have intraabdominal abscess with ESBL. Dr Abbey Chatters was worried this am about patient perhaps having other organisms involved in the abscess besides the ESBL. He had brought the patient out to imipenem to cover sit for Pseudomonas. After thorough discussion I was agreeable to adding in MRSA coverage with Zyvox and antifungal coverage with micafungin. He does have fresh blood cultures that are cooking now. In the interim he was also found to have a superficial thrombosis of the basilic vein  #1  Intraabdominal abscess with ESBL: His WBC remains up despite drain in place and  VERY broad spectrum abx  --if still with high WBC despite other maneuvers then would get repeat CT scan abd and pelvis  (my plan was to get CT regardless before stopping the originally planned 2 week minimum of CARBAPENEM therapy)  He is now NINE days into that course currently being covered not just for the ESBL that we have from culture but also for organisms that we have NOT isolated such as pseudomonas, MRSA, VRE, candida  I DO WISH TO BE JUDICIOUS WITH CT SCANS THOUGH (he has had SIX CT scans in 2015 so far) I do think we are going to need another one before stopping his abx and my main concern is that he still has an abscess that requires another IR guided drain or worst case scenario surgical intervention    LOS: 21 days   Acey Lav 07/22/2014, 6:47 PM

## 2014-07-22 NOTE — Progress Notes (Signed)
21 Days Post-Op  Subjective: Happy to be eating still having allot of diarrhea.  Notes he feels bloated and not very hungry.  Up some and walking some.  Objective: Vital signs in last 24 hours: Temp:  [98.3 F (36.8 C)-98.7 F (37.1 C)] 98.7 F (37.1 C) (10/13 0534) Pulse Rate:  [76-81] 76 (10/13 0534) Resp:  [16-18] 18 (10/13 0534) BP: (102-118)/(63-67) 118/67 mmHg (10/13 0534) SpO2:  [98 %-99 %] 99 % (10/13 0534) Last BM Date: 07/22/14 960 PO,  Soft diet WBC is still 23.9 Afebrile, VSS Dopplers:  Right upper extremity venous duplex completed.  Preliminary report: Right : No evidence of deep vein thrombosis. The superficial thrombus of the short segment of the proximal basilic vein is still present with no propagation noted.   Last CT 07/16/14 Intake/Output from previous day: 10/12 0701 - 10/13 0700 In: 3730 [P.O.:960; I.V.:240; IV Piggyback:1200; TPN:1320] Out: 6 [Drains:6] Intake/Output this shift: Total I/O In: 570 [I.V.:30; IV Piggyback:100; TPN:440] Out: 0   General appearance: alert, cooperative and no distress Resp: clear to auscultation bilaterally GI: soft, non-tender; bowel sounds normal; no masses,  no organomegaly and incision healing well  Lab Results:   Recent Labs  07/21/14 0334 07/22/14 0445  WBC 22.0* 23.9*  HGB 9.0* 9.1*  HCT 27.5* 27.9*  PLT 673* 609*    BMET  Recent Labs  07/21/14 0334 07/22/14 0445  NA 132* 134*  K 4.0 4.1  CL 96 96  CO2 25 24  GLUCOSE 132* 135*  BUN 15 17  CREATININE 1.02 1.01  CALCIUM 9.1 9.1   PT/INR No results found for this basename: LABPROT, INR,  in the last 72 hours   Recent Labs Lab 07/18/14 0430 07/19/14 0420 07/21/14 0334  AST 17 12 16   ALT 24 16 13   ALKPHOS 101 89 80  BILITOT 0.5 0.3 0.3  PROT 7.8 6.9 7.1  ALBUMIN 2.7* 2.5* 2.5*     Lipase     Component Value Date/Time   LIPASE 38 07/17/2014 0915     Studies/Results: No results found.  Medications: . acetaminophen  1,000 mg Oral  TID  . enoxaparin (LOVENOX) injection  50 mg Subcutaneous Q24H  . feeding supplement (ENSURE COMPLETE)  237 mL Oral TID WC  . losartan  25 mg Oral QHS   And  . hydrochlorothiazide  6.25 mg Oral QHS  . imipenem-cilastatin  500 mg Intravenous Q6H  . insulin aspart  0-5 Units Subcutaneous QHS  . insulin aspart  0-9 Units Subcutaneous TID WC  . linezolid  600 mg Intravenous Q12H  . lip balm  1 application Topical BID  . micafungin (MYCAMINE) IV  150 mg Intravenous Q24H  . saccharomyces boulardii  250 mg Oral BID  . simethicone  80 mg Oral BID    Assessment/Plan Recurrent sigmoid diverticulitis  laparoscopic-assisted subtotal colectomy with partial omentectomy, incidental appendectomy, rigid proctosigmoidoscopy.  Surgeon: Adolph PollackOSENBOWER,TODD J 07/01/14 (private patient)  Post op intraabdominal abscess Drain placement 07/12/14)  ABUNDANT ESCHERICHIA COLI Note: Confirmed Extended Spectrum Beta-Lactamase Producer (ESBL)  Sensitive to Imipenen  Post op ileus  Malnutrition on TNA  Superficial DVT   Plan:  Start weaning TNA, I will check on repeat CT scan.  Dr. Daiva EvesVan Dam would like to get the PICC line out and I will discuss with Dr. Michaell CowingGross. Continuing his antibiotics.  LOS: 21 days    [name] [name] 07/22/2014

## 2014-07-22 NOTE — Progress Notes (Signed)
VASCULAR LAB PRELIMINARY  PRELIMINARY  PRELIMINARY  PRELIMINARY  Right upper extremity venous duplex completed.    Preliminary report:  Right  : No evidence of deep vein thrombosis. The superficial thrombus of the short segment of the proximal basilic vein is still present with no propagation noted.  Carly Sabo, RVS 07/22/2014, 9:25 AM

## 2014-07-22 NOTE — Progress Notes (Signed)
Pt states he has only had an Ensure so far today.  States he is feeling bloated and a little nauseated.  Metamucil held for now but will offer at a later time.  Pt up walking in hallway frequently.

## 2014-07-22 NOTE — Progress Notes (Signed)
Calorie Count Note  48 hour calorie count ordered.  Pt is also receiving TPN. Per pharmacy, Pt is receiving Clinimix 5/15 at a goal rate of 13400ml/hr + 20% fat emulsion at ml/hr to provide: 120 g/day protein, 2184 Kcal/day.  Diet: Soft Supplements: Ensure Complete supplement po TID each provides 350 kcal and 13 grams of protein   10/12 Lunch: 0% Dinner: 120 kcal, 11 g protein Supplements: 0%  Total intake: 120 kcal (6% of minimum estimated needs)  11 g protein (9% of minimum estimated needs)   10/13 Breakfast: 0% Lunch: 0% Dinner: 0% Supplements: 1 Ensure Complete (350 kcal, 13 g Protein)  Total Intake:  350 kcal (18% of minimum estimated needs) 13 g protein (11% of minimum estimated needs)  10/14 Breakfast: 0% Lunch: 118 kcal, 0 g protein Supplements: 0%  Total intake: 118 kcal (6% of minimum estimated needs)  0 g protein (0% of minimum estimated needs)   Nutrition Dx: Inadequate oral intake related to recent surgery as evidenced by poor PO intake <25%  Goal: Pt to meet >/= 90% of their estimated nutrition needs, meeting   Intervention: Recommend continuing TPN if pt continues to have poor PO intake <25%  [name] FrancoLindsey Khizar Fiorella, MS, RD, [facility] Provisionally Licensed Dietitian Nutritionist Pager: [phone]

## 2014-07-23 ENCOUNTER — Inpatient Hospital Stay (HOSPITAL_COMMUNITY): Payer: BC Managed Care – PPO

## 2014-07-23 ENCOUNTER — Encounter (HOSPITAL_COMMUNITY): Payer: Self-pay | Admitting: Radiology

## 2014-07-23 LAB — GLUCOSE, CAPILLARY
GLUCOSE-CAPILLARY: 116 mg/dL — AB (ref 70–99)
GLUCOSE-CAPILLARY: 106 mg/dL — AB (ref 70–99)
Glucose-Capillary: 127 mg/dL — ABNORMAL HIGH (ref 70–99)
Glucose-Capillary: 112 mg/dL — ABNORMAL HIGH (ref 70–99)
Glucose-Capillary: 108 mg/dL — ABNORMAL HIGH (ref 70–99)

## 2014-07-23 LAB — CBC
HEMATOCRIT: 26.7 % — AB (ref 39.0–52.0)
HEMOGLOBIN: 9 g/dL — AB (ref 13.0–17.0)
MCH: 29.5 pg (ref 26.0–34.0)
MCHC: 33.7 g/dL (ref 30.0–36.0)
MCV: 87.5 fL (ref 78.0–100.0)
Platelets: 531 10*3/uL — ABNORMAL HIGH (ref 150–400)
RBC: 3.05 MIL/uL — ABNORMAL LOW (ref 4.22–5.81)
RDW: 14 % (ref 11.5–15.5)
WBC: 19.1 10*3/uL — AB (ref 4.0–10.5)

## 2014-07-23 MED ORDER — IOHEXOL 300 MG/ML  SOLN
100.0000 mL | Freq: Once | INTRAMUSCULAR | Status: AC | PRN
  Administered 2014-07-23: 100 mL via INTRAVENOUS

## 2014-07-23 MED ORDER — TRACE MINERALS CR-CU-F-FE-I-MN-MO-SE-ZN IV SOLN
INTRAVENOUS | Status: AC
  Administered 2014-07-23: 18:00:00 via INTRAVENOUS
  Filled 2014-07-23: qty 1000

## 2014-07-23 MED ORDER — FAT EMULSION 20 % IV EMUL
250.0000 mL | INTRAVENOUS | Status: AC
  Administered 2014-07-23: 250 mL via INTRAVENOUS
  Filled 2014-07-23: qty 250

## 2014-07-23 NOTE — Progress Notes (Signed)
CT shows some decrease in abscess size but not complete resolution.  Discussed with him.

## 2014-07-23 NOTE — Progress Notes (Signed)
Referring Physician(s): CCS  Subjective: Patient states he had a better day today. He was able to eat a good portion without N/V, he ambulated the halls.   Allergies: Celebrex; Sulfa antibiotics; Codeine; and Dilaudid  Medications: Prior to Admission medications   Medication Sig Start Date End Date Taking? Authorizing Provider  diphenhydrAMINE (BENADRYL) 25 MG tablet Take 25 mg by mouth every 6 (six) hours as needed for allergies.    Yes Historical Provider, MD  ondansetron (ZOFRAN-ODT) 4 MG disintegrating tablet Take 4 mg by mouth every 4 (four) hours as needed for nausea or vomiting.   Yes Historical Provider, MD  zolpidem (AMBIEN) 10 MG tablet Take 10 mg by mouth at bedtime as needed for sleep.    Yes Historical Provider, MD  colchicine 0.6 MG tablet Take 0.6 mg by mouth 2 (two) times daily as needed (For gout.).  06/04/14   Historical Provider, MD  losartan-hydrochlorothiazide (HYZAAR) 50-12.5 MG per tablet Take 0.5 tablets by mouth at bedtime.  11/18/13   Historical Provider, MD  Probiotic Product (PROBIOTIC DAILY PO) Take 1 capsule by mouth at bedtime.    Historical Provider, MD    Review of Systems  Vital Signs: BP 99/63  Pulse 69  Temp(Src) 97.9 F (36.6 C) (Oral)  Resp 18  Ht 6' (1.829 m)  Wt 227 lb 15.3 oz (103.4 kg)  BMI 30.91 kg/m2  SpO2 98%  Physical Exam General: A&Ox3, lying bed  Abd: Soft, ND, left RP drain intact NT with minimal cloudy output in bulb, 2 cc last 24 hours recorded.   Imaging: Ct Abdomen Pelvis W Contrast  07/23/2014   CLINICAL DATA:  Intraabdominal abscess s/p drain placement; ileus subtotal colectomy 07/01/2014. Postoperative abscess adjacent to the pancreas.  EXAM: CT ABDOMEN AND PELVIS WITH CONTRAST  TECHNIQUE: Multidetector CT imaging of the abdomen and pelvis was performed using the standard protocol following bolus administration of intravenous contrast.  CONTRAST:  100mL OMNIPAQUE IOHEXOL 300 MG/ML  SOLN  COMPARISON:  Multiple priors.   Most recent CT 07/16/2014.  FINDINGS: Atelectasis the lung bases.  Pigtail drainage catheter anterior to the pancreas in the abscess. The drainage catheter is unchanged in position compared to prior.  The abscess is best visualized on coronal images and compared to the prior exam from 07/16/2014. When direct comparison is made, the abscess appears slightly smaller in the craniocaudal dimension, now measuring 33 mm, previously 42 mm. On axial images, the abscess has changed configuration slightly. Axial measurement today is 32 mm x 42 mm, previously 37 mm x 34 mm.  There is persistent distention of small bowel compatible with ileus.  IMPRESSION: 1. Unchanged pigtail drainage catheter in abscess anterior to the body of the pancreas. The abscess has slightly decreased in size compared 07/16/2014. 2. Persistent ileus, not changed compared to prior CT.   Electronically Signed   By: Andreas NewportGeoffrey  Lamke M.D.   On: 07/23/2014 12:26    Labs:  CBC:  Recent Labs  07/19/14 0420 07/21/14 0334 07/22/14 0445 07/23/14 0525  WBC 19.4* 22.0* 23.9* 19.1*  HGB 9.3* 9.0* 9.1* 9.0*  HCT 28.3* 27.5* 27.9* 26.7*  PLT 776* 673* 609* 531*    COAGS:  Recent Labs  06/25/14 1445 07/12/14 0508  INR 1.08 1.19  APTT  --  34    BMP:  Recent Labs  07/19/14 0420 07/20/14 0421 07/21/14 0334 07/22/14 0445  NA 137 138 132* 134*  K 4.2 4.0 4.0 4.1  CL 101 102 96 96  CO2  25 24 25 24   GLUCOSE 134* 124* 132* 135*  BUN 21 14 15 17   CALCIUM 8.8 8.8 9.1 9.1  CREATININE 1.19 1.06 1.02 1.01  GFRNONAA 72* 82* 86* 87*  GFRAA 83* >90 >90 >90    LIVER FUNCTION TESTS:  Recent Labs  07/14/14 0500 07/18/14 0430 07/19/14 0420 07/21/14 0334  BILITOT 0.4 0.5 0.3 0.3  AST 20 17 12 16   ALT 37 24 16 13   ALKPHOS 86 101 89 80  PROT 6.9 7.8 6.9 7.1  ALBUMIN 2.5* 2.7* 2.5* 2.5*    Assessment and Plan: Recurrent sigmoid diverticulitis s/p subtotal colectomy  Post operative left RP abscess s/p drainage 10/3, switched  to JP suction 10/5 Cx E. Coli ESBL, repeat Cx 10/9 E. Coli ESBL, ID on board  CT 10/7 shows decreased size of drained abscess, no additional abscesses CT 10/14 shows unchanged abscess superior to drain, minimal to no output from drain, D/w Dr. Archer AsaMcCullough today, IR will plan on drain injection 10/15 with possible upsize in drain or even removal, will discuss with CCS after injection. D/w patient today.  Persistent SB ileus, slowly improving NGT discontinued, IV TNA and tolerating diet. Leukocytosis, trending down today, afebrile.  Plans per ID/CCS     I spent a total of 15 minutes face to face in clinical consultation/evaluation, greater than 50% of which was counseling/coordinating care  Signed: Berneta LevinsMORGAN, Suvan Stcyr D 07/23/2014, 3:26 PM

## 2014-07-23 NOTE — Progress Notes (Signed)
[facility]  Day # 6  imipenem Day # 6 zyvox Day # 6  micafungin  4 day of Invanz  Subjective: Pt is tired was trying to nap   Antibiotics:  Anti-infectives   Start     Dose/Rate Route Frequency Ordered Stop   07/20/14 2200  imipenem-cilastatin (PRIMAXIN) 500 mg in sodium chloride 0.9 % 100 mL IVPB     500 mg 200 mL/hr over 30 Minutes Intravenous Every 6 hours 07/20/14 1652     07/18/14 1100  micafungin (MYCAMINE) 150 mg in sodium chloride 0.9 % 100 mL IVPB     150 mg 100 mL/hr over 1 Hours Intravenous Every 24 hours 07/18/14 0907     07/18/14 1000  linezolid (ZYVOX) IVPB 600 mg     600 mg 300 mL/hr over 60 Minutes Intravenous Every 12 hours 07/18/14 0907     07/17/14 2000  imipenem-cilastatin (PRIMAXIN) 500 mg in sodium chloride 0.9 % 100 mL IVPB  Status:  Discontinued     500 mg 200 mL/hr over 30 Minutes Intravenous Every 6 hours 07/17/14 1743 07/20/14 1652   07/14/14 1600  ertapenem (INVANZ) 1 g in sodium chloride 0.9 % 50 mL IVPB  Status:  Discontinued     1 g 100 mL/hr over 30 Minutes Intravenous Every 24 hours 07/14/14 1457 07/17/14 1743   07/14/14 0800  piperacillin-tazobactam (ZOSYN) IVPB 3.375 g  Status:  Discontinued     3.375 g 12.5 mL/hr over 240 Minutes Intravenous Every 8 hours 07/14/14 0723 07/14/14 1456   07/08/14 2100  vancomycin (VANCOCIN) IVPB 1000 mg/200 mL premix  Status:  Discontinued     1,000 mg 200 mL/hr over 60 Minutes Intravenous Every 12 hours 07/08/14 1104 07/10/14 0832   07/08/14 0800  vancomycin (VANCOCIN) 2,000 mg in sodium chloride 0.9 % 500 mL IVPB     2,000 mg 250 mL/hr over 120 Minutes Intravenous  Once 07/08/14 0757 07/08/14 1107   07/07/14 1000  piperacillin-tazobactam (ZOSYN) IVPB 3.375 g  Status:  Discontinued     3.375 g 12.5 mL/hr over 240 Minutes Intravenous Every 8 hours 07/07/14 0821 07/10/14 0832   07/01/14 2000  cefoTEtan (CEFOTAN) 2 g in dextrose 5 % 50 mL IVPB     2 g 100 mL/hr over 30 Minutes  Intravenous Every 12 hours 07/01/14 1339 07/01/14 2015   07/01/14 0625  cefoTEtan in Dextrose 5% (CEFOTAN) IVPB 2 g  Status:  Discontinued     2 g Intravenous Every 12 hours 07/01/14 0625 07/01/14 1413      Medications: Scheduled Meds: . acetaminophen  1,000 mg Oral TID  . bismuth subsalicylate  30 mL Oral BID AC  . enoxaparin (LOVENOX) injection  50 mg Subcutaneous Q24H  . losartan  25 mg Oral QHS   And  . hydrochlorothiazide  6.25 mg Oral QHS  . imipenem-cilastatin  500 mg Intravenous Q6H  . insulin aspart  0-5 Units Subcutaneous QHS  . insulin aspart  0-9 Units Subcutaneous TID WC  . linezolid  600 mg Intravenous Q12H  . lip balm  1 application Topical BID  . micafungin [facility](MYCAMINE) IV  150 mg Intravenous Q24H  . psyllium  1 packet Oral Daily  . saccharomyces boulardii  250 mg Oral BID   Continuous Infusions: . sodium chloride 10 mL/hr at 07/23/14 0625  . fat emulsion Stopped (07/23/14 1745)  . Marland Kitchen.TPN (CLINIMIX-E) Adult 40 mL/hr at 07/23/14 1746   And  . fat emulsion 250 mL (  07/23/14 1745)  . Marland KitchenTPN (CLINIMIX-E) Adult Stopped (07/23/14 1745)   PRN Meds:.alum & mag hydroxide-simeth, colchicine, diphenhydrAMINE, diphenhydrAMINE, LORazepam, magic mouthwash, menthol-cetylpyridinium, metoprolol, morphine injection, ondansetron (ZOFRAN) IV, ondansetron, ondansetron, phenol, promethazine, sodium chloride, zolpidem    Objective: Weight change:   Intake/Output Summary (Last 24 hours) at 07/23/14 1758 Last data filed at 07/23/14 1726  Gross per 24 hour  Intake 1356.67 ml  Output      0 ml  Net 1356.67 ml   Blood pressure 103/52, pulse 74, temperature 98.2 F (36.8 C), temperature source Oral, resp. rate 18, height 6' (1.829 m), weight 227 lb 15.3 oz (103.4 kg), SpO2 99.00%. Temp:  [97.9 F (36.6 C)-98.3 F (36.8 C)] 98.2 F (36.8 C) (10/14 1549) Pulse Rate:  [69-89] 74 (10/14 1549) Resp:  [17-18] 18 (10/14 1549) BP: (99-127)/(52-78) 103/52 mmHg (10/14 1549) SpO2:  [98 %-100  %] 99 % (10/14 1549)  Physical Exam: General: patient was napping, discussed CT findings with him Neuro: nonfocal, strength and sensation intact   CBC:  CBC Latest Ref Rng 07/23/2014 07/22/2014 07/21/2014  WBC 4.0 - 10.5 K/uL 19.1(H) 23.9(H) 22.0(H)  Hemoglobin 13.0 - 17.0 g/dL 9.0(L) 9.1(L) 9.0(L)  Hematocrit 39.0 - 52.0 % 26.7(L) 27.9(L) 27.5(L)  Platelets 150 - 400 K/uL 531(H) 609(H) 673(H)      BMET  Recent Labs  07/21/14 0334 07/22/14 0445  NA 132* 134*  K 4.0 4.1  CL 96 96  CO2 25 24  GLUCOSE 132* 135*  BUN 15 17  CREATININE 1.02 1.01  CALCIUM 9.1 9.1     Liver Panel   Recent Labs  07/21/14 0334  PROT 7.1  ALBUMIN 2.5*  AST 16  ALT 13  ALKPHOS 80  BILITOT 0.3       Sedimentation Rate No results found for this basename: ESRSEDRATE,  in the last 72 hours C-Reactive Protein No results found for this basename: CRP,  in the last 72 hours  Micro Results: Recent Results (from the past 720 hour(s))  CLOSTRIDIUM DIFFICILE BY PCR     Status: None   Collection Time    07/11/14  9:45 AM      Result Value Ref Range Status   C difficile by pcr NEGATIVE  NEGATIVE Final   Comment: Performed at [facility]  CULTURE, ROUTINE-ABSCESS     Status: None   Collection Time    07/12/14  8:53 AM      Result Value Ref Range Status   Specimen Description ABSCESS PERITONEAL   Final   Special Requests Normal   Final   Gram Stain     Final   Value: MODERATE WBC PRESENT, PREDOMINANTLY MONONUCLEAR     FEW WBC PRESENT, PREDOMINANTLY PMN     NO     SQUAMOUS EPITHELIAL CELLS PRESENT     MODERATE GRAM NEGATIVE RODS   Culture     Final   Value: ABUNDANT ESCHERICHIA COLI     Note: Confirmed Extended Spectrum Beta-Lactamase Producer (ESBL) CRITICAL RESULT CALLED TO, READ BACK BY AND VERIFIED WITH: KATHERINE STRUPE 07/14/14 1115 BY SMITHERSJ     Performed at [facility]   Report Status 07/14/2014 FINAL   Final   Organism ID, Bacteria ESCHERICHIA COLI    Final  CLOSTRIDIUM DIFFICILE BY PCR     Status: None   Collection Time    07/17/14  3:32 PM      Result Value Ref Range Status   C difficile by pcr NEGATIVE  NEGATIVE Final  Comment: Performed at [facility]  CULTURE, BLOOD (ROUTINE X 2)     Status: None   Collection Time    07/18/14  8:59 AM      Result Value Ref Range Status   Specimen Description BLOOD LEFT ARM   Final   Special Requests BOTTLES DRAWN AEROBIC AND ANAEROBIC 10CC EA   Final   Culture  Setup Time     Final   Value: 07/18/2014 12:19     Performed at [facility]   Culture     Final   Value:        BLOOD CULTURE RECEIVED NO GROWTH TO DATE CULTURE WILL BE HELD FOR 5 DAYS BEFORE ISSUING A FINAL NEGATIVE REPORT     Performed at [facility]   Report Status PENDING   Incomplete  CULTURE, BLOOD (ROUTINE X 2)     Status: None   Collection Time    07/18/14  9:03 AM      Result Value Ref Range Status   Specimen Description BLOOD LEFT ARM   Final   Special Requests BOTTLES DRAWN AEROBIC AND ANAEROBIC 10CC EA   Final   Culture  Setup Time     Final   Value: 07/18/2014 12:19     Performed at [facility]   Culture     Final   Value:        BLOOD CULTURE RECEIVED NO GROWTH TO DATE CULTURE WILL BE HELD FOR 5 DAYS BEFORE ISSUING A FINAL NEGATIVE REPORT     Performed at [facility]   Report Status PENDING   Incomplete  CULTURE, ROUTINE-ABSCESS     Status: None   Collection Time    07/18/14 11:03 AM      Result Value Ref Range Status   Specimen Description ABDOMEN JPD   Final   Special Requests NONE   Final   Gram Stain     Final   Value: RARE WBC PRESENT, PREDOMINANTLY PMN     NO SQUAMOUS EPITHELIAL CELLS SEEN     MODERATE GRAM NEGATIVE RODS     Performed at [facility]   Culture     Final   Value: ABUNDANT ESCHERICHIA COLI     Note: Confirmed Extended Spectrum Beta-Lactamase Producer (ESBL) CRITICAL RESULT CALLED TO, READ BACK BY AND VERIFIED WITH: PAULA ARMSTRONG @  0900 ON [id number] BY [facility]     Performed at [facility]   Report Status 07/20/2014 FINAL   Final   Organism ID, Bacteria ESCHERICHIA COLI   Final  URINE CULTURE     Status: None   Collection Time    07/18/14  9:04 PM      Result Value Ref Range Status   Specimen Description URINE, CLEAN CATCH   Final   Special Requests Normal   Final   Culture  Setup Time     Final   Value: 07/19/2014 00:25     Performed at [facility] Count     Final   Value: 60,000 COLONIES/ML     Performed at [facility]   Culture     Final   Value: YEAST     Performed at [facility]   Report Status 07/20/2014 FINAL   Final    Studies/Results: Ct Abdomen Pelvis W Contrast  07/23/2014   CLINICAL DATA:  Intraabdominal abscess s/p drain placement; ileus subtotal colectomy 07/01/2014. Postoperative abscess adjacent to the pancreas.  EXAM:  CT ABDOMEN AND PELVIS WITH CONTRAST  TECHNIQUE: Multidetector CT imaging of the abdomen and pelvis was performed using the standard protocol following bolus administration of intravenous contrast.  CONTRAST:  100mL OMNIPAQUE IOHEXOL 300 MG/ML  SOLN  COMPARISON:  Multiple priors.  Most recent CT 07/16/2014.  FINDINGS: Atelectasis the lung bases.  Pigtail drainage catheter anterior to the pancreas in the abscess. The drainage catheter is unchanged in position compared to prior.  The abscess is best visualized on coronal images and compared to the prior exam from 07/16/2014. When direct comparison is made, the abscess appears slightly smaller in the craniocaudal dimension, now measuring 33 mm, previously 42 mm. On axial images, the abscess has changed configuration slightly. Axial measurement today is 32 mm x 42 mm, previously 37 mm x 34 mm.  There is persistent distention of small bowel compatible with ileus.  IMPRESSION: 1. Unchanged pigtail drainage catheter in abscess anterior to the body of the pancreas. The abscess has slightly decreased in size  compared 07/16/2014. 2. Persistent ileus, not changed compared to prior CT.   Electronically Signed   By: Andreas NewportGeoffrey  Lamke M.D.   On: 07/23/2014 12:26      Assessment/Plan:  Principal Problem:   Diverticulitis of left colon s/p lap assisted subtotal colectomy 07/01/2014 Active Problems:   Sarcoidosis   Hypertension   Postoperative intra-abdominal abscess - ESBL resistent Ecoli   Superficial venous thrombosis of arm   Pancreatitis, acute   Protein-calorie malnutrition, moderate    [name] [name] is a 48 y.o. male with Complicated diverticulitis sp LAP total colectomy, then found to have intraabdominal abscess with ESBL. Dr Abbey Chattersosenbower was worried this last week about patient perhaps having other organisms involved in the abscess besides the ESBL. He had brought the patient out to imipenem to cover sit for Pseudomonas. After thorough discussion we proceeded to add  in MRSA coverage with Zyvox and antifungal coverage with micafungin.   Since then pts WBC has gone down and up and down again.  He had superficial thrombus that has not extended  Repeat CT today shows stability of the abscess in size   #1 Intraabdominal abscess with ESBL: His WBC remains up despite drain in place and  VERY broad spectrum abx  He is 10 days into rx for ESBL  I wonder if indeed upsizing the drain might hasten clearance of his abscess      LOS: 22 days   [name] [name] 07/23/2014, 5:58 PM

## 2014-07-23 NOTE — Progress Notes (Signed)
PARENTERAL NUTRITION CONSULT NOTE  Pharmacy Consult for TPN Indication: Prolonged postoperative ileus  Allergies  Allergen Reactions  . Celebrex [Celecoxib] Other (See Comments)    "Mom is allergic, so I do not take it"  . Sulfa Antibiotics Nausea And Vomiting  . Codeine Rash  . Dilaudid [Hydromorphone Hcl] Nausea And Vomiting and Anxiety   Patient Measurements: Height: 6' (182.9 cm) Weight: 227 lb 15.3 oz (103.4 kg) IBW/kg (Calculated) : 77.6  Vital Signs: Temp: 97.9 F (36.6 C) (10/14 0617) Temp Source: Oral (10/14 0617) BP: 99/63 mmHg (10/14 0617) Pulse Rate: 69 (10/14 0617) Intake/Output from previous day: 10/13 0701 - 10/14 0700 In: 1620 [I.V.:230; IV Piggyback:500; TPN:880] Out: 177 [Urine:175; Drains:2] Intake/Output from this shift:   Labs:  Recent Labs  07/21/14 0334 07/22/14 0445 07/23/14 0525  WBC 22.0* 23.9* 19.1*  HGB 9.0* 9.1* 9.0*  HCT 27.5* 27.9* 26.7*  PLT 673* 609* 531*    Recent Labs  07/21/14 0334 07/21/14 0335 07/22/14 0445  NA 132*  --  134*  K 4.0  --  4.1  CL 96  --  96  CO2 25  --  24  GLUCOSE 132*  --  135*  BUN 15  --  17  CREATININE 1.02  --  1.01  CALCIUM 9.1  --  9.1  MG 2.1  --   --   PHOS 3.9  --   --   PROT 7.1  --   --   ALBUMIN 2.5*  --   --   AST 16  --   --   ALT 13  --   --   ALKPHOS 80  --   --   BILITOT 0.3  --   --   PREALBUMIN 13.5*  --   --   TRIG  --  58  --    Estimated Creatinine Clearance: 113.6 ml/min (by C-G formula based on Cr of 1.01).    Recent Labs  07/22/14 1710 07/22/14 2201 07/23/14 0616  GLUCAP 124* 143* 127*   Insulin Requirements in past 24 hrs: sensitive SSI achs: 3 units   Current Nutrition: Thin liquids (10/11), advance to pureed (10/12) + Ensure Complete TID  IVF: NS at 10 ml/hr  Central access: PICC placed 9/29 TPN start date: 9/29  ASSESSMENT                                                                                                       HPI:  [id number] yoM with  recurrent sigmoid diverticulitis. S/p lap assisted subtotal colectomy on 9/22 with subsequent postop ileus. Initially, TPN was started 9/27 and stopped 10/7 when he was tolerating an enteral diet.  Pharmacy was consulted to resume TPN on 10/9 following recurrent ileus on 10/8.    Significant events:  9/27: Start TPN 10/2: Passing flatus, multiple loose BMs.  CT w/ fluid collection. 10/6: tolerating FLD, advancing to soft diet. TPN wean 10/7: TPN stopped 10/9: resume TPN for recurrent ileus, NGT to suction 10/11: NG clamping trial, starting thin liquids - 480cc intake charted 10/12: Diet advanced  to pureed - 25% dinner charted. Repeat UE doppler to evaluate for clot extension; may need to remove PICC. 10/13: UE doppler completed - no propagation noted. Calorie count in progress. Tolerating some soft foods and one Ensure. Per CCS, begin weaning TNA slowly over next 48 hours. 10/14: Repeating abdominal CT.  Today 10/14:  Glucose - at goal of < 150   Electrolytes - Na slightly low, all others WNL (10/13)  Renal - SCr improving daily  LFTs - WNL   TGs - WNL (9/30, 10/5, 10/8, 10/9, 10/12)  Prealbumin - 13.9 (9/30),  14.4 (10/5), 14.7 (10/9), 13.5 (10/12)  NUTRITIONAL GOALS                                                                                    RD recs: 1950-2250 Kcal, 120-140g Protein, 1.9-2.2 L/day fluid Clinimix 5/15 at a goal rate of 15100ml/hr + 20% fat emulsion at ml/hr to provide: 120 g/day protein, 2184 Kcal/day.  PLAN                                                                                                                  Decrease Clinimix E 5/15 to 40 ml/hr with plans to stop 10/15 at 18:00 unless otherwise specified by CCS  Decrease 20% fat emulsion to 5 ml/hr.  Standard multivitamins and trace elements added to TPN  Continue CBGs and sensitive scale ACHS  IVF per MD orders  TNA lab panels on Mondays & Thursdays.  Loralee PacasErin Sovereign Ramiro, PharmD, BCPS Pager:  [phone]  07/23/2014 7:28 AM

## 2014-07-23 NOTE — Progress Notes (Signed)
NUTRITION FOLLOW UP  Intervention:   TPN per pharmacy   D/C order of Ensure Complete po TID, each supplement provides 350 kcal and 13 grams of protein  Provide Magic cup TID with meals, each supplement provides 290 kcal and 9 grams of protein  RD to continue to monitor   New nutrition dx: Increased nutrient needs related to diverticulitis with abscess as evidenced by MD notes, ongoing   Goal:   1. TPN to meet >90% of estimated nutritional needs - met 2. Advance diet as tolerated to soft diet - met  New goal: Pt to consume >90% of meals/supplements, unmet  Monitor:   TPN regimen, PO intake, Weights, labs  Assessment:   Pt with recurrent sigmoid diverticulitis, admitted for subtotal colectomy which was performed 9/22. Developed postop ileus. Had some crampy upper abdominal pain then nausea and vomiting-basically everything he ate per MD.   TPN re-initiated 10/9 d/t recurrent ileus. Diet was advanced to soft 10/12.  Pt is receiving Clinimix 5/15 at a goal rate of 83 ml/hr + 20% fat emulsion at 80m/hr which provides: 100 g/day protein, 1894 Kcal/day.  Per pharmacy, plan to decrease Clinimix E 5/15 to 40 ml/hr with plans to stop 10/15 at 18:00 unless otherwise specified by CCS and decrease 20% fat emulsion to 5 ml/hr.  Unable to speak with pt during visit, pt was not in room at the time.  Pt continues to have poor PO's per calorie count and intake documentation. Pt's lunch tray was still in room, RD noticed tray to be barely touched except for the dessert. Per documentation, pt is refusing Ensure supplements. Will try another supplement, magic cup TID.  Recommend continuing TPN if pt continues to eat <25% of meals.  Labs reviewed: Low Na Glucose 135  Height: Ht Readings from Last 1 Encounters:  07/01/14 6' (1.829 m)    Weight Status:   Wt Readings from Last 1 Encounters:  07/01/14 227 lb 15.3 oz (103.4 kg)    Re-estimated needs:  Kcal: [address] Protein: 120-140g   Fluid: 1.9-2.2L/day   Skin: surgical incision on abdomen   Diet Order: [name]  Intake/Output Summary (Last 24 hours) at 07/23/14 1420 Last data filed at 07/23/14 1310  Gross per 24 hour  Intake 1241.67 ml  Output      2 ml  Net 1239.67 ml    Last BM: 10/13   Labs:   Recent Labs Lab 07/18/14 0430 07/18/14 1244 07/19/14 0420 07/20/14 0421 07/21/14 0334 07/22/14 0445  NA 133* 134* 137 138 132* 134*  K 4.8 5.0 4.2 4.0 4.0 4.1  CL 95* 97 101 102 96 96  CO2 _0 BUN 28* 25* _1 CREATININE 1.37* 1.31 1.19 1.06 1.02 1.01  CALCIUM 9.3 9.1 8.8 8.8 9.1 9.1  MG 2.3  --  2.3  --  2.1  --   PHOS 5.0* 3.5 3.4  --  3.9  --   GLUCOSE 135* 145* 134* 124* 132* 135*    CBG (last 3)   Recent Labs  07/23/14 0616 07/23/14 0731 07/23/14 1226  GLUCAP 127* 116* 112*    Scheduled Meds: . acetaminophen  1,000 mg Oral TID  . bismuth subsalicylate  30 mL Oral BID AC  . enoxaparin (LOVENOX) injection  50 mg Subcutaneous Q24H  . feeding supplement (ENSURE COMPLETE)  237 mL Oral TID WC  . losartan  25 mg Oral QHS   And  . hydrochlorothiazide  6.25 mg Oral QHS  . imipenem-cilastatin  500 mg Intravenous Q6H  . insulin aspart  0-5 Units Subcutaneous QHS  . insulin aspart  0-9 Units Subcutaneous TID WC  . linezolid  600 mg Intravenous Q12H  . lip balm  1 application Topical BID  . micafungin [facility]) IV  150 mg Intravenous Q24H  . psyllium  1 packet Oral Daily  . saccharomyces boulardii  250 mg Oral BID   . sodium chloride 10 mL/hr at 07/23/14 0625  . fat emulsion 250 mL (07/22/14 1706)  . Marland KitchenTPN (CLINIMIX-E) Adult     And  . fat emulsion    . Marland KitchenTPN (CLINIMIX-E) Adult 83 mL/hr at 07/22/14 1707    [name] Bibles, MS, RD, LDN Pager: [phone] After Hours Pager: [phone]

## 2014-07-23 NOTE — Progress Notes (Signed)
22 Days Post-Op  Subjective: Felt bloated yesterday.  Feeling better from that standpoint today.  Objective: Vital signs in last 24 hours: Temp:  [97.9 F (36.6 C)-98.4 F (36.9 C)] 97.9 F (36.6 C) (10/14 0617) Pulse Rate:  [69-89] 69 (10/14 0617) Resp:  [17-18] 18 (10/14 0617) BP: (99-127)/(63-78) 99/63 mmHg (10/14 0617) SpO2:  [98 %-100 %] 98 % (10/14 0617) Last BM Date: 07/22/14  Intake/Output from previous day: 10/13 0701 - 10/14 0700 In: 1620 [I.V.:230; IV Piggyback:500; TPN:880] Out: 177 [Urine:175; Drains:2] Intake/Output this shift:    PE: General- In NAD Abdomen-soft, not distended, active bowel sounds, wounds clean and intact, clear drain output  Lab Results:   Recent Labs  07/22/14 0445 07/23/14 0525  WBC 23.9* 19.1*  HGB 9.1* 9.0*  HCT 27.9* 26.7*  PLT 609* 531*   BMET  Recent Labs  07/21/14 0334 07/22/14 0445  NA 132* 134*  K 4.0 4.1  CL 96 96  CO2 25 24  GLUCOSE 132* 135*  BUN 15 17  CREATININE 1.02 1.01  CALCIUM 9.1 9.1   PT/INR No results found for this basename: LABPROT, INR,  in the last 72 hours Comprehensive Metabolic Panel:    Component Value Date/Time   NA 134* 07/22/2014 0445   NA 132* 07/21/2014 0334   K 4.1 07/22/2014 0445   K 4.0 07/21/2014 0334   CL 96 07/22/2014 0445   CL 96 07/21/2014 0334   CO2 24 07/22/2014 0445   CO2 25 07/21/2014 0334   BUN 17 07/22/2014 0445   BUN 15 07/21/2014 0334   CREATININE 1.01 07/22/2014 0445   CREATININE 1.02 07/21/2014 0334   GLUCOSE 135* 07/22/2014 0445   GLUCOSE 132* 07/21/2014 0334   CALCIUM 9.1 07/22/2014 0445   CALCIUM 9.1 07/21/2014 0334   AST 16 07/21/2014 0334   AST 12 07/19/2014 0420   ALT 13 07/21/2014 0334   ALT 16 07/19/2014 0420   ALKPHOS 80 07/21/2014 0334   ALKPHOS 89 07/19/2014 0420   BILITOT 0.3 07/21/2014 0334   BILITOT 0.3 07/19/2014 0420   PROT 7.1 07/21/2014 0334   PROT 6.9 07/19/2014 0420   ALBUMIN 2.5* 07/21/2014 0334   ALBUMIN 2.5* 07/19/2014 0420      Studies/Results: No results found.  Anti-infectives: Anti-infectives   Start     Dose/Rate Route Frequency Ordered Stop   07/20/14 2200  imipenem-cilastatin (PRIMAXIN) 500 mg in sodium chloride 0.9 % 100 mL IVPB     500 mg 200 mL/hr over 30 Minutes Intravenous Every 6 hours 07/20/14 1652     07/18/14 1100  micafungin (MYCAMINE) 150 mg in sodium chloride 0.9 % 100 mL IVPB     150 mg 100 mL/hr over 1 Hours Intravenous Every 24 hours 07/18/14 0907     07/18/14 1000  linezolid (ZYVOX) IVPB 600 mg     600 mg 300 mL/hr over 60 Minutes Intravenous Every 12 hours 07/18/14 0907     07/17/14 2000  imipenem-cilastatin (PRIMAXIN) 500 mg in sodium chloride 0.9 % 100 mL IVPB  Status:  Discontinued     500 mg 200 mL/hr over 30 Minutes Intravenous Every 6 hours 07/17/14 1743 07/20/14 1652   07/14/14 1600  ertapenem (INVANZ) 1 g in sodium chloride 0.9 % 50 mL IVPB  Status:  Discontinued     1 g 100 mL/hr over 30 Minutes Intravenous Every 24 hours 07/14/14 1457 07/17/14 1743   07/14/14 0800  piperacillin-tazobactam (ZOSYN) IVPB 3.375 g  Status:  Discontinued  3.375 g 12.5 mL/hr over 240 Minutes Intravenous Every 8 hours 07/14/14 0723 07/14/14 1456   07/08/14 2100  vancomycin (VANCOCIN) IVPB 1000 mg/200 mL premix  Status:  Discontinued     1,000 mg 200 mL/hr over 60 Minutes Intravenous Every 12 hours 07/08/14 1104 07/10/14 0832   07/08/14 0800  vancomycin (VANCOCIN) 2,000 mg in sodium chloride 0.9 % 500 mL IVPB     2,000 mg 250 mL/hr over 120 Minutes Intravenous  Once 07/08/14 0757 07/08/14 1107   07/07/14 1000  piperacillin-tazobactam (ZOSYN) IVPB 3.375 g  Status:  Discontinued     3.375 g 12.5 mL/hr over 240 Minutes Intravenous Every 8 hours 07/07/14 0821 07/10/14 0832   07/01/14 2000  cefoTEtan (CEFOTAN) 2 g in dextrose 5 % 50 mL IVPB     2 g 100 mL/hr over 30 Minutes Intravenous Every 12 hours 07/01/14 1339 07/01/14 2015   07/01/14 0625  cefoTEtan in Dextrose 5% (CEFOTAN) IVPB 2 g   Status:  Discontinued     2 g Intravenous Every 12 hours 07/01/14 [id number] 07/01/14 1413      Assessment Principal Problem:   Diverticulitis of left colon s/p lap assisted subtotal colectomy 07/01/2014-has had a prolonged ileus which is slowly getting better and likely related to the abscess    Postoperative intra-abdominal abscess - ESBL resistent Ecoli; on broad spectrum abxs; WBC has been fluctuating and is down some today; not much output from drain    Small superficial venous thrombosis of vein in forearm-no palpable cord; 10/13 doppler shows no extension of small superficial vein thrombosis    Protein-calorie malnutrition, moderate-still on TPN as oral intake is limited.    LOS: 22 days   Plan: Repeat CT today.  Increase ambulation.  Continue abxs and TPN.    Deren Degrazia J 07/23/2014

## 2014-07-24 ENCOUNTER — Inpatient Hospital Stay (HOSPITAL_COMMUNITY): Payer: BC Managed Care – PPO

## 2014-07-24 LAB — CULTURE, BLOOD (ROUTINE X 2)
Culture: NO GROWTH
Culture: NO GROWTH

## 2014-07-24 LAB — CBC
HCT: 28 % — ABNORMAL LOW (ref 39.0–52.0)
HEMOGLOBIN: 9 g/dL — AB (ref 13.0–17.0)
MCH: 28.2 pg (ref 26.0–34.0)
MCHC: 32.1 g/dL (ref 30.0–36.0)
MCV: 87.8 fL (ref 78.0–100.0)
PLATELETS: 511 10*3/uL — AB (ref 150–400)
RBC: 3.19 MIL/uL — ABNORMAL LOW (ref 4.22–5.81)
RDW: 14.1 % (ref 11.5–15.5)
WBC: 14.5 10*3/uL — AB (ref 4.0–10.5)

## 2014-07-24 LAB — GLUCOSE, CAPILLARY
GLUCOSE-CAPILLARY: 74 mg/dL (ref 70–99)
Glucose-Capillary: 158 mg/dL — ABNORMAL HIGH (ref 70–99)
Glucose-Capillary: 85 mg/dL (ref 70–99)
Glucose-Capillary: 96 mg/dL (ref 70–99)
Glucose-Capillary: 90 mg/dL (ref 70–99)
Glucose-Capillary: 91 mg/dL (ref 70–99)

## 2014-07-24 LAB — COMPREHENSIVE METABOLIC PANEL
ALT: 24 U/L (ref 0–53)
ANION GAP: 13 (ref 5–15)
AST: 22 U/L (ref 0–37)
Albumin: 2.4 g/dL — ABNORMAL LOW (ref 3.5–5.2)
Alkaline Phosphatase: 78 U/L (ref 39–117)
BUN: 14 mg/dL (ref 6–23)
CO2: 25 mEq/L (ref 19–32)
CREATININE: 0.99 mg/dL (ref 0.50–1.35)
Calcium: 9.2 mg/dL (ref 8.4–10.5)
Chloride: 98 mEq/L (ref 96–112)
GFR calc Af Amer: >90 mL/min (ref >90)
GFR calc non Af Amer: >90 mL/min (ref >90)
Glucose, Bld: 86 mg/dL (ref 70–99)
Potassium: 3.8 mEq/L (ref 3.7–5.3)
Sodium: 136 mEq/L — ABNORMAL LOW (ref 137–147)
TOTAL PROTEIN: 7.1 g/dL (ref 6.0–8.3)
Total Bilirubin: 0.2 mg/dL — ABNORMAL LOW (ref 0.3–1.2)

## 2014-07-24 LAB — MAGNESIUM: Magnesium: 2.4 mg/dL (ref 1.5–2.5)

## 2014-07-24 LAB — PHOSPHORUS: PHOSPHORUS: 4.4 mg/dL (ref 2.3–4.6)

## 2014-07-24 MED ORDER — PROMETHAZINE HCL 25 MG/ML IJ SOLN: 12.5000 mg | INTRAMUSCULAR | Status: DC | PRN

## 2014-07-24 MED ORDER — TRACE MINERALS CR-CU-F-FE-I-MN-MO-SE-ZN IV SOLN
INTRAVENOUS | Status: AC
  Administered 2014-07-24: 18:00:00 via INTRAVENOUS
  Filled 2014-07-24: qty 1000

## 2014-07-24 MED ORDER — IOHEXOL 300 MG/ML  SOLN: 20.0000 mL | Freq: Once | INTRAMUSCULAR | Status: AC | PRN

## 2014-07-24 MED ORDER — FAT EMULSION 20 % IV EMUL
250.0000 mL | INTRAVENOUS | Status: AC
  Administered 2014-07-24: 250 mL via INTRAVENOUS
  Filled 2014-07-24: qty 250

## 2014-07-24 MED ORDER — LIDOCAINE HCL 1 % IJ SOLN
INTRAMUSCULAR | Status: AC
  Filled 2014-07-24: qty 20

## 2014-07-24 NOTE — Progress Notes (Signed)
PARENTERAL NUTRITION CONSULT NOTE  Pharmacy Consult for TPN Indication: Prolonged postoperative ileus  Allergies  Allergen Reactions  . Celebrex [Celecoxib] Other (See Comments)    "Mom is allergic, so I do not take it"  . Sulfa Antibiotics Nausea And Vomiting  . Codeine Rash  . Dilaudid [Hydromorphone Hcl] Nausea And Vomiting and Anxiety   Patient Measurements: Height: 6' (182.9 cm) Weight: 227 lb 15.3 oz (103.4 kg) IBW/kg (Calculated) : 77.6  Vital Signs: Temp: 97.9 F (36.6 C) (10/15 0630) Temp Source: Oral (10/15 0630) BP: 108/60 mmHg (10/15 0630) Pulse Rate: 72 (10/15 0630) Intake/Output from previous day: 10/14 0701 - 10/15 0700 In: 1311.7 [P.O.:120; I.V.:191.7; IV Piggyback:1000] Out: -  Intake/Output from this shift:   Labs:  Recent Labs  07/22/14 0445 07/23/14 0525 07/24/14 0525  WBC 23.9* 19.1* 14.5*  HGB 9.1* 9.0* 9.0*  HCT 27.9* 26.7* 28.0*  PLT 609* 531* 511*    Recent Labs  07/22/14 0445 07/24/14 0525  NA 134* 136*  K 4.1 3.8  CL 96 98  CO2 24 25  GLUCOSE 135* 86  BUN 17 14  CREATININE 1.01 0.99  CALCIUM 9.1 9.2  MG  --  2.4  PHOS  --  4.4  PROT  --  7.1  ALBUMIN  --  2.4*  AST  --  22  ALT  --  24  ALKPHOS  --  78  BILITOT  --  0.2*   Estimated Creatinine Clearance: 115.9 ml/min (by C-G formula based on Cr of 0.99).    Recent Labs  07/24/14 0215 07/24/14 0644 07/24/14 0753  GLUCAP 158* 85 96   Insulin Requirements in past 24 hrs: sensitive SSI achs: 0 units   Current Nutrition: Thin liquids (10/11), advance to pureed (10/12) + Ensure Complete TID NPO this am for drain study  IVF: NS at 10 ml/hr  Central access: PICC placed 9/29 TPN start date: 9/29  ASSESSMENT                                                                                                       HPI:  [name] with recurrent sigmoid diverticulitis. S/p lap assisted subtotal colectomy on 9/22 with subsequent postop ileus. Initially, TPN was started 9/27  and stopped 10/7 when he was tolerating an enteral diet.  Pharmacy was consulted to resume TPN on 10/9 following recurrent ileus on 10/8.    Significant events:  9/27: Start TPN 10/2: Passing flatus, multiple loose BMs.  CT w/ fluid collection. 10/6: tolerating FLD, advancing to soft diet. TPN wean 10/7: TPN stopped 10/9: resume TPN for recurrent ileus, NGT to suction 10/11: NG clamping trial, starting thin liquids - 480cc intake charted 10/12: Diet advanced to pureed - 25% dinner charted. Repeat UE doppler to evaluate for clot extension; may need to remove PICC. 10/13: UE doppler completed - no propagation noted. Calorie count in progress. Tolerating some soft foods and one Ensure. Per CCS, begin weaning TNA slowly over next 48 hours. 10/14: Repeating abdominal CT. 10/15: NPO for drain study, continue TNA  Today 10/15:  Glucose - at goal of < 150 (one outlier of 158)  Electrolytes - Na slightly low, all others WNL   Renal - SCr improving daily  LFTs - WNL   TGs - WNL (9/30, 10/5, 10/8, 10/9, 10/12)  Prealbumin - 13.9 (9/30),  14.4 (10/5), 14.7 (10/9), 13.5 (10/12)  NUTRITIONAL GOALS                                                                                    RD recs: 1950-2250 Kcal, 120-140g Protein, 1.9-2.2 L/day fluid Clinimix 5/15 at a goal rate of 16700ml/hr + 20% fat emulsion at ml/hr to provide: 120 g/day protein, 2184 Kcal/day.  PLAN                                                                                                                  Continue Clinimix E 5/15 at 40 ml/hr, consider wean and d/c tomorrow  Continue 20% fat emulsion at 5 ml/hr.  Standard multivitamins and trace elements added to TPN  Continue CBGs and sensitive scale ACHS  IVF per MD orders  TNA lab panels on Mondays & Thursdays.  Otho BellowsGreen, Nakia Koble L PharmD Pager [phone] 07/24/2014, 11:02 AM

## 2014-07-24 NOTE — Progress Notes (Signed)
[facility]  Day # 7  imipenem Day # 7 zyvox Day # 7  micafungin  4 day of Invanz  Subjective: Feels better   Antibiotics:  Anti-infectives   Start     Dose/Rate Route Frequency Ordered Stop   07/20/14 2200  imipenem-cilastatin (PRIMAXIN) 500 mg in sodium chloride 0.9 % 100 mL IVPB     500 mg 200 mL/hr over 30 Minutes Intravenous Every 6 hours 07/20/14 1652     07/18/14 1100  micafungin (MYCAMINE) 150 mg in sodium chloride 0.9 % 100 mL IVPB     150 mg 100 mL/hr over 1 Hours Intravenous Every 24 hours 07/18/14 0907     07/18/14 1000  linezolid (ZYVOX) IVPB 600 mg     600 mg 300 mL/hr over 60 Minutes Intravenous Every 12 hours 07/18/14 0907     07/17/14 2000  imipenem-cilastatin (PRIMAXIN) 500 mg in sodium chloride 0.9 % 100 mL IVPB  Status:  Discontinued     500 mg 200 mL/hr over 30 Minutes Intravenous Every 6 hours 07/17/14 1743 07/20/14 1652   07/14/14 1600  ertapenem (INVANZ) 1 g in sodium chloride 0.9 % 50 mL IVPB  Status:  Discontinued     1 g 100 mL/hr over 30 Minutes Intravenous Every 24 hours 07/14/14 1457 07/17/14 1743   07/14/14 0800  piperacillin-tazobactam (ZOSYN) IVPB 3.375 g  Status:  Discontinued     3.375 g 12.5 mL/hr over 240 Minutes Intravenous Every 8 hours 07/14/14 0723 07/14/14 1456   07/08/14 2100  vancomycin (VANCOCIN) IVPB 1000 mg/200 mL premix  Status:  Discontinued     1,000 mg 200 mL/hr over 60 Minutes Intravenous Every 12 hours 07/08/14 1104 07/10/14 0832   07/08/14 0800  vancomycin (VANCOCIN) 2,000 mg in sodium chloride 0.9 % 500 mL IVPB     2,000 mg 250 mL/hr over 120 Minutes Intravenous  Once 07/08/14 0757 07/08/14 1107   07/07/14 1000  piperacillin-tazobactam (ZOSYN) IVPB 3.375 g  Status:  Discontinued     3.375 g 12.5 mL/hr over 240 Minutes Intravenous Every 8 hours 07/07/14 0821 07/10/14 0832   07/01/14 2000  cefoTEtan (CEFOTAN) 2 g in dextrose 5 % 50 mL IVPB     2 g 100 mL/hr over 30 Minutes Intravenous Every  12 hours 07/01/14 1339 07/01/14 2015   07/01/14 0625  cefoTEtan in Dextrose 5% (CEFOTAN) IVPB 2 g  Status:  Discontinued     2 g Intravenous Every 12 hours 07/01/14 0625 07/01/14 1413      Medications: Scheduled Meds: . acetaminophen  1,000 mg Oral TID  . bismuth subsalicylate  30 mL Oral BID AC  . enoxaparin (LOVENOX) injection  50 mg Subcutaneous Q24H  . losartan  25 mg Oral QHS   And  . hydrochlorothiazide  6.25 mg Oral QHS  . imipenem-cilastatin  500 mg Intravenous Q6H  . insulin aspart  0-5 Units Subcutaneous QHS  . insulin aspart  0-9 Units Subcutaneous TID WC  . lidocaine      . linezolid  600 mg Intravenous Q12H  . lip balm  1 application Topical BID  . micafungin [facility]) IV  150 mg Intravenous Q24H  . psyllium  1 packet Oral Daily  . saccharomyces boulardii  250 mg Oral BID   Continuous Infusions: . sodium chloride 10 mL/hr at 07/23/14 0625  . Marland KitchenTPN (CLINIMIX-E) Adult 40 mL/hr at 07/24/14 1744   And  . fat emulsion 250 mL (07/24/14 1744)   PRN  Meds:.alum & mag hydroxide-simeth, colchicine, diphenhydrAMINE, diphenhydrAMINE, iohexol, LORazepam, magic mouthwash, menthol-cetylpyridinium, metoprolol, morphine injection, ondansetron (ZOFRAN) IV, ondansetron, ondansetron, phenol, promethazine, sodium chloride, zolpidem    Objective: Weight change:   Intake/Output Summary (Last 24 hours) at 07/24/14 1851 Last data filed at 07/24/14 1800  Gross per 24 hour  Intake    260 ml  Output      0 ml  Net    260 ml   Blood pressure 118/80, pulse 73, temperature 97.8 F (36.6 C), temperature source Oral, resp. rate 18, height 6' (1.829 m), weight 227 lb 15.3 oz (103.4 kg), SpO2 100.00%. Temp:  [97.8 F (36.6 C)-98.5 F (36.9 C)] 97.8 F (36.6 C) (10/15 1400) Pulse Rate:  [59-74] 73 (10/15 1400) Resp:  [18-20] 18 (10/15 1400) BP: (101-125)/(58-80) 118/80 mmHg (10/15 1400) SpO2:  [98 %-100 %] 100 % (10/15 1400)  Physical Exam: General: patient was napping, discussed CT  findings with him Neuro: nonfocal, strength and sensation intact   CBC:  CBC Latest Ref Rng 07/24/2014 07/23/2014 07/22/2014  WBC 4.0 - 10.5 K/uL 14.5(H) 19.1(H) 23.9(H)  Hemoglobin 13.0 - 17.0 g/dL 9.0(L) 9.0(L) 9.1(L)  Hematocrit 39.0 - 52.0 % 28.0(L) 26.7(L) 27.9(L)  Platelets 150 - 400 K/uL 511(H) 531(H) 609(H)      BMET  Recent Labs  07/22/14 0445 07/24/14 0525  NA 134* 136*  K 4.1 3.8  CL 96 98  CO2 24 25  GLUCOSE 135* 86  BUN 17 14  CREATININE 1.01 0.99  CALCIUM 9.1 9.2     Liver Panel   Recent Labs  07/24/14 0525  PROT 7.1  ALBUMIN 2.4*  AST 22  ALT 24  ALKPHOS 78  BILITOT 0.2*       Sedimentation Rate No results found for this basename: ESRSEDRATE,  in the last 72 hours C-Reactive Protein No results found for this basename: CRP,  in the last 72 hours  Micro Results: Recent Results (from the past 720 hour(s))  CLOSTRIDIUM DIFFICILE BY PCR     Status: None   Collection Time    07/11/14  9:45 AM      Result Value Ref Range Status   C difficile by pcr NEGATIVE  NEGATIVE Final   Comment: Performed at [facility]  CULTURE, ROUTINE-ABSCESS     Status: None   Collection Time    07/12/14  8:53 AM      Result Value Ref Range Status   Specimen Description ABSCESS PERITONEAL   Final   Special Requests Normal   Final   Gram Stain     Final   Value: MODERATE WBC PRESENT, PREDOMINANTLY MONONUCLEAR     FEW WBC PRESENT, PREDOMINANTLY PMN     NO     SQUAMOUS EPITHELIAL CELLS PRESENT     MODERATE GRAM NEGATIVE RODS   Culture     Final   Value: ABUNDANT ESCHERICHIA COLI     Note: Confirmed Extended Spectrum Beta-Lactamase Producer (ESBL) CRITICAL RESULT CALLED TO, READ BACK BY AND VERIFIED WITH: KATHERINE STRUPE 07/14/14 1115 BY SMITHERSJ     Performed at [facility]   Report Status 07/14/2014 FINAL   Final   Organism ID, Bacteria ESCHERICHIA COLI   Final  CLOSTRIDIUM DIFFICILE BY PCR     Status: None   Collection Time    07/17/14   3:32 PM      Result Value Ref Range Status   C difficile by pcr NEGATIVE  NEGATIVE Final   Comment: Performed at [facility]  [facility]  CULTURE, BLOOD (ROUTINE X 2)     Status: None   Collection Time    07/18/14  8:59 AM      Result Value Ref Range Status   Specimen Description BLOOD LEFT ARM   Final   Special Requests BOTTLES DRAWN AEROBIC AND ANAEROBIC 10CC EA   Final   Culture  Setup Time     Final   Value: 07/18/2014 12:19     [facility]   Culture     Final   Value: NO GROWTH 5 DAYS     [facility]   Report Status 07/24/2014 FINAL   Final  CULTURE, BLOOD (ROUTINE X 2)     Status: None   Collection Time    07/18/14  9:03 AM      Result Value Ref Range Status   Specimen Description BLOOD LEFT ARM   Final   Special Requests BOTTLES DRAWN AEROBIC AND ANAEROBIC 10CC EA   Final   Culture  Setup Time     Final   Value: 07/18/2014 12:19     [facility]   Culture     Final   Value: NO GROWTH 5 DAYS     [facility]   Report Status 07/24/2014 FINAL   Final  CULTURE, ROUTINE-ABSCESS     Status: None   Collection Time    07/18/14 11:03 AM      Result Value Ref Range Status   Specimen Description ABDOMEN JPD   Final   Special Requests NONE   Final   Gram Stain     Final   Value: RARE WBC PRESENT, PREDOMINANTLY PMN     NO SQUAMOUS EPITHELIAL CELLS SEEN     MODERATE GRAM NEGATIVE RODS     [facility]formed at [facility]   Culture     Final   Value: ABUNDANT ESCHERICHIA COLI     Note: Confirmed Extended Spectrum Beta-Lactamase Producer (ESBL) CRITICAL RESULT CALLED TO, READ BACK BY AND VERIFIED WITH: PAULA ARMSTRONG @ 0900 ON [id number] BY [facility]     [facility]formed at [facility]   Report Status 07/20/2014 FINAL   Final   Organism ID, Bacteria ESCHERICHIA COLI   Final  URINE CULTURE     Status: None   Collection Time    07/18/14  9:04 PM      Result Value Ref Range Status   Specimen  Description URINE, CLEAN CATCH   Final   Special Requests Normal   Final   Culture  Setup Time     Final   Value: 07/19/2014 00:25     [facility]formed [facility]   Colony Count     Final   Value: 60,000 COLONIES/ML     [facility]formed at [facility]   Culture     Final   Value: YEAST     [facility]formed at [facility]   Report Status 07/20/2014 FINAL   Final    Studies/Results: Ct Abdomen Pelvis W Contrast  07/23/2014   CLINICAL DATA:  Intraabdominal abscess s/p drain placement; ileus subtotal colectomy 07/01/2014. Postoperative abscess adjacent to the pancreas.  EXAM: CT ABDOMEN AND PELVIS WITH CONTRAST  TECHNIQUE: Multidetector CT imaging of the abdomen and pelvis was performed using the standard protocol following bolus administration of intravenous contrast.  CONTRAST:  100mL OMNIPAQUE IOHEXOL 300 MG/ML  SOLN  COMPARISON:  Multiple priors.  Most recent CT 07/16/2014.  FINDINGS: Atelectasis the lung bases.  Pigtail drainage catheter anterior to the pancreas in the abscess. The drainage catheter is unchanged in position compared to prior.  The abscess is best visualized on coronal images and compared to the prior exam from 07/16/2014. When direct comparison is made, the abscess appears slightly smaller in the craniocaudal dimension, now measuring 33 mm, previously 42 mm. On axial images, the abscess has changed configuration slightly. Axial measurement today is 32 mm x 42 mm, previously 37 mm x 34 mm.  There is persistent distention of small bowel compatible with ileus.  IMPRESSION: 1. Unchanged pigtail drainage catheter in abscess anterior to the body of the pancreas. The abscess has slightly decreased in size compared 07/16/2014. 2. Persistent ileus, not changed compared to prior CT.   Electronically Signed   By: Andreas Newport M.D.   On: 07/23/2014 12:26   Ir Sinus/fist Tube Chk-non Gi  07/24/2014   CLINICAL DATA:  48 year old male with a history of diverticulitis complicated  by intra-abdominal abscess ease and prolonged ileus. The currently has a percutaneous 12 French drain in a abscess collection just inferior to the stomach in the region of the foramen of [place]. Clinically, the patient is improving. However on repeat CT imaging there is a persistent collection just superior to the pigtail of the drainage catheter. Drain injection with a possible drain manipulation, exchange or up size under fluoroscopy is warranted.  EXAM: SINUS TRACT INJECTION/FISTULOGRAM; IR CATHETER TUBE CHANGE  Date: 07/24/2014  PROCEDURE: 1. Drain injection under fluoroscopy 2. Manual disruption of loculations 3. Exchange and up size for a 14 [place] cook all-purpose drainage catheter Interventional Radiologist:  Sterling Big, MD  ANESTHESIA/SEDATION: None required  FLUOROSCOPY TIME:  5 minutes 18 seconds  CONTRAST:  20 mL Omnipaque 300  TECHNIQUE: Informed consent was obtained from the patient following explanation of the procedure, risks, benefits and alternatives. The patient understands, agrees and consents for the procedure. All questions were addressed. A time out was performed.  Maximal barrier sterile technique utilized including caps, mask, sterile gowns, sterile gloves, large sterile drape, hand hygiene, and Betadine skin prep.  A gentle hand injection of contrast material was performed through the existing catheter. The catheter was very difficult to inject consistent with at least partial occlusion. The contrast material opacifies a cavity about the locking loop of the catheter. There is a fistulous connection with the adjacent small bowel in the left upper quadrant. The catheter was cut and removed over a Bentson wire. The catheter was inspected and found to be almost completely clogged with thick tenacious debris.  A Kumpe catheter was advanced into the cavity over the Bentson wire. Using a glidewire, the loculations within the abscess collection or disrupted until contrast material is  noted to extend more superior into the undrained portion of the collection. The glidewire was exchanged for a Bentson wire through an angled catheter. A new Cook 14 [place] all-purpose drainage catheter was advanced over the wire and formed within the collection. Approximately 20 mL of thick purulent fluid and debris were aspirated. The catheter was gently flush the connected to JP bulb suction. The catheter was secured to the skin with 0 Prolene suture. The patient tolerated the procedure well.  COMPLICATIONS: None.  IMPRESSION: 1. Nearly completely occluded existing 12 French percutaneous drain 2. Fistulous connection between the residual abscess cavity and the adjacent small bowel in the left upper quadrant. 3. Successful manual disruption of internal loculations resulting in increased communication to the undrained  component of the abscess cavity. 4. Exchange and up-size for a new 14 French percutaneous drainage catheter. The drain was connected to JP bulb suction.  PLAN: 1. Maintained 14 French drain to JP bulb suction and flush 3 times daily. 2. Given the presence of adjacent small bowel fistula, this tube may be in place for a prolonged course. 3. Continue NPO/bowel rest with parenteral nutrition. 4. Recommend tube injection under fluoroscopy in 2-4 weeks in Interventional Radiology. Signed,  Sterling Big, MD  [facility]  [facility]   Electronically Signed   By: Malachy Moan M.D.   On: 07/24/2014 15:49   Ir Catheter Tube Change  07/24/2014   CLINICAL DATA:  48 year old male with a history of diverticulitis complicated by intra-abdominal abscess ease and prolonged ileus. The currently has a percutaneous 12 French drain in a abscess collection just inferior to the stomach in the region of the foramen of [place]low. Clinically, the patient is improving. However on repeat CT imaging there is a persistent collection just superior to the pigtail of the  drainage catheter. Drain injection with a possible drain manipulation, exchange or up size under fluoroscopy is warranted.  EXAM: SINUS TRACT INJECTION/FISTULOGRAM; IR CATHETER TUBE CHANGE  Date: 07/24/2014  PROCEDURE: 1. Drain injection under fluoroscopy 2. Manual disruption of loculations 3. Exchange and up size for a 14 [place] cook all-purpose drainage catheter Interventional Radiologist:  Sterling Big, MD  ANESTHESIA/SEDATION: None required  FLUOROSCOPY TIME:  5 minutes 18 seconds  CONTRAST:  20 mL Omnipaque 300  TECHNIQUE: Informed consent was obtained from the patient following explanation of the procedure, risks, benefits and alternatives. The patient understands, agrees and consents for the procedure. All questions were addressed. A time out was performed.  Maximal barrier sterile technique utilized including caps, mask, sterile gowns, sterile gloves, large sterile drape, hand hygiene, and Betadine skin prep.  A gentle hand injection of contrast material was performed through the existing catheter. The catheter was very difficult to inject consistent with at least partial occlusion. The contrast material opacifies a cavity about the locking loop of the catheter. There is a fistulous connection with the adjacent small bowel in the left upper quadrant. The catheter was cut and removed over a Bentson wire. The catheter was inspected and found to be almost completely clogged with thick tenacious debris.  A Kumpe catheter was advanced into the cavity over the Bentson wire. Using a glidewire, the loculations within the abscess collection or disrupted until contrast material is noted to extend more superior into the undrained portion of the collection. The glidewire was exchanged for a Bentson wire through an angled catheter. A new Cook 14 [place] all-purpose drainage catheter was advanced over the wire and formed within the collection. Approximately 20 mL of thick purulent fluid and debris were aspirated.  The catheter was gently flush the connected to JP bulb suction. The catheter was secured to the skin with 0 Prolene suture. The patient tolerated the procedure well.  COMPLICATIONS: None.  IMPRESSION: 1. Nearly completely occluded existing 12 French percutaneous drain 2. Fistulous connection between the residual abscess cavity and the adjacent small bowel in the left upper quadrant. 3. Successful manual disruption of internal loculations resulting in increased communication to the undrained component of the abscess cavity. 4. Exchange and up-size for a new 14 French percutaneous drainage catheter. The drain was connected to JP bulb suction.  PLAN: 1. Maintained 14 French drain to JP bulb suction and flush 3 times  daily. 2. Given the presence of adjacent small bowel fistula, this tube may be in place for a prolonged course. 3. Continue NPO/bowel rest with parenteral nutrition. 4. Recommend tube injection under fluoroscopy in 2-4 weeks in Interventional Radiology. Signed,  Sterling BigHeath K. McCullough, MD  [facility]  [facility]   Electronically Signed   By: Malachy MoanHeath  McCullough M.D.   On: 07/24/2014 15:49      Assessment/Plan:  Principal Problem:   Diverticulitis of left colon s/p lap assisted subtotal colectomy 07/01/2014 Active Problems:   Sarcoidosis   Hypertension   Postoperative intra-abdominal abscess - ESBL resistent Ecoli   Superficial venous thrombosis of arm   Pancreatitis, acute   Protein-calorie malnutrition, moderate    [name] CampanileDonald S Sproles is a 48 y.o. male with Complicated diverticulitis sp LAP total colectomy, then found to have intraabdominal abscess with ESBL. Dr Abbey Chattersosenbower was worried this last week about patient perhaps having other organisms involved in the abscess besides the ESBL. He had brought the patient out to imipenem to cover sit for Pseudomonas. After thorough discussion we proceeded to add  in MRSA coverage with Zyvox and antifungal  coverage with micafungin.   He has NOW been found to have a FISTULOUS  connection between the residual abscess cavity and the  adjacent small bowel in the left upper quadrant by IR.  THIS WOULD BEST EXPLAIN HIS PERSISTENTLY ELEVATED WBC (which is better today)   #1 Intraabdominal abscess with ESBL and FISTULA with small bowel  He is now on bowel rest and with upsizing of the drain  I am going to dc his ZVYOX since I have low suspicion VRE , MRSA or MRSE   --I will then proceed to dc the echinocandin soon --continue imipenem (will likley narrow to invanz soon)   #2 Candiduria: could have been due to a foley cather (which is now out). Could also be a contaminant     LOS: 23 days   Acey LavCornelius Van Dam 07/24/2014, 6:51 PM

## 2014-07-24 NOTE — Progress Notes (Signed)
I discussed the findings on todays imaging studies with him and his wife.  It is difficult to know when the apparent micro leak from the small intestine occurred. It was either very delayed from the time of surgery, or secondary to erosion of abscess into the small bowel. None of the CT scans have shown extravasation of contrast or anything to suggest a small intestinal leak. He has been clinically improving over the past 5-7 days. The plan now will be to place him at bowel rest with TPN. Antibiotics are being tapered. He may be able to go home on home TPN with the drain in place and on IV Invanz. We'll repeat a drain study in 1-2 weeks.  There is no, nor has there been, any enteric output from the drain.

## 2014-07-24 NOTE — Progress Notes (Signed)
23 Days Post-Op  Subjective: Feeling better overall today.  Eating about 50% of meals.  Passing gas.  Bowels moving.  Walking more.  Objective: Vital signs in last 24 hours: Temp:  [97.9 F (36.6 C)-98.5 F (36.9 C)] 97.9 F (36.6 C) (10/15 0630) Pulse Rate:  [59-74] 72 (10/15 0630) Resp:  [18-20] 18 (10/15 0630) BP: (101-125)/(52-70) 108/60 mmHg (10/15 0630) SpO2:  [98 %-100 %] 98 % (10/15 0630) Last BM Date: 07/23/14  Intake/Output from previous day: 10/14 0701 - 10/15 0700 In: 1311.7 [P.O.:120; I.V.:191.7; IV Piggyback:1000] Out: -  Intake/Output this shift:    PE: General- In NAD Abdomen-soft, not distended, wounds clean and intact, minimal clear drain output  Lab Results:   Recent Labs  07/23/14 0525 07/24/14 0525  WBC 19.1* 14.5*  HGB 9.0* 9.0*  HCT 26.7* 28.0*  PLT 531* 511*   BMET  Recent Labs  07/22/14 0445 07/24/14 0525  NA 134* 136*  K 4.1 3.8  CL 96 98  CO2 24 25  GLUCOSE 135* 86  BUN 17 14  CREATININE 1.01 0.99  CALCIUM 9.1 9.2   PT/INR No results found for this basename: LABPROT, INR,  in the last 72 hours Comprehensive Metabolic Panel:    Component Value Date/Time   NA 136* 07/24/2014 0525   NA 134* 07/22/2014 0445   K 3.8 07/24/2014 0525   K 4.1 07/22/2014 0445   CL 98 07/24/2014 0525   CL 96 07/22/2014 0445   CO2 25 07/24/2014 0525   CO2 24 07/22/2014 0445   BUN 14 07/24/2014 0525   BUN 17 07/22/2014 0445   CREATININE 0.99 07/24/2014 0525   CREATININE 1.01 07/22/2014 0445   GLUCOSE 86 07/24/2014 0525   GLUCOSE 135* 07/22/2014 0445   CALCIUM 9.2 07/24/2014 0525   CALCIUM 9.1 07/22/2014 0445   AST 22 07/24/2014 0525   AST 16 07/21/2014 0334   ALT 24 07/24/2014 0525   ALT 13 07/21/2014 0334   ALKPHOS 78 07/24/2014 0525   ALKPHOS 80 07/21/2014 0334   BILITOT 0.2* 07/24/2014 0525   BILITOT 0.3 07/21/2014 0334   PROT 7.1 07/24/2014 0525   PROT 7.1 07/21/2014 0334   ALBUMIN 2.4* 07/24/2014 0525   ALBUMIN 2.5* 07/21/2014  0334     Studies/Results: Ct Abdomen Pelvis W Contrast  07/23/2014   CLINICAL DATA:  Intraabdominal abscess s/p drain placement; ileus subtotal colectomy 07/01/2014. Postoperative abscess adjacent to the pancreas.  EXAM: CT ABDOMEN AND PELVIS WITH CONTRAST  TECHNIQUE: Multidetector CT imaging of the abdomen and pelvis was performed using the standard protocol following bolus administration of intravenous contrast.  CONTRAST:  100mL OMNIPAQUE IOHEXOL 300 MG/ML  SOLN  COMPARISON:  Multiple priors.  Most recent CT 07/16/2014.  FINDINGS: Atelectasis the lung bases.  Pigtail drainage catheter anterior to the pancreas in the abscess. The drainage catheter is unchanged in position compared to prior.  The abscess is best visualized on coronal images and compared to the prior exam from 07/16/2014. When direct comparison is made, the abscess appears slightly smaller in the craniocaudal dimension, now measuring 33 mm, previously 42 mm. On axial images, the abscess has changed configuration slightly. Axial measurement today is 32 mm x 42 mm, previously 37 mm x 34 mm.  There is persistent distention of small bowel compatible with ileus.  IMPRESSION: 1. Unchanged pigtail drainage catheter in abscess anterior to the body of the pancreas. The abscess has slightly decreased in size compared 07/16/2014. 2. Persistent ileus, not changed compared to prior  CT.   Electronically Signed   By: Andreas NewportGeoffrey  Lamke M.D.   On: 07/23/2014 12:26    Anti-infectives: Anti-infectives   Start     Dose/Rate Route Frequency Ordered Stop   07/20/14 2200  imipenem-cilastatin (PRIMAXIN) 500 mg in sodium chloride 0.9 % 100 mL IVPB     500 mg 200 mL/hr over 30 Minutes Intravenous Every 6 hours 07/20/14 1652     07/18/14 1100  micafungin (MYCAMINE) 150 mg in sodium chloride 0.9 % 100 mL IVPB     150 mg 100 mL/hr over 1 Hours Intravenous Every 24 hours 07/18/14 0907     07/18/14 1000  linezolid (ZYVOX) IVPB 600 mg     600 mg 300 mL/hr over  60 Minutes Intravenous Every 12 hours 07/18/14 0907     07/17/14 2000  imipenem-cilastatin (PRIMAXIN) 500 mg in sodium chloride 0.9 % 100 mL IVPB  Status:  Discontinued     500 mg 200 mL/hr over 30 Minutes Intravenous Every 6 hours 07/17/14 1743 07/20/14 1652   07/14/14 1600  ertapenem (INVANZ) 1 g in sodium chloride 0.9 % 50 mL IVPB  Status:  Discontinued     1 g 100 mL/hr over 30 Minutes Intravenous Every 24 hours 07/14/14 1457 07/17/14 1743   07/14/14 0800  piperacillin-tazobactam (ZOSYN) IVPB 3.375 g  Status:  Discontinued     3.375 g 12.5 mL/hr over 240 Minutes Intravenous Every 8 hours 07/14/14 0723 07/14/14 1456   07/08/14 2100  vancomycin (VANCOCIN) IVPB 1000 mg/200 mL premix  Status:  Discontinued     1,000 mg 200 mL/hr over 60 Minutes Intravenous Every 12 hours 07/08/14 1104 07/10/14 0832   07/08/14 0800  vancomycin (VANCOCIN) 2,000 mg in sodium chloride 0.9 % 500 mL IVPB     2,000 mg 250 mL/hr over 120 Minutes Intravenous  Once 07/08/14 0757 07/08/14 1107   07/07/14 1000  piperacillin-tazobactam (ZOSYN) IVPB 3.375 g  Status:  Discontinued     3.375 g 12.5 mL/hr over 240 Minutes Intravenous Every 8 hours 07/07/14 0821 07/10/14 0832   07/01/14 2000  cefoTEtan (CEFOTAN) 2 g in dextrose 5 % 50 mL IVPB     2 g 100 mL/hr over 30 Minutes Intravenous Every 12 hours 07/01/14 1339 07/01/14 2015   07/01/14 0625  cefoTEtan in Dextrose 5% (CEFOTAN) IVPB 2 g  Status:  Discontinued     2 g Intravenous Every 12 hours 07/01/14 0625 07/01/14 1413      Assessment Principal Problem:  Diverticulitis of left colon s/p lap assisted subtotal colectomy 07/01/2014-has had a prolonged ileus which continues to slowly improve and likely related to the abscess    Postoperative intra-abdominal abscess - ESBL resistent Ecoli; on broad spectrum abxs; abscess slightly smaller on CT but not resolved;  WBC down again today; drain study and possible manipulation or upsizing of drain scheduled for today.  Yeast  UTI-60,000 colonies   Small superficial venous thrombosis of vein in forearm-no palpable cord; 10/13 doppler shows no extension of small superficial vein thrombosis   Protein-calorie malnutrition, moderate-still on TPN as oral intake is limited.    LOS: 23 days   Plan:  Await results of drain study.  Continue TPN and broad spectrum abxs.   Tamisha Nordstrom J 07/24/2014

## 2014-07-25 LAB — GLUCOSE, CAPILLARY
GLUCOSE-CAPILLARY: 129 mg/dL — AB (ref 70–99)
Glucose-Capillary: 92 mg/dL (ref 70–99)
Glucose-Capillary: 90 mg/dL (ref 70–99)
Glucose-Capillary: 81 mg/dL (ref 70–99)
Glucose-Capillary: 115 mg/dL — ABNORMAL HIGH (ref 70–99)
Glucose-Capillary: 89 mg/dL (ref 70–99)

## 2014-07-25 LAB — CBC
HCT: 27.5 % — ABNORMAL LOW (ref 39.0–52.0)
HEMOGLOBIN: 9 g/dL — AB (ref 13.0–17.0)
MCH: 28.1 pg (ref 26.0–34.0)
MCHC: 32.7 g/dL (ref 30.0–36.0)
MCV: 85.9 fL (ref 78.0–100.0)
PLATELETS: 477 10*3/uL — AB (ref 150–400)
RBC: 3.2 MIL/uL — ABNORMAL LOW (ref 4.22–5.81)
RDW: 14.2 % (ref 11.5–15.5)
WBC: 12.8 10*3/uL — ABNORMAL HIGH (ref 4.0–10.5)

## 2014-07-25 MED ORDER — TRACE MINERALS CR-CU-F-FE-I-MN-MO-SE-ZN IV SOLN
INTRAVENOUS | Status: AC
  Administered 2014-07-25: 17:00:00 via INTRAVENOUS
  Filled 2014-07-25: qty 2000

## 2014-07-25 MED ORDER — SODIUM CHLORIDE 0.9 % IV SOLN
1.0000 g | INTRAVENOUS | Status: DC
  Administered 2014-07-26 – 2014-07-27 (×2): 1 g via INTRAVENOUS
  Filled 2014-07-25 (×5): qty 1

## 2014-07-25 MED ORDER — ERTAPENEM SODIUM 1 G IJ SOLR: 1.0000 g | INTRAMUSCULAR | Status: DC

## 2014-07-25 MED ORDER — FAT EMULSION 20 % IV EMUL
250.0000 mL | INTRAVENOUS | Status: AC
  Administered 2014-07-25: 250 mL via INTRAVENOUS
  Filled 2014-07-25: qty 250

## 2014-07-25 NOTE — Progress Notes (Signed)
24 Days Post-Op  Subjective: Slept well.  Sore around drain site.  Objective: Vital signs in last 24 hours: Temp:  [97.8 F (36.6 C)-98.7 F (37.1 C)] 98.3 F (36.8 C) (10/16 0640) Pulse Rate:  [73-81] 77 (10/16 0640) Resp:  [17-18] 17 (10/16 0640) BP: (106-118)/(66-80) 110/67 mmHg (10/16 0640) SpO2:  [100 %] 100 % (10/16 0640) Last BM Date: 07/24/14  Intake/Output from previous day: 10/15 0701 - 10/16 0700 In: 243 [I.V.:120; TPN:120] Out: 13 [Drains:13] Intake/Output this shift: Total I/O In: -  Out: 5 [Drains:5]  PE: General- In NAD Abdomen-soft, not distended, wounds clean and intact, cloudy reddish drain output  Lab Results:   Recent Labs  07/24/14 0525 07/25/14 0600  WBC 14.5* 12.8*  HGB 9.0* 9.0*  HCT 28.0* 27.5*  PLT 511* 477*   BMET  Recent Labs  07/24/14 0525  NA 136*  K 3.8  CL 98  CO2 25  GLUCOSE 86  BUN 14  CREATININE 0.99  CALCIUM 9.2   PT/INR No results found for this basename: LABPROT, INR,  in the last 72 hours Comprehensive Metabolic Panel:    Component Value Date/Time   NA 136* 07/24/2014 0525   NA 134* 07/22/2014 0445   K 3.8 07/24/2014 0525   K 4.1 07/22/2014 0445   CL 98 07/24/2014 0525   CL 96 07/22/2014 0445   CO2 25 07/24/2014 0525   CO2 24 07/22/2014 0445   BUN 14 07/24/2014 0525   BUN 17 07/22/2014 0445   CREATININE 0.99 07/24/2014 0525   CREATININE 1.01 07/22/2014 0445   GLUCOSE 86 07/24/2014 0525   GLUCOSE 135* 07/22/2014 0445   CALCIUM 9.2 07/24/2014 0525   CALCIUM 9.1 07/22/2014 0445   AST 22 07/24/2014 0525   AST 16 07/21/2014 0334   ALT 24 07/24/2014 0525   ALT 13 07/21/2014 0334   ALKPHOS 78 07/24/2014 0525   ALKPHOS 80 07/21/2014 0334   BILITOT 0.2* 07/24/2014 0525   BILITOT 0.3 07/21/2014 0334   PROT 7.1 07/24/2014 0525   PROT 7.1 07/21/2014 0334   ALBUMIN 2.4* 07/24/2014 0525   ALBUMIN 2.5* 07/21/2014 0334     Studies/Results: Ct Abdomen Pelvis W Contrast  07/23/2014   CLINICAL DATA:   Intraabdominal abscess s/p drain placement; ileus subtotal colectomy 07/01/2014. Postoperative abscess adjacent to the pancreas.  EXAM: CT ABDOMEN AND PELVIS WITH CONTRAST  TECHNIQUE: Multidetector CT imaging of the abdomen and pelvis was performed using the standard protocol following bolus administration of intravenous contrast.  CONTRAST:  100mL OMNIPAQUE IOHEXOL 300 MG/ML  SOLN  COMPARISON:  Multiple priors.  Most recent CT 07/16/2014.  FINDINGS: Atelectasis the lung bases.  Pigtail drainage catheter anterior to the pancreas in the abscess. The drainage catheter is unchanged in position compared to prior.  The abscess is best visualized on coronal images and compared to the prior exam from 07/16/2014. When direct comparison is made, the abscess appears slightly smaller in the craniocaudal dimension, now measuring 33 mm, previously 42 mm. On axial images, the abscess has changed configuration slightly. Axial measurement today is 32 mm x 42 mm, previously 37 mm x 34 mm.  There is persistent distention of small bowel compatible with ileus.  IMPRESSION: 1. Unchanged pigtail drainage catheter in abscess anterior to the body of the pancreas. The abscess has slightly decreased in size compared 07/16/2014. 2. Persistent ileus, not changed compared to prior CT.   Electronically Signed   By: Andreas NewportGeoffrey  Lamke M.D.   On: 07/23/2014 12:26  Ir Sinus/fist Tube Chk-non Gi  07/24/2014   CLINICAL DATA:  48 year old male with a history of diverticulitis complicated by intra-abdominal abscess ease and prolonged ileus. The currently has a percutaneous 12 French drain in a abscess collection just inferior to the stomach in the region of the foramen of [place]. Clinically, the patient is improving. However on repeat CT imaging there is a persistent collection just superior to the pigtail of the drainage catheter. Drain injection with a possible drain manipulation, exchange or up size under fluoroscopy is warranted.  EXAM: SINUS  TRACT INJECTION/FISTULOGRAM; IR CATHETER TUBE CHANGE  Date: 07/24/2014  PROCEDURE: 1. Drain injection under fluoroscopy 2. Manual disruption of loculations 3. Exchange and up size for a 14 [place] cook all-purpose drainage catheter Interventional Radiologist:  Sterling Big, MD  ANESTHESIA/SEDATION: None required  FLUOROSCOPY TIME:  5 minutes 18 seconds  CONTRAST:  20 mL Omnipaque 300  TECHNIQUE: Informed consent was obtained from the patient following explanation of the procedure, risks, benefits and alternatives. The patient understands, agrees and consents for the procedure. All questions were addressed. A time out was performed.  Maximal barrier sterile technique utilized including caps, mask, sterile gowns, sterile gloves, large sterile drape, hand hygiene, and Betadine skin prep.  A gentle hand injection of contrast material was performed through the existing catheter. The catheter was very difficult to inject consistent with at least partial occlusion. The contrast material opacifies a cavity about the locking loop of the catheter. There is a fistulous connection with the adjacent small bowel in the left upper quadrant. The catheter was cut and removed over a Bentson wire. The catheter was inspected and found to be almost completely clogged with thick tenacious debris.  A Kumpe catheter was advanced into the cavity over the Bentson wire. Using a glidewire, the loculations within the abscess collection or disrupted until contrast material is noted to extend more superior into the undrained portion of the collection. The glidewire was exchanged for a Bentson wire through an angled catheter. A new Cook 14 [place] all-purpose drainage catheter was advanced over the wire and formed within the collection. Approximately 20 mL of thick purulent fluid and debris were aspirated. The catheter was gently flush the connected to JP bulb suction. The catheter was secured to the skin with 0 Prolene suture. The patient  tolerated the procedure well.  COMPLICATIONS: None.  IMPRESSION: 1. Nearly completely occluded existing 12 French percutaneous drain 2. Fistulous connection between the residual abscess cavity and the adjacent small bowel in the left upper quadrant. 3. Successful manual disruption of internal loculations resulting in increased communication to the undrained component of the abscess cavity. 4. Exchange and up-size for a new 14 French percutaneous drainage catheter. The drain was connected to JP bulb suction.  PLAN: 1. Maintained 14 French drain to JP bulb suction and flush 3 times daily. 2. Given the presence of adjacent small bowel fistula, this tube may be in place for a prolonged course. 3. Continue NPO/bowel rest with parenteral nutrition. 4. Recommend tube injection under fluoroscopy in 2-4 weeks in Interventional Radiology. Signed,  Sterling Big, MD  [facility]  [facility]   Electronically Signed   By: Malachy Moan M.D.   On: 07/24/2014 15:49   Ir Catheter Tube Change  07/24/2014   CLINICAL DATA:  48 year old male with a history of diverticulitis complicated by intra-abdominal abscess ease and prolonged ileus. The currently has a percutaneous 12 French drain in a abscess collection just  inferior to the stomach in the region of the foramen of [place]. Clinically, the patient is improving. However on repeat CT imaging there is a persistent collection just superior to the pigtail of the drainage catheter. Drain injection with a possible drain manipulation, exchange or up size under fluoroscopy is warranted.  EXAM: SINUS TRACT INJECTION/FISTULOGRAM; IR CATHETER TUBE CHANGE  Date: 07/24/2014  PROCEDURE: 1. Drain injection under fluoroscopy 2. Manual disruption of loculations 3. Exchange and up size for a 14 [place] cook all-purpose drainage catheter Interventional Radiologist:  Sterling BigHeath K. McCullough, MD  ANESTHESIA/SEDATION: None required  FLUOROSCOPY  TIME:  5 minutes 18 seconds  CONTRAST:  20 mL Omnipaque 300  TECHNIQUE: Informed consent was obtained from the patient following explanation of the procedure, risks, benefits and alternatives. The patient understands, agrees and consents for the procedure. All questions were addressed. A time out was performed.  Maximal barrier sterile technique utilized including caps, mask, sterile gowns, sterile gloves, large sterile drape, hand hygiene, and Betadine skin prep.  A gentle hand injection of contrast material was performed through the existing catheter. The catheter was very difficult to inject consistent with at least partial occlusion. The contrast material opacifies a cavity about the locking loop of the catheter. There is a fistulous connection with the adjacent small bowel in the left upper quadrant. The catheter was cut and removed over a Bentson wire. The catheter was inspected and found to be almost completely clogged with thick tenacious debris.  A Kumpe catheter was advanced into the cavity over the Bentson wire. Using a glidewire, the loculations within the abscess collection or disrupted until contrast material is noted to extend more superior into the undrained portion of the collection. The glidewire was exchanged for a Bentson wire through an angled catheter. A new Cook 14 [place] all-purpose drainage catheter was advanced over the wire and formed within the collection. Approximately 20 mL of thick purulent fluid and debris were aspirated. The catheter was gently flush the connected to JP bulb suction. The catheter was secured to the skin with 0 Prolene suture. The patient tolerated the procedure well.  COMPLICATIONS: None.  IMPRESSION: 1. Nearly completely occluded existing 12 French percutaneous drain 2. Fistulous connection between the residual abscess cavity and the adjacent small bowel in the left upper quadrant. 3. Successful manual disruption of internal loculations resulting in increased  communication to the undrained component of the abscess cavity. 4. Exchange and up-size for a new 14 French percutaneous drainage catheter. The drain was connected to JP bulb suction.  PLAN: 1. Maintained 14 French drain to JP bulb suction and flush 3 times daily. 2. Given the presence of adjacent small bowel fistula, this tube may be in place for a prolonged course. 3. Continue NPO/bowel rest with parenteral nutrition. 4. Recommend tube injection under fluoroscopy in 2-4 weeks in Interventional Radiology. Signed,  Sterling BigHeath K. McCullough, MD  [facility]  [facility]   Electronically Signed   By: Malachy MoanHeath  McCullough M.D.   On: 07/24/2014 15:49    Anti-infectives: Anti-infectives   Start     Dose/Rate Route Frequency Ordered Stop   07/20/14 2200  imipenem-cilastatin (PRIMAXIN) 500 mg in sodium chloride 0.9 % 100 mL IVPB     500 mg 200 mL/hr over 30 Minutes Intravenous Every 6 hours 07/20/14 1652     07/18/14 1100  micafungin (MYCAMINE) 150 mg in sodium chloride 0.9 % 100 mL IVPB     150 mg 100 mL/hr over 1 Hours  Intravenous Every 24 hours 07/18/14 0907     07/18/14 1000  linezolid (ZYVOX) IVPB 600 mg     600 mg 300 mL/hr over 60 Minutes Intravenous Every 12 hours 07/18/14 0907     07/17/14 2000  imipenem-cilastatin (PRIMAXIN) 500 mg in sodium chloride 0.9 % 100 mL IVPB  Status:  Discontinued     500 mg 200 mL/hr over 30 Minutes Intravenous Every 6 hours 07/17/14 1743 07/20/14 1652   07/14/14 1600  ertapenem (INVANZ) 1 g in sodium chloride 0.9 % 50 mL IVPB  Status:  Discontinued     1 g 100 mL/hr over 30 Minutes Intravenous Every 24 hours 07/14/14 1457 07/17/14 1743   07/14/14 0800  piperacillin-tazobactam (ZOSYN) IVPB 3.375 g  Status:  Discontinued     3.375 g 12.5 mL/hr over 240 Minutes Intravenous Every 8 hours 07/14/14 0723 07/14/14 1456   07/08/14 2100  vancomycin (VANCOCIN) IVPB 1000 mg/200 mL premix  Status:  Discontinued     1,000 mg 200 mL/hr  over 60 Minutes Intravenous Every 12 hours 07/08/14 1104 07/10/14 0832   07/08/14 0800  vancomycin (VANCOCIN) 2,000 mg in sodium chloride 0.9 % 500 mL IVPB     2,000 mg 250 mL/hr over 120 Minutes Intravenous  Once 07/08/14 0757 07/08/14 1107   07/07/14 1000  piperacillin-tazobactam (ZOSYN) IVPB 3.375 g  Status:  Discontinued     3.375 g 12.5 mL/hr over 240 Minutes Intravenous Every 8 hours 07/07/14 0821 07/10/14 0832   07/01/14 2000  cefoTEtan (CEFOTAN) 2 g in dextrose 5 % 50 mL IVPB     2 g 100 mL/hr over 30 Minutes Intravenous Every 12 hours 07/01/14 1339 07/01/14 2015   07/01/14 0625  cefoTEtan in Dextrose 5% (CEFOTAN) IVPB 2 g  Status:  Discontinued     2 g Intravenous Every 12 hours 07/01/14 9562 07/01/14 1413      Assessment Principal Problem:  Diverticulitis of left colon s/p lap assisted subtotal colectomy 07/01/2014-has had a prolonged ileus which has improved and likely was related to the abscess    Postoperative intra-abdominal abscess - ESBL resistent Ecoli; on broad spectrum abxs; drain upsized yesterday and 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  He has been clinically improving overall the past 4 days.    LOS: 24 days   Plan:  TPN and sips of liquids.  Antibiotics per ID. Monitor WBC.   Tryphena Perkovich J 07/25/2014

## 2014-07-25 NOTE — Progress Notes (Signed)
PARENTERAL NUTRITION CONSULT NOTE  Pharmacy Consult for TPN Indication: Prolonged postoperative ileus  Allergies  Allergen Reactions  . Celebrex [Celecoxib] Other (See Comments)    "Mom is allergic, so I do not take it"  . Sulfa Antibiotics Nausea And Vomiting  . Codeine Rash  . Dilaudid [Hydromorphone Hcl] Nausea And Vomiting and Anxiety   Patient Measurements: Height: 6' (182.9 cm) Weight: 227 lb 15.3 oz (103.4 kg) IBW/kg (Calculated) : 77.6  Vital Signs: Temp: 98.3 F (36.8 C) (10/16 0640) Temp Source: Oral (10/16 0640) BP: 110/67 mmHg (10/16 0640) Pulse Rate: 77 (10/16 0640) Intake/Output from previous day: 10/15 0701 - 10/16 0700 In: 243 [I.V.:120; TPN:120] Out: 13 [Drains:13] Intake/Output from this shift: Total I/O In: -  Out: 5 [Drains:5] Labs:  Recent Labs  07/23/14 0525 07/24/14 0525 07/25/14 0600  WBC 19.1* 14.5* 12.8*  HGB 9.0* 9.0* 9.0*  HCT 26.7* 28.0* 27.5*  PLT 531* 511* 477*    Recent Labs  07/24/14 0525  NA 136*  K 3.8  CL 98  CO2 25  GLUCOSE 86  BUN 14  CREATININE 0.99  CALCIUM 9.2  MG 2.4  PHOS 4.4  PROT 7.1  ALBUMIN 2.4*  AST 22  ALT 24  ALKPHOS 78  BILITOT 0.2*   Estimated Creatinine Clearance: 115.9 ml/min (by C-G formula based on Cr of 0.99).    Recent Labs  07/24/14 2104 07/25/14 0107 07/25/14 0433  GLUCAP 91 92 90   Insulin Requirements in past 24 hrs: sensitive SSI achs: 0 units   Current Nutrition: Thin liquids (10/11), advance to pureed (10/12) + Ensure Complete TID NPO this am for drain study  IVF: NS at 10 ml/hr  Central access: PICC placed 9/29 TPN start date: 9/29  ASSESSMENT                                                                                                       HPI:  [id number] yoM with recurrent sigmoid diverticulitis. S/p lap assisted subtotal colectomy on 9/22 with subsequent postop ileus. Initially, TPN was started 9/27 and stopped 10/7 when he was tolerating an enteral diet.  Pharmacy  was consulted to resume TPN on 10/9 following recurrent ileus on 10/8.    Significant events:  9/27: Start TPN 10/2: Passing flatus, multiple loose BMs.  CT w/ fluid collection. 10/6: tolerating FLD, advancing to soft diet. TPN wean 10/7: TPN stopped 10/9: resume TPN for recurrent ileus, NGT to suction 10/11: NG clamping trial, starting thin liquids - 480cc intake charted 10/12: Diet advanced to pureed - 25% dinner charted. Repeat UE doppler to evaluate for clot extension; may need to remove PICC. 10/13: UE doppler completed - no propagation noted. Calorie count in progress. Tolerating some soft foods and one Ensure. Per CCS, begin weaning TNA slowly over next 48 hours. 10/14: Repeating abdominal CT. 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 NPO bowel rest ordered  Today 10/16:  Glucose - at goal of < 150   Electrolytes - Na  slightly low, all others WNL   Renal - SCr improving daily  LFTs - WNL   TGs - WNL (9/30, 10/5, 10/8, 10/9, 10/12)  Prealbumin - 13.9 (9/30),  14.4 (10/5), 14.7 (10/9), 13.5 (10/12)  NUTRITIONAL GOALS                                                                                    RD recs: 1950-2250 Kcal, 120-140g Protein, 1.9-2.2 L/day fluid Clinimix 5/15 at a goal rate of 16100ml/hr + 20% fat emulsion at ml/hr to provide: 120 g/day protein, 2184 Kcal/day.  PLAN                                                                                                                  Increase Clinimix E 5/15 at 70 ml/hr, consider increasing to goal tomorrow  Increase 20% fat emulsion at 10 ml/hr.  Standard multivitamins and trace elements added to TPN  Continue CBGs and sensitive scale ACHS  IVF per MD orders  TNA lab panels on Mondays & Thursdays  CMP, mag and phos tomorrow.  Arley Phenixllen Hoyte Ziebell RPh 07/25/2014, 10:37 AM Pager [phone]

## 2014-07-25 NOTE — Progress Notes (Signed)
Referring Physician(s): CCS  Subjective: Patient c/o tenderness at drain site after yesterday's procedure, denies N/V.   Allergies: Celebrex; Sulfa antibiotics; Codeine; and Dilaudid  Medications: Prior to Admission medications   Medication Sig Start Date End Date Taking? Authorizing Provider  diphenhydrAMINE (BENADRYL) 25 MG tablet Take 25 mg by mouth every 6 (six) hours as needed for allergies.    Yes Historical Provider, MD  ondansetron (ZOFRAN-ODT) 4 MG disintegrating tablet Take 4 mg by mouth every 4 (four) hours as needed for nausea or vomiting.   Yes Historical Provider, MD  zolpidem (AMBIEN) 10 MG tablet Take 10 mg by mouth at bedtime as needed for sleep.    Yes Historical Provider, MD  colchicine 0.6 MG tablet Take 0.6 mg by mouth 2 (two) times daily as needed (For gout.).  06/04/14   Historical Provider, MD  losartan-hydrochlorothiazide (HYZAAR) 50-12.5 MG per tablet Take 0.5 tablets by mouth at bedtime.  11/18/13   Historical Provider, MD  Probiotic Product (PROBIOTIC DAILY PO) Take 1 capsule by mouth at bedtime.    Historical Provider, MD    Review of Systems  Vital Signs: BP 110/67  Pulse 77  Temp(Src) 98.3 F (36.8 C) (Oral)  Resp 17  Ht 6' (1.829 m)  Wt 227 lb 15.3 oz (103.4 kg)  BMI 30.91 kg/m2  SpO2 100%  Physical Exam General: A&Ox3, ambulating the halls Abd: Soft, ND, left RP drain intact, tenderness, with 15 cc brown/reddish thick output in bulb, 5cc last 24 hours recorded and 20 ml removed 10/15 in IR.   Imaging: Ct Abdomen Pelvis W Contrast  07/23/2014   CLINICAL DATA:  Intraabdominal abscess s/p drain placement; ileus subtotal colectomy 07/01/2014. Postoperative abscess adjacent to the pancreas.  EXAM: CT ABDOMEN AND PELVIS WITH CONTRAST  TECHNIQUE: Multidetector CT imaging of the abdomen and pelvis was performed using the standard protocol following bolus administration of intravenous contrast.  CONTRAST:  OMNIPAQUE IOHEXOL 300 MG/ML  SOLN   COMPARISON:  Multiple priors.  Most recent CT 07/16/2014.  FINDINGS: Atelectasis the lung bases.  Pigtail drainage catheter anterior to the pancreas in the abscess. The drainage catheter is unchanged in position compared to prior.  The abscess is best visualized on coronal images and compared to the prior exam from 07/16/2014. When direct comparison is made, the abscess appears slightly smaller in the craniocaudal dimension, now measuring 33 mm, previously 42 mm. On axial images, the abscess has changed configuration slightly. Axial measurement today is 32 mm x 42 mm, previously 37 mm x 34 mm.  There is persistent distention of small bowel compatible with ileus.  IMPRESSION: 1. Unchanged pigtail drainage catheter in abscess anterior to the body of the pancreas. The abscess has slightly decreased in size compared 07/16/2014. 2. Persistent ileus, not changed compared to prior CT.   Electronically Signed   By: Andreas Newport M.D.   On: 07/23/2014 12:26   Ir Sinus/fist Tube Chk-non Gi  07/24/2014   CLINICAL DATA:  48 year old male with a history of diverticulitis complicated by intra-abdominal abscess ease and prolonged ileus. The currently has a percutaneous 12 French drain in a abscess collection just inferior to the stomach in the region of the foramen of [place]. Clinically, the patient is improving. However on repeat CT imaging there is a persistent collection just superior to the pigtail of the drainage catheter. Drain injection with a possible drain manipulation, exchange or up size under fluoroscopy is warranted.  EXAM: SINUS TRACT INJECTION/FISTULOGRAM; IR CATHETER TUBE CHANGE  Date: 07/24/2014  PROCEDURE: 1. Drain injection under fluoroscopy 2. Manual disruption of loculations 3. Exchange and up size for a 14 [place] cook all-purpose drainage catheter Interventional Radiologist:  Sterling BigHeath K. McCullough, MD  ANESTHESIA/SEDATION: None required  FLUOROSCOPY TIME:  5 minutes 18 seconds  CONTRAST:  20 mL Omnipaque  300  TECHNIQUE: Informed consent was obtained from the patient following explanation of the procedure, risks, benefits and alternatives. The patient understands, agrees and consents for the procedure. All questions were addressed. A time out was performed.  Maximal barrier sterile technique utilized including caps, mask, sterile gowns, sterile gloves, large sterile drape, hand hygiene, and Betadine skin prep.  A gentle hand injection of contrast material was performed through the existing catheter. The catheter was very difficult to inject consistent with at least partial occlusion. The contrast material opacifies a cavity about the locking loop of the catheter. There is a fistulous connection with the adjacent small bowel in the left upper quadrant. The catheter was cut and removed over a Bentson wire. The catheter was inspected and found to be almost completely clogged with thick tenacious debris.  A Kumpe catheter was advanced into the cavity over the Bentson wire. Using a glidewire, the loculations within the abscess collection or disrupted until contrast material is noted to extend more superior into the undrained portion of the collection. The glidewire was exchanged for a Bentson wire through an angled catheter. A new Cook 14 [place] all-purpose drainage catheter was advanced over the wire and formed within the collection. Approximately 20 mL of thick purulent fluid and debris were aspirated. The catheter was gently flush the connected to JP bulb suction. The catheter was secured to the skin with 0 Prolene suture. The patient tolerated the procedure well.  COMPLICATIONS: None.  IMPRESSION: 1. Nearly completely occluded existing 12 French percutaneous drain 2. Fistulous connection between the residual abscess cavity and the adjacent small bowel in the left upper quadrant. 3. Successful manual disruption of internal loculations resulting in increased communication to the undrained component of the abscess  cavity. 4. Exchange and up-size for a new 14 French percutaneous drainage catheter. The drain was connected to JP bulb suction.  PLAN: 1. Maintained 14 French drain to JP bulb suction and flush 3 times daily. 2. Given the presence of adjacent small bowel fistula, this tube may be in place for a prolonged course. 3. Continue NPO/bowel rest with parenteral nutrition. 4. Recommend tube injection under fluoroscopy in 2-4 weeks in Interventional Radiology. Signed,  Sterling BigHeath K. McCullough, MD  [facility]  [facility]   Electronically Signed   By: Malachy MoanHeath  McCullough M.D.   On: 07/24/2014 15:49   Ir Catheter Tube Change  07/24/2014   CLINICAL DATA:  48 year old male with a history of diverticulitis complicated by intra-abdominal abscess ease and prolonged ileus. The currently has a percutaneous 12 French drain in a abscess collection just inferior to the stomach in the region of the foramen of [place]. Clinically, the patient is improving. However on repeat CT imaging there is a persistent collection just superior to the pigtail of the drainage catheter. Drain injection with a possible drain manipulation, exchange or up size under fluoroscopy is warranted.  EXAM: SINUS TRACT INJECTION/FISTULOGRAM; IR CATHETER TUBE CHANGE  Date: 07/24/2014  PROCEDURE: 1. Drain injection under fluoroscopy 2. Manual disruption of loculations 3. Exchange and up size for a 14 [place] cook all-purpose drainage catheter Interventional Radiologist:  Sterling BigHeath K. McCullough, MD  ANESTHESIA/SEDATION: None required  FLUOROSCOPY  TIME:  5 minutes 18 seconds  CONTRAST:  20 mL Omnipaque 300  TECHNIQUE: Informed consent was obtained from the patient following explanation of the procedure, risks, benefits and alternatives. The patient understands, agrees and consents for the procedure. All questions were addressed. A time out was performed.  Maximal barrier sterile technique utilized including caps, mask, sterile  gowns, sterile gloves, large sterile drape, hand hygiene, and Betadine skin prep.  A gentle hand injection of contrast material was performed through the existing catheter. The catheter was very difficult to inject consistent with at least partial occlusion. The contrast material opacifies a cavity about the locking loop of the catheter. There is a fistulous connection with the adjacent small bowel in the left upper quadrant. The catheter was cut and removed over a Bentson wire. The catheter was inspected and found to be almost completely clogged with thick tenacious debris.  A Kumpe catheter was advanced into the cavity over the Bentson wire. Using a glidewire, the loculations within the abscess collection or disrupted until contrast material is noted to extend more superior into the undrained portion of the collection. The glidewire was exchanged for a Bentson wire through an angled catheter. A new Cook 14 [place] all-purpose drainage catheter was advanced over the wire and formed within the collection. Approximately 20 mL of thick purulent fluid and debris were aspirated. The catheter was gently flush the connected to JP bulb suction. The catheter was secured to the skin with 0 Prolene suture. The patient tolerated the procedure well.  COMPLICATIONS: None.  IMPRESSION: 1. Nearly completely occluded existing 12 French percutaneous drain 2. Fistulous connection between the residual abscess cavity and the adjacent small bowel in the left upper quadrant. 3. Successful manual disruption of internal loculations resulting in increased communication to the undrained component of the abscess cavity. 4. Exchange and up-size for a new 14 French percutaneous drainage catheter. The drain was connected to JP bulb suction.  PLAN: 1. Maintained 14 French drain to JP bulb suction and flush 3 times daily. 2. Given the presence of adjacent small bowel fistula, this tube may be in place for a prolonged course. 3. Continue NPO/bowel  rest with parenteral nutrition. 4. Recommend tube injection under fluoroscopy in 2-4 weeks in Interventional Radiology. Signed,  Sterling BigHeath K. McCullough, MD  [facility]  [facility]   Electronically Signed   By: Malachy MoanHeath  McCullough M.D.   On: 07/24/2014 15:49    Labs:  CBC:  Recent Labs  07/22/14 0445 07/23/14 0525 07/24/14 0525 07/25/14 0600  WBC 23.9* 19.1* 14.5* 12.8*  HGB 9.1* 9.0* 9.0* 9.0*  HCT 27.9* 26.7* 28.0* 27.5*  PLT 609* 531* 511* 477*    COAGS:  Recent Labs  06/25/14 1445 07/12/14 0508  INR 1.08 1.19  APTT  --  34    BMP:  Recent Labs  07/20/14 0421 07/21/14 0334 07/22/14 0445 07/24/14 0525  NA 138 132* 134* 136*  K 4.0 4.0 4.1 3.8  CL 102 96 96 98  CO2 24 25 24 25   GLUCOSE 124* 132* 135* 86  BUN 14 15 17 14   CALCIUM 8.8 9.1 9.1 9.2  CREATININE 1.06 1.02 1.01 0.99  GFRNONAA 82* 86* 87* >90  GFRAA >90 >90 >90 >90    LIVER FUNCTION TESTS:  Recent Labs  07/18/14 0430 07/19/14 0420 07/21/14 0334 07/24/14 0525  BILITOT 0.5 0.3 0.3 0.2*  AST 17 12 16 22   ALT 24 16 13 24   ALKPHOS 101 89 80 78  PROT 7.8 6.9 7.1 7.1  ALBUMIN 2.7* 2.5* 2.5* 2.4*    Assessment and Plan: Recurrent sigmoid diverticulitis s/p subtotal colectomy  Post operative left RP abscess s/p drainage 10/3, switched to JP suction 10/5 Cx E. Coli ESBL, repeat Cx 10/9 E. Coli ESBL, ID on board  CT 10/7 shows decreased size of drained abscess, no additional abscesses  CT 10/14 shows unchanged abscess superior to drain, minimal to no output from drain S/p upsize to 52f and repositioned drain 10/15, confirmed (+) small bowel fistula, now NPO, recommend repeat drain injection in IR 2-4 weeks.  Flush drain TID and monitor daily output.  Leukocytosis, trending down today, afebrile.  Plans per ID/CCS     Signed: Berneta Levins 07/25/2014, 11:44 AM

## 2014-07-25 NOTE — Progress Notes (Addendum)
[facility]  Day # 8  imipenem Day # 8 zyvox Day # 8  micafungin  4 day of Invanz  Subjective: Feels better   Antibiotics:  Anti-infectives   Start     Dose/Rate Route Frequency Ordered Stop   07/25/14 1700  ertapenem [facility]) injection 1 g     1 g Intramuscular Every 24 hours 07/25/14 1655     07/20/14 2200  imipenem-cilastatin (PRIMAXIN) 500 mg in sodium chloride 0.9 % 100 mL IVPB  Status:  Discontinued     500 mg 200 mL/hr over 30 Minutes Intravenous Every 6 hours 07/20/14 1652 07/25/14 1655   07/18/14 1100  micafungin (MYCAMINE) 150 mg in sodium chloride 0.9 % 100 mL IVPB  Status:  Discontinued     150 mg 100 mL/hr over 1 Hours Intravenous Every 24 hours 07/18/14 0907 07/25/14 1655   07/18/14 1000  linezolid (ZYVOX) IVPB 600 mg  Status:  Discontinued     600 mg 300 mL/hr over 60 Minutes Intravenous Every 12 hours 07/18/14 0907 07/25/14 1655   07/17/14 2000  imipenem-cilastatin (PRIMAXIN) 500 mg in sodium chloride 0.9 % 100 mL IVPB  Status:  Discontinued     500 mg 200 mL/hr over 30 Minutes Intravenous Every 6 hours 07/17/14 1743 07/20/14 1652   07/14/14 1600  ertapenem (INVANZ) 1 g in sodium chloride 0.9 % 50 mL IVPB  Status:  Discontinued     1 g 100 mL/hr over 30 Minutes Intravenous Every 24 hours 07/14/14 1457 07/17/14 1743   07/14/14 0800  piperacillin-tazobactam (ZOSYN) IVPB 3.375 g  Status:  Discontinued     3.375 g 12.5 mL/hr over 240 Minutes Intravenous Every 8 hours 07/14/14 0723 07/14/14 1456   07/08/14 2100  vancomycin (VANCOCIN) IVPB 1000 mg/200 mL premix  Status:  Discontinued     1,000 mg 200 mL/hr over 60 Minutes Intravenous Every 12 hours 07/08/14 1104 07/10/14 0832   07/08/14 0800  vancomycin (VANCOCIN) 2,000 mg in sodium chloride 0.9 % 500 mL IVPB     2,000 mg 250 mL/hr over 120 Minutes Intravenous  Once 07/08/14 0757 07/08/14 1107   07/07/14 1000  piperacillin-tazobactam (ZOSYN) IVPB 3.375 g  Status:  Discontinued     3.375 g 12.5 mL/hr over 240 Minutes Intravenous Every 8 hours 07/07/14 0821 07/10/14 0832   07/01/14 2000  cefoTEtan (CEFOTAN) 2 g in dextrose 5 % 50 mL IVPB     2 g 100 mL/hr over 30 Minutes Intravenous Every 12 hours 07/01/14 1339 07/01/14 2015   07/01/14 0625  cefoTEtan in Dextrose 5% (CEFOTAN) IVPB 2 g  Status:  Discontinued     2 g Intravenous Every 12 hours 07/01/14 0625 07/01/14 1413      Medications: Scheduled Meds: . acetaminophen  1,000 mg Oral TID  . bismuth subsalicylate  30 mL Oral BID AC  . enoxaparin (LOVENOX) injection  50 mg Subcutaneous Q24H  . ertapenem  1 g Intramuscular Q24H  . losartan  25 mg Oral QHS   And  . hydrochlorothiazide  6.25 mg Oral QHS  . insulin aspart  0-5 Units Subcutaneous QHS  . insulin aspart  0-9 Units Subcutaneous TID WC  . lip balm  1 application Topical BID  . psyllium  1 packet Oral Daily  . saccharomyces boulardii  250 mg Oral BID   Continuous Infusions: . sodium chloride 10 mL/hr at 07/23/14 0625  . Marland KitchenTPN (CLINIMIX-E) Adult 40 mL/hr at 07/24/14 1744   And  .  fat emulsion 250 mL (07/24/14 1744)  . Marland KitchenTPN (CLINIMIX-E) Adult     And  . fat emulsion     PRN Meds:.alum & mag hydroxide-simeth, colchicine, diphenhydrAMINE, diphenhydrAMINE, LORazepam, magic mouthwash, menthol-cetylpyridinium, metoprolol, morphine injection, ondansetron (ZOFRAN) IV, ondansetron, ondansetron, phenol, promethazine, sodium chloride, zolpidem    Objective: Weight change:   Intake/Output Summary (Last 24 hours) at 07/25/14 1655 Last data filed at 07/25/14 1400  Gross per 24 hour  Intake   2503 ml  Output      8 ml  Net   2495 ml   Blood pressure 110/72, pulse 69, temperature 97.8 F (36.6 C), temperature source Oral, resp. rate 18, height 6' (1.829 m), weight 227 lb 15.3 oz (103.4 kg), SpO2 100.00%. Temp:  [97.8 F (36.6 C)-98.7 F (37.1 C)] 97.8 F (36.6 C) (10/16 1400) Pulse Rate:  [69-81] 69 (10/16 1400) Resp:  [17-18] 18 (10/16 1400) BP:  (106-110)/(66-72) 110/72 mmHg (10/16 1400) SpO2:  [100 %] 100 % (10/16 1400)  Physical Exam: General: patient was napping, discussed CT findings with him Neuro: nonfocal, strength and sensation intact   CBC:  CBC Latest Ref Rng 07/25/2014 07/24/2014 07/23/2014  WBC 4.0 - 10.5 K/uL 12.8(H) 14.5(H) 19.1(H)  Hemoglobin 13.0 - 17.0 g/dL 9.0(L) 9.0(L) 9.0(L)  Hematocrit 39.0 - 52.0 % 27.5(L) 28.0(L) 26.7(L)  Platelets 150 - 400 K/uL 477(H) 511(H) 531(H)      BMET  Recent Labs  07/24/14 0525  NA 136*  K 3.8  CL 98  CO2 25  GLUCOSE 86  BUN 14  CREATININE 0.99  CALCIUM 9.2     Liver Panel   Recent Labs  07/24/14 0525  PROT 7.1  ALBUMIN 2.4*  AST 22  ALT 24  ALKPHOS 78  BILITOT 0.2*       Sedimentation Rate No results found for this basename: ESRSEDRATE,  in the last 72 hours C-Reactive Protein No results found for this basename: CRP,  in the last 72 hours  Micro Results: Recent Results (from the past 720 hour(s))  CLOSTRIDIUM DIFFICILE BY PCR     Status: None   Collection Time    07/11/14  9:45 AM      Result Value Ref Range Status   C difficile by pcr NEGATIVE  NEGATIVE Final   Comment: Performed at [facility]  CULTURE, ROUTINE-ABSCESS     Status: None   Collection Time    07/12/14  8:53 AM      Result Value Ref Range Status   Specimen Description ABSCESS PERITONEAL   Final   Special Requests Normal   Final   Gram Stain     Final   Value: MODERATE WBC PRESENT, PREDOMINANTLY MONONUCLEAR     FEW WBC PRESENT, PREDOMINANTLY PMN     NO     SQUAMOUS EPITHELIAL CELLS PRESENT     MODERATE GRAM NEGATIVE RODS   Culture     Final   Value: ABUNDANT ESCHERICHIA COLI     Note: Confirmed Extended Spectrum Beta-Lactamase Producer (ESBL) CRITICAL RESULT CALLED TO, READ BACK BY AND VERIFIED WITH: KATHERINE STRUPE 07/14/14 1115 BY SMITHERSJ     Performed at [facility]   Report Status 07/14/2014 FINAL   Final   Organism ID, Bacteria ESCHERICHIA  COLI   Final  CLOSTRIDIUM DIFFICILE BY PCR     Status: None   Collection Time    07/17/14  3:32 PM      Result Value Ref Range Status   C difficile by  pcr NEGATIVE  NEGATIVE Final   Comment: [facility]  CULTURE, BLOOD (ROUTINE X 2)     Status: None   Collection Time    07/18/14  8:59 AM      Result Value Ref Range Status   Specimen Description BLOOD LEFT ARM   Final   Special Requests BOTTLES DRAWN AEROBIC AND ANAEROBIC 10CC EA   Final   Culture  Setup Time     Final   Value: 07/18/2014 12:19     [facility]   Culture     Final   Value: NO GROWTH 5 DAYS     [facility]   Report Status 07/24/2014 FINAL   Final  CULTURE, BLOOD (ROUTINE X 2)     Status: None   Collection Time    07/18/14  9:03 AM      Result Value Ref Range Status   Specimen Description BLOOD LEFT ARM   Final   Special Requests BOTTLES DRAWN AEROBIC AND ANAEROBIC 10CC EA   Final   Culture  Setup Time     Final   Value: 07/18/2014 12:19     [facility] at [facility]   Culture     Final   Value: NO GROWTH 5 DAYS     [facility] at [facility]   Report Status 07/24/2014 FINAL   Final  CULTURE, ROUTINE-ABSCESS     Status: None   Collection Time    07/18/14 11:03 AM      Result Value Ref Range Status   Specimen Description ABDOMEN JPD   Final   Special Requests NONE   Final   Gram Stain     Final   Value: RARE WBC PRESENT, PREDOMINANTLY PMN     NO SQUAMOUS EPITHELIAL CELLS SEEN     MODERATE GRAM NEGATIVE RODS     [facility]formed at [facility]   Culture     Final   Value: ABUNDANT ESCHERICHIA COLI     Note: Confirmed Extended Spectrum Beta-Lactamase Producer (ESBL) CRITICAL RESULT CALLED TO, READ BACK BY AND VERIFIED WITH: PAULA ARMSTRONG @ 0900 ON [id number] BY [facility]     [facility]formed at [facility]   Report Status 07/20/2014 FINAL   Final   Organism ID, Bacteria ESCHERICHIA COLI   Final  URINE CULTURE     Status: None    Collection Time    07/18/14  9:04 PM      Result Value Ref Range Status   Specimen Description URINE, CLEAN CATCH   Final   Special Requests Normal   Final   Culture  Setup Time     Final   Value: 07/19/2014 00:25     [facility]formed [facility]   Colony Count     Final   Value: 60,000 COLONIES/ML     [facility]formed at [facility]   Culture     Final   Value: YEAST     [facility]formed at [facility]   Report Status 07/20/2014 FINAL   Final    Studies/Results: Ir Sinus/fist Tube Chk-non Gi  07/24/2014   CLINICAL DATA:  48 year old male with a history of diverticulitis complicated by intra-abdominal abscess ease and prolonged ileus. The currently has a percutaneous 12 French drain in a abscess collection just inferior to the stomach in the region of the foramen of [place]. Clinically, the patient is improving. However on repeat CT imaging there is a persistent  collection just superior to the pigtail of the drainage catheter. Drain injection with a possible drain manipulation, exchange or up size under fluoroscopy is warranted.  EXAM: SINUS TRACT INJECTION/FISTULOGRAM; IR CATHETER TUBE CHANGE  Date: 07/24/2014  PROCEDURE: 1. Drain injection under fluoroscopy 2. Manual disruption of loculations 3. Exchange and up size for a 14 [place] cook all-purpose drainage catheter Interventional Radiologist:  Sterling BigHeath K. McCullough, MD  ANESTHESIA/SEDATION: None required  FLUOROSCOPY TIME:  5 minutes 18 seconds  CONTRAST:  20 mL Omnipaque 300  TECHNIQUE: Informed consent was obtained from the patient following explanation of the procedure, risks, benefits and alternatives. The patient understands, agrees and consents for the procedure. All questions were addressed. A time out was performed.  Maximal barrier sterile technique utilized including caps, mask, sterile gowns, sterile gloves, large sterile drape, hand hygiene, and Betadine skin prep.  A gentle hand injection of contrast material was performed  through the existing catheter. The catheter was very difficult to inject consistent with at least partial occlusion. The contrast material opacifies a cavity about the locking loop of the catheter. There is a fistulous connection with the adjacent small bowel in the left upper quadrant. The catheter was cut and removed over a Bentson wire. The catheter was inspected and found to be almost completely clogged with thick tenacious debris.  A Kumpe catheter was advanced into the cavity over the Bentson wire. Using a glidewire, the loculations within the abscess collection or disrupted until contrast material is noted to extend more superior into the undrained portion of the collection. The glidewire was exchanged for a Bentson wire through an angled catheter. A new Cook 14 [place]French all-purpose drainage catheter was advanced over the wire and formed within the collection. Approximately 20 mL of thick purulent fluid and debris were aspirated. The catheter was gently flush the connected to JP bulb suction. The catheter was secured to the skin with 0 Prolene suture. The patient tolerated the procedure well.  COMPLICATIONS: None.  IMPRESSION: 1. Nearly completely occluded existing 12 French percutaneous drain 2. Fistulous connection between the residual abscess cavity and the adjacent small bowel in the left upper quadrant. 3. Successful manual disruption of internal loculations resulting in increased communication to the undrained component of the abscess cavity. 4. Exchange and up-size for a new 14 French percutaneous drainage catheter. The drain was connected to JP bulb suction.  PLAN: 1. Maintained 14 French drain to JP bulb suction and flush 3 times daily. 2. Given the presence of adjacent small bowel fistula, this tube may be in place for a prolonged course. 3. Continue NPO/bowel rest with parenteral nutrition. 4. Recommend tube injection under fluoroscopy in 2-4 weeks in Interventional Radiology. Signed,  Sterling BigHeath K.  McCullough, MD  [facility]  [facility]   Electronically Signed   By: Malachy MoanHeath  McCullough M.D.   On: 07/24/2014 15:49   Ir Catheter Tube Change  07/24/2014   CLINICAL DATA:  48 year old male with a history of diverticulitis complicated by intra-abdominal abscess ease and prolonged ileus. The currently has a percutaneous 12 French drain in a abscess collection just inferior to the stomach in the region of the foramen of [place]. Clinically, the patient is improving. However on repeat CT imaging there is a persistent collection just superior to the pigtail of the drainage catheter. Drain injection with a possible drain manipulation, exchange or up size under fluoroscopy is warranted.  EXAM: SINUS TRACT INJECTION/FISTULOGRAM; IR CATHETER TUBE CHANGE  Date: 07/24/2014  PROCEDURE: 1.  Drain injection under fluoroscopy 2. Manual disruption of loculations 3. Exchange and up size for a 14 [place] cook all-purpose drainage catheter Interventional Radiologist:  Sterling Big, MD  ANESTHESIA/SEDATION: None required  FLUOROSCOPY TIME:  5 minutes 18 seconds  CONTRAST:  20 mL Omnipaque 300  TECHNIQUE: Informed consent was obtained from the patient following explanation of the procedure, risks, benefits and alternatives. The patient understands, agrees and consents for the procedure. All questions were addressed. A time out was performed.  Maximal barrier sterile technique utilized including caps, mask, sterile gowns, sterile gloves, large sterile drape, hand hygiene, and Betadine skin prep.  A gentle hand injection of contrast material was performed through the existing catheter. The catheter was very difficult to inject consistent with at least partial occlusion. The contrast material opacifies a cavity about the locking loop of the catheter. There is a fistulous connection with the adjacent small bowel in the left upper quadrant. The catheter was cut and removed over a  Bentson wire. The catheter was inspected and found to be almost completely clogged with thick tenacious debris.  A Kumpe catheter was advanced into the cavity over the Bentson wire. Using a glidewire, the loculations within the abscess collection or disrupted until contrast material is noted to extend more superior into the undrained portion of the collection. The glidewire was exchanged for a Bentson wire through an angled catheter. A new Cook 14 [place] all-purpose drainage catheter was advanced over the wire and formed within the collection. Approximately 20 mL of thick purulent fluid and debris were aspirated. The catheter was gently flush the connected to JP bulb suction. The catheter was secured to the skin with 0 Prolene suture. The patient tolerated the procedure well.  COMPLICATIONS: None.  IMPRESSION: 1. Nearly completely occluded existing 12 French percutaneous drain 2. Fistulous connection between the residual abscess cavity and the adjacent small bowel in the left upper quadrant. 3. Successful manual disruption of internal loculations resulting in increased communication to the undrained component of the abscess cavity. 4. Exchange and up-size for a new 14 French percutaneous drainage catheter. The drain was connected to JP bulb suction.  PLAN: 1. Maintained 14 French drain to JP bulb suction and flush 3 times daily. 2. Given the presence of adjacent small bowel fistula, this tube may be in place for a prolonged course. 3. Continue NPO/bowel rest with parenteral nutrition. 4. Recommend tube injection under fluoroscopy in 2-4 weeks in Interventional Radiology. Signed,  Sterling Big, MD  [facility]  [facility]   Electronically Signed   By: Malachy Moan M.D.   On: 07/24/2014 15:49      Assessment/Plan:  Principal Problem:   Diverticulitis of left colon s/p lap assisted subtotal colectomy 07/01/2014 Active Problems:   Sarcoidosis    Hypertension   Postoperative intra-abdominal abscess - ESBL resistent Ecoli   Superficial venous thrombosis of arm   Pancreatitis, acute   Protein-calorie malnutrition, moderate    OMA ALPERT is a 48 y.o. male with Complicated diverticulitis sp LAP total colectomy, then found to have intraabdominal abscess with ESBL. Dr Abbey Chatters was worried this last week about patient perhaps having other organisms involved in the abscess besides the ESBL. He had brought the patient out to imipenem to cover sit for Pseudomonas. After thorough discussion we proceeded to add  in MRSA coverage with Zyvox and antifungal coverage with micafungin.   He has NOW been found to have a FISTULOUS  connection between the residual  abscess cavity and the  adjacent small bowel in the left upper quadrant by IR.  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 Luciana Axe is available for questions this weekend and I will be back on Monday.   LOS: 24 days   Acey Lav 07/25/2014, 4:55 PM

## 2014-07-26 LAB — COMPREHENSIVE METABOLIC PANEL
ALK PHOS: 77 U/L (ref 39–117)
ALT: 29 U/L (ref 0–53)
ANION GAP: 13 (ref 5–15)
AST: 25 U/L (ref 0–37)
Albumin: 2.5 g/dL — ABNORMAL LOW (ref 3.5–5.2)
BUN: 14 mg/dL (ref 6–23)
CO2: 24 mEq/L (ref 19–32)
CREATININE: 1.05 mg/dL (ref 0.50–1.35)
Calcium: 9 mg/dL (ref 8.4–10.5)
Chloride: 101 mEq/L (ref 96–112)
GFR, EST NON AFRICAN AMERICAN: 83 mL/min — AB (ref >90)
GLUCOSE: 115 mg/dL — AB (ref 70–99)
POTASSIUM: 4.4 meq/L (ref 3.7–5.3)
Sodium: 138 mEq/L (ref 137–147)
Total Protein: 7 g/dL (ref 6.0–8.3)

## 2014-07-26 LAB — CBC
HCT: 28.2 % — ABNORMAL LOW (ref 39.0–52.0)
Hemoglobin: 9.2 g/dL — ABNORMAL LOW (ref 13.0–17.0)
MCH: 28.4 pg (ref 26.0–34.0)
MCHC: 32.6 g/dL (ref 30.0–36.0)
MCV: 87 fL (ref 78.0–100.0)
PLATELETS: 438 10*3/uL — AB (ref 150–400)
RBC: 3.24 MIL/uL — ABNORMAL LOW (ref 4.22–5.81)
RDW: 14.4 % (ref 11.5–15.5)
WBC: 10.9 10*3/uL — ABNORMAL HIGH (ref 4.0–10.5)

## 2014-07-26 LAB — GLUCOSE, CAPILLARY
GLUCOSE-CAPILLARY: 117 mg/dL — AB (ref 70–99)
GLUCOSE-CAPILLARY: 103 mg/dL — AB (ref 70–99)
Glucose-Capillary: 105 mg/dL — ABNORMAL HIGH (ref 70–99)
Glucose-Capillary: 88 mg/dL (ref 70–99)

## 2014-07-26 LAB — PHOSPHORUS: Phosphorus: 4.7 mg/dL — ABNORMAL HIGH (ref 2.3–4.6)

## 2014-07-26 LAB — MAGNESIUM: MAGNESIUM: 2.2 mg/dL (ref 1.5–2.5)

## 2014-07-26 MED ORDER — FAT EMULSION 20 % IV EMUL
250.0000 mL | INTRAVENOUS | Status: AC
  Administered 2014-07-26: 250 mL via INTRAVENOUS
  Filled 2014-07-26: qty 250

## 2014-07-26 MED ORDER — INSULIN ASPART 100 UNIT/ML ~~LOC~~ SOLN
0.0000 [IU] | Freq: Four times a day (QID) | SUBCUTANEOUS | Status: DC
  Administered 2014-07-27 (×2): 1 [IU] via SUBCUTANEOUS

## 2014-07-26 MED ORDER — TRACE MINERALS CR-CU-F-FE-I-MN-MO-SE-ZN IV SOLN
INTRAVENOUS | Status: AC
  Administered 2014-07-26 (×2): via INTRAVENOUS
  Filled 2014-07-26: qty 3000

## 2014-07-26 NOTE — Progress Notes (Signed)
25 Days Post-Op  Subjective: Had a little bloating at times.  Still passing gas and having BMs.  Objective: Vital signs in last 24 hours: Temp:  [97.4 F (36.3 C)-97.8 F (36.6 C)] 97.6 F (36.4 C) (10/17 0518) Pulse Rate:  [69-73] 73 (10/17 0518) Resp:  [16-18] 16 (10/17 0518) BP: (106-116)/(70-76) 106/76 mmHg (10/17 0518) SpO2:  [100 %] 100 % (10/17 0518) Last BM Date: 07/25/14  Intake/Output from previous day: 10/16 0701 - 10/17 0700 In: 2413.3 [I.V.:113.3; IV Piggyback:2300] Out: 15 [Drains:15] Intake/Output this shift:    PE: General- In NAD Abdomen-soft, not distended, wounds clean and intact, clear drain output, active bowel sounds  Lab Results:   Recent Labs  07/25/14 0600 07/26/14 0438  WBC 12.8* 10.9*  HGB 9.0* 9.2*  HCT 27.5* 28.2*  PLT 477* 438*   BMET  Recent Labs  07/24/14 0525 07/26/14 0438  NA 136* 138  K 3.8 4.4  CL 98 101  CO2 25 24  GLUCOSE 86 115*  BUN 14 14  CREATININE 0.99 1.05  CALCIUM 9.2 9.0   PT/INR No results found for this basename: LABPROT, INR,  in the last 72 hours Comprehensive Metabolic Panel:    Component Value Date/Time   NA 138 07/26/2014 0438   NA 136* 07/24/2014 0525   K 4.4 07/26/2014 0438   K 3.8 07/24/2014 0525   CL 101 07/26/2014 0438   CL 98 07/24/2014 0525   CO2 24 07/26/2014 0438   CO2 25 07/24/2014 0525   BUN 14 07/26/2014 0438   BUN 14 07/24/2014 0525   CREATININE 1.05 07/26/2014 0438   CREATININE 0.99 07/24/2014 0525   GLUCOSE 115* 07/26/2014 0438   GLUCOSE 86 07/24/2014 0525   CALCIUM 9.0 07/26/2014 0438   CALCIUM 9.2 07/24/2014 0525   AST 25 07/26/2014 0438   AST 22 07/24/2014 0525   ALT 29 07/26/2014 0438   ALT 24 07/24/2014 0525   ALKPHOS 77 07/26/2014 0438   ALKPHOS 78 07/24/2014 0525   BILITOT <0.2* 07/26/2014 0438   BILITOT 0.2* 07/24/2014 0525   PROT 7.0 07/26/2014 0438   PROT 7.1 07/24/2014 0525   ALBUMIN 2.5* 07/26/2014 0438   ALBUMIN 2.4* 07/24/2014 0525      Studies/Results: Ir Sinus/fist Tube Chk-non Gi  07/24/2014   CLINICAL DATA:  48 year old male with a history of diverticulitis complicated by intra-abdominal abscess ease and prolonged ileus. The currently has a percutaneous 12 French drain in a abscess collection just inferior to the stomach in the region of the foramen of [place]. Clinically, the patient is improving. However on repeat CT imaging there is a persistent collection just superior to the pigtail of the drainage catheter. Drain injection with a possible drain manipulation, exchange or up size under fluoroscopy is warranted.  EXAM: SINUS TRACT INJECTION/FISTULOGRAM; IR CATHETER TUBE CHANGE  Date: 07/24/2014  PROCEDURE: 1. Drain injection under fluoroscopy 2. Manual disruption of loculations 3. Exchange and up size for a 14 [place] cook all-purpose drainage catheter Interventional Radiologist:  Sterling Big, MD  ANESTHESIA/SEDATION: None required  FLUOROSCOPY TIME:  5 minutes 18 seconds  CONTRAST:  20 mL Omnipaque 300  TECHNIQUE: Informed consent was obtained from the patient following explanation of the procedure, risks, benefits and alternatives. The patient understands, agrees and consents for the procedure. All questions were addressed. A time out was performed.  Maximal barrier sterile technique utilized including caps, mask, sterile gowns, sterile gloves, large sterile drape, hand hygiene, and Betadine skin prep.  A gentle hand  injection of contrast material was performed through the existing catheter. The catheter was very difficult to inject consistent with at least partial occlusion. The contrast material opacifies a cavity about the locking loop of the catheter. There is a fistulous connection with the adjacent small bowel in the left upper quadrant. The catheter was cut and removed over a Bentson wire. The catheter was inspected and found to be almost completely clogged with thick tenacious debris.  A Kumpe catheter was  advanced into the cavity over the Bentson wire. Using a glidewire, the loculations within the abscess collection or disrupted until contrast material is noted to extend more superior into the undrained portion of the collection. The glidewire was exchanged for a Bentson wire through an angled catheter. A new Cook 14 [place] all-purpose drainage catheter was advanced over the wire and formed within the collection. Approximately 20 mL of thick purulent fluid and debris were aspirated. The catheter was gently flush the connected to JP bulb suction. The catheter was secured to the skin with 0 Prolene suture. The patient tolerated the procedure well.  COMPLICATIONS: None.  IMPRESSION: 1. Nearly completely occluded existing 12 French percutaneous drain 2. Fistulous connection between the residual abscess cavity and the adjacent small bowel in the left upper quadrant. 3. Successful manual disruption of internal loculations resulting in increased communication to the undrained component of the abscess cavity. 4. Exchange and up-size for a new 14 French percutaneous drainage catheter. The drain was connected to JP bulb suction.  PLAN: 1. Maintained 14 French drain to JP bulb suction and flush 3 times daily. 2. Given the presence of adjacent small bowel fistula, this tube may be in place for a prolonged course. 3. Continue NPO/bowel rest with parenteral nutrition. 4. Recommend tube injection under fluoroscopy in 2-4 weeks in Interventional Radiology. Signed,  Sterling BigHeath K. McCullough, MD  [facility]  [facility]   Electronically Signed   By: Malachy MoanHeath  McCullough M.D.   On: 07/24/2014 15:49   Ir Catheter Tube Change  07/24/2014   CLINICAL DATA:  48 year old male with a history of diverticulitis complicated by intra-abdominal abscess ease and prolonged ileus. The currently has a percutaneous 12 French drain in a abscess collection just inferior to the stomach in the region of the  foramen of [place]. Clinically, the patient is improving. However on repeat CT imaging there is a persistent collection just superior to the pigtail of the drainage catheter. Drain injection with a possible drain manipulation, exchange or up size under fluoroscopy is warranted.  EXAM: SINUS TRACT INJECTION/FISTULOGRAM; IR CATHETER TUBE CHANGE  Date: 07/24/2014  PROCEDURE: 1. Drain injection under fluoroscopy 2. Manual disruption of loculations 3. Exchange and up size for a 14 [place] cook all-purpose drainage catheter Interventional Radiologist:  Sterling BigHeath K. McCullough, MD  ANESTHESIA/SEDATION: None required  FLUOROSCOPY TIME:  5 minutes 18 seconds  CONTRAST:  20 mL Omnipaque 300  TECHNIQUE: Informed consent was obtained from the patient following explanation of the procedure, risks, benefits and alternatives. The patient understands, agrees and consents for the procedure. All questions were addressed. A time out was performed.  Maximal barrier sterile technique utilized including caps, mask, sterile gowns, sterile gloves, large sterile drape, hand hygiene, and Betadine skin prep.  A gentle hand injection of contrast material was performed through the existing catheter. The catheter was very difficult to inject consistent with at least partial occlusion. The contrast material opacifies a cavity about the locking loop of the catheter. There is a fistulous  connection with the adjacent small bowel in the left upper quadrant. The catheter was cut and removed over a Bentson wire. The catheter was inspected and found to be almost completely clogged with thick tenacious debris.  A Kumpe catheter was advanced into the cavity over the Bentson wire. Using a glidewire, the loculations within the abscess collection or disrupted until contrast material is noted to extend more superior into the undrained portion of the collection. The glidewire was exchanged for a Bentson wire through an angled catheter. A new Cook 14 [place]  all-purpose drainage catheter was advanced over the wire and formed within the collection. Approximately 20 mL of thick purulent fluid and debris were aspirated. The catheter was gently flush the connected to JP bulb suction. The catheter was secured to the skin with 0 Prolene suture. The patient tolerated the procedure well.  COMPLICATIONS: None.  IMPRESSION: 1. Nearly completely occluded existing 12 French percutaneous drain 2. Fistulous connection between the residual abscess cavity and the adjacent small bowel in the left upper quadrant. 3. Successful manual disruption of internal loculations resulting in increased communication to the undrained component of the abscess cavity. 4. Exchange and up-size for a new 14 French percutaneous drainage catheter. The drain was connected to JP bulb suction.  PLAN: 1. Maintained 14 French drain to JP bulb suction and flush 3 times daily. 2. Given the presence of adjacent small bowel fistula, this tube may be in place for a prolonged course. 3. Continue NPO/bowel rest with parenteral nutrition. 4. Recommend tube injection under fluoroscopy in 2-4 weeks in Interventional Radiology. Signed,  Sterling BigHeath K. McCullough, MD  [facility]  [facility]   Electronically Signed   By: Malachy MoanHeath  McCullough M.D.   On: 07/24/2014 15:49    Anti-infectives: Anti-infectives   Start     Dose/Rate Route Frequency Ordered Stop   07/25/14 1800  ertapenem (INVANZ) 1 g in sodium chloride 0.9 % 50 mL IVPB     1 g 100 mL/hr over 30 Minutes Intravenous Every 24 hours 07/25/14 1713     07/25/14 1700  ertapenem [facility](INVANZ) injection 1 g  Status:  Discontinued     1 g Intramuscular Every 24 hours 07/25/14 1655 07/25/14 1711   07/20/14 2200  imipenem-cilastatin (PRIMAXIN) 500 mg in sodium chloride 0.9 % 100 mL IVPB  Status:  Discontinued     500 mg 200 mL/hr over 30 Minutes Intravenous Every 6 hours 07/20/14 1652 07/25/14 1655   07/18/14 1100  micafungin  (MYCAMINE) 150 mg in sodium chloride 0.9 % 100 mL IVPB  Status:  Discontinued     150 mg 100 mL/hr over 1 Hours Intravenous Every 24 hours 07/18/14 0907 07/25/14 1655   07/18/14 1000  linezolid (ZYVOX) IVPB 600 mg  Status:  Discontinued     600 mg 300 mL/hr over 60 Minutes Intravenous Every 12 hours 07/18/14 0907 07/25/14 1655   07/17/14 2000  imipenem-cilastatin (PRIMAXIN) 500 mg in sodium chloride 0.9 % 100 mL IVPB  Status:  Discontinued     500 mg 200 mL/hr over 30 Minutes Intravenous Every 6 hours 07/17/14 1743 07/20/14 1652   07/14/14 1600  ertapenem (INVANZ) 1 g in sodium chloride 0.9 % 50 mL IVPB  Status:  Discontinued     1 g 100 mL/hr over 30 Minutes Intravenous Every 24 hours 07/14/14 1457 07/17/14 1743   07/14/14 0800  piperacillin-tazobactam (ZOSYN) IVPB 3.375 g  Status:  Discontinued     3.375 g 12.5 mL/hr over  240 Minutes Intravenous Every 8 hours 07/14/14 0723 07/14/14 1456   07/08/14 2100  vancomycin (VANCOCIN) IVPB 1000 mg/200 mL premix  Status:  Discontinued     1,000 mg 200 mL/hr over 60 Minutes Intravenous Every 12 hours 07/08/14 1104 07/10/14 0832   07/08/14 0800  vancomycin (VANCOCIN) 2,000 mg in sodium chloride 0.9 % 500 mL IVPB     2,000 mg 250 mL/hr over 120 Minutes Intravenous  Once 07/08/14 0757 07/08/14 1107   07/07/14 1000  piperacillin-tazobactam (ZOSYN) IVPB 3.375 g  Status:  Discontinued     3.375 g 12.5 mL/hr over 240 Minutes Intravenous Every 8 hours 07/07/14 0821 07/10/14 0832   07/01/14 2000  cefoTEtan (CEFOTAN) 2 g in dextrose 5 % 50 mL IVPB     2 g 100 mL/hr over 30 Minutes Intravenous Every 12 hours 07/01/14 1339 07/01/14 2015   07/01/14 0625  cefoTEtan in Dextrose 5% (CEFOTAN) IVPB 2 g  Status:  Discontinued     2 g Intravenous Every 12 hours 07/01/14 0625 07/01/14 1413      Assessment Principal Problem:  Diverticulitis of left colon s/p lap assisted subtotal colectomy 07/01/2014 had a prolonged ileus which has resolved.    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  He has had continued clinical improvement overall the past 5 days.    LOS: 25 days   Plan:  Cycle TPN.  Continue IV InVanz per ID recs.   Delbra Zellars J 07/26/2014

## 2014-07-26 NOTE — Progress Notes (Signed)
Patient ID: [name] [name], male   DOB: 01-08-1966, 48 y.o.   MRN: [id number]   Referring Physician(s): CCS  Subjective:  abd fluid collection drain placed 10/3 upsized 10/15 Decreasing output Feeling better daily  Allergies: Celebrex; Sulfa antibiotics; Codeine; and Dilaudid  Medications: Prior to Admission medications   Medication Sig Start Date End Date Taking? Authorizing Provider  diphenhydrAMINE (BENADRYL) 25 MG tablet Take 25 mg by mouth every 6 (six) hours as needed for allergies.    Yes Historical Provider, MD  ondansetron (ZOFRAN-ODT) 4 MG disintegrating tablet Take 4 mg by mouth every 4 (four) hours as needed for nausea or vomiting.   Yes Historical Provider, MD  zolpidem (AMBIEN) 10 MG tablet Take 10 mg by mouth at bedtime as needed for sleep.    Yes Historical Provider, MD  colchicine 0.6 MG tablet Take 0.6 mg by mouth 2 (two) times daily as needed (For gout.).  06/04/14   Historical Provider, MD  losartan-hydrochlorothiazide (HYZAAR) 50-12.5 MG per tablet Take 0.5 tablets by mouth at bedtime.  11/18/13   Historical Provider, MD  Probiotic Product (PROBIOTIC DAILY PO) Take 1 capsule by mouth at bedtime.    Historical Provider, MD    Review of Systems  Vital Signs: BP 106/76  Pulse 73  Temp(Src) 97.6 F (36.4 C) (Oral)  Resp 16  Ht 6' (1.829 m)  Wt 103.4 kg (227 lb 15.3 oz)  BMI 30.91 kg/m2  SpO2 100%  Physical Exam  Abdominal:  L flank drain site clean and dry Sl tender Output 15 cc yesterday 10 cc in JP Output brownish fluid afeb Wbc wnl Cx +Ecoli    Imaging: Ct Abdomen Pelvis W Contrast  07/23/2014   CLINICAL DATA:  Intraabdominal abscess s/p drain placement; ileus subtotal colectomy 07/01/2014. Postoperative abscess adjacent to the pancreas.  EXAM: CT ABDOMEN AND PELVIS WITH CONTRAST  TECHNIQUE: Multidetector CT imaging of the abdomen and pelvis was performed using the standard protocol following bolus administration of intravenous contrast.   CONTRAST:  OMNIPAQUE IOHEXOL 300 MG/ML  SOLN  COMPARISON:  Multiple priors.  Most recent CT 07/16/2014.  FINDINGS: Atelectasis the lung bases.  Pigtail drainage catheter anterior to the pancreas in the abscess. The drainage catheter is unchanged in position compared to prior.  The abscess is best visualized on coronal images and compared to the prior exam from 07/16/2014. When direct comparison is made, the abscess appears slightly smaller in the craniocaudal dimension, now measuring 33 mm, previously 42 mm. On axial images, the abscess has changed configuration slightly. Axial measurement today is 32 mm x 42 mm, previously 37 mm x 34 mm.  There is persistent distention of small bowel compatible with ileus.  IMPRESSION: 1. Unchanged pigtail drainage catheter in abscess anterior to the body of the pancreas. The abscess has slightly decreased in size compared 07/16/2014. 2. Persistent ileus, not changed compared to prior CT.   Electronically Signed   By: Andreas Newport M.D.   On: 07/23/2014 12:26   Ir Sinus/fist Tube Chk-non Gi  07/24/2014   CLINICAL DATA:  48 year old male with a history of diverticulitis complicated by intra-abdominal abscess ease and prolonged ileus. The currently has a percutaneous 12 French drain in a abscess collection just inferior to the stomach in the region of the foramen of [place]. Clinically, the patient is improving. However on repeat CT imaging there is a persistent collection just superior to the pigtail of the drainage catheter. Drain injection with a possible drain manipulation, exchange or up  size under fluoroscopy is warranted.  EXAM: SINUS TRACT INJECTION/FISTULOGRAM; IR CATHETER TUBE CHANGE  Date: 07/24/2014  PROCEDURE: 1. Drain injection under fluoroscopy 2. Manual disruption of loculations 3. Exchange and up size for a 14 [place] cook all-purpose drainage catheter Interventional Radiologist:  Sterling BigHeath K. McCullough, MD  ANESTHESIA/SEDATION: None required  FLUOROSCOPY  TIME:  5 minutes 18 seconds  CONTRAST:  20 mL Omnipaque 300  TECHNIQUE: Informed consent was obtained from the patient following explanation of the procedure, risks, benefits and alternatives. The patient understands, agrees and consents for the procedure. All questions were addressed. A time out was performed.  Maximal barrier sterile technique utilized including caps, mask, sterile gowns, sterile gloves, large sterile drape, hand hygiene, and Betadine skin prep.  A gentle hand injection of contrast material was performed through the existing catheter. The catheter was very difficult to inject consistent with at least partial occlusion. The contrast material opacifies a cavity about the locking loop of the catheter. There is a fistulous connection with the adjacent small bowel in the left upper quadrant. The catheter was cut and removed over a Bentson wire. The catheter was inspected and found to be almost completely clogged with thick tenacious debris.  A Kumpe catheter was advanced into the cavity over the Bentson wire. Using a glidewire, the loculations within the abscess collection or disrupted until contrast material is noted to extend more superior into the undrained portion of the collection. The glidewire was exchanged for a Bentson wire through an angled catheter. A new Cook 14 [place] all-purpose drainage catheter was advanced over the wire and formed within the collection. Approximately 20 mL of thick purulent fluid and debris were aspirated. The catheter was gently flush the connected to JP bulb suction. The catheter was secured to the skin with 0 Prolene suture. The patient tolerated the procedure well.  COMPLICATIONS: None.  IMPRESSION: 1. Nearly completely occluded existing 12 French percutaneous drain 2. Fistulous connection between the residual abscess cavity and the adjacent small bowel in the left upper quadrant. 3. Successful manual disruption of internal loculations resulting in increased  communication to the undrained component of the abscess cavity. 4. Exchange and up-size for a new 14 French percutaneous drainage catheter. The drain was connected to JP bulb suction.  PLAN: 1. Maintained 14 French drain to JP bulb suction and flush 3 times daily. 2. Given the presence of adjacent small bowel fistula, this tube may be in place for a prolonged course. 3. Continue NPO/bowel rest with parenteral nutrition. 4. Recommend tube injection under fluoroscopy in 2-4 weeks in Interventional Radiology. Signed,  Sterling BigHeath K. McCullough, MD  [facility]  [facility]   Electronically Signed   By: Malachy MoanHeath  McCullough M.D.   On: 07/24/2014 15:49   Ir Catheter Tube Change  07/24/2014   CLINICAL DATA:  48 year old male with a history of diverticulitis complicated by intra-abdominal abscess ease and prolonged ileus. The currently has a percutaneous 12 French drain in a abscess collection just inferior to the stomach in the region of the foramen of [place]. Clinically, the patient is improving. However on repeat CT imaging there is a persistent collection just superior to the pigtail of the drainage catheter. Drain injection with a possible drain manipulation, exchange or up size under fluoroscopy is warranted.  EXAM: SINUS TRACT INJECTION/FISTULOGRAM; IR CATHETER TUBE CHANGE  Date: 07/24/2014  PROCEDURE: 1. Drain injection under fluoroscopy 2. Manual disruption of loculations 3. Exchange and up size for a 14 [place] cook all-purpose  drainage catheter Interventional Radiologist:  Sterling Big, MD  ANESTHESIA/SEDATION: None required  FLUOROSCOPY TIME:  5 minutes 18 seconds  CONTRAST:  20 mL Omnipaque 300  TECHNIQUE: Informed consent was obtained from the patient following explanation of the procedure, risks, benefits and alternatives. The patient understands, agrees and consents for the procedure. All questions were addressed. A time out was performed.  Maximal barrier  sterile technique utilized including caps, mask, sterile gowns, sterile gloves, large sterile drape, hand hygiene, and Betadine skin prep.  A gentle hand injection of contrast material was performed through the existing catheter. The catheter was very difficult to inject consistent with at least partial occlusion. The contrast material opacifies a cavity about the locking loop of the catheter. There is a fistulous connection with the adjacent small bowel in the left upper quadrant. The catheter was cut and removed over a Bentson wire. The catheter was inspected and found to be almost completely clogged with thick tenacious debris.  A Kumpe catheter was advanced into the cavity over the Bentson wire. Using a glidewire, the loculations within the abscess collection or disrupted until contrast material is noted to extend more superior into the undrained portion of the collection. The glidewire was exchanged for a Bentson wire through an angled catheter. A new Cook 14 [place] all-purpose drainage catheter was advanced over the wire and formed within the collection. Approximately 20 mL of thick purulent fluid and debris were aspirated. The catheter was gently flush the connected to JP bulb suction. The catheter was secured to the skin with 0 Prolene suture. The patient tolerated the procedure well.  COMPLICATIONS: None.  IMPRESSION: 1. Nearly completely occluded existing 12 French percutaneous drain 2. Fistulous connection between the residual abscess cavity and the adjacent small bowel in the left upper quadrant. 3. Successful manual disruption of internal loculations resulting in increased communication to the undrained component of the abscess cavity. 4. Exchange and up-size for a new 14 French percutaneous drainage catheter. The drain was connected to JP bulb suction.  PLAN: 1. Maintained 14 French drain to JP bulb suction and flush 3 times daily. 2. Given the presence of adjacent small bowel fistula, this tube may be  in place for a prolonged course. 3. Continue NPO/bowel rest with parenteral nutrition. 4. Recommend tube injection under fluoroscopy in 2-4 weeks in Interventional Radiology. Signed,  Sterling Big, MD  [facility]  [facility]   Electronically Signed   By: Malachy Moan M.D.   On: 07/24/2014 15:49    Labs:  CBC:  Recent Labs  07/23/14 0525 07/24/14 0525 07/25/14 0600 07/26/14 0438  WBC 19.1* 14.5* 12.8* 10.9*  HGB 9.0* 9.0* 9.0* 9.2*  HCT 26.7* 28.0* 27.5* 28.2*  PLT 531* 511* 477* 438*    COAGS:  Recent Labs  06/25/14 1445 07/12/14 0508  INR 1.08 1.19  APTT  --  34    BMP:  Recent Labs  07/21/14 0334 07/22/14 0445 07/24/14 0525 07/26/14 0438  NA 132* 134* 136* 138  K 4.0 4.1 3.8 4.4  CL 96 96 98 101  CO2 25 24 25 24   GLUCOSE 132* 135* 86 115*  BUN 15 17 14 14   CALCIUM 9.1 9.1 9.2 9.0  CREATININE 1.02 1.01 0.99 1.05  GFRNONAA 86* 87* >90 83*  GFRAA >90 >90 >90 >90    LIVER FUNCTION TESTS:  Recent Labs  07/19/14 0420 07/21/14 0334 07/24/14 0525 07/26/14 0438  BILITOT 0.3 0.3 0.2* <0.2*  AST 12  16 22 25   ALT 16 13 24 29   ALKPHOS 89 80 78 77  PROT 6.9 7.1 7.1 7.0  ALBUMIN 2.5* 2.5* 2.4* 2.5*    Assessment and Plan:  Abscess drain in place [facility] with drain per note + fistula per injection 10/15 Will need to be in OP IR drain clinic  He will hear from IR scheduler for time and date    I spent a total of 15 minutes face to face in clinical consultation/evaluation, greater than 50% of which was counseling/coordinating care for abscess drain  Signed: Tesia Lybrand A 07/26/2014, 12:14 PM

## 2014-07-26 NOTE — Progress Notes (Signed)
PARENTERAL NUTRITION CONSULT NOTE  Pharmacy Consult for TPN Indication: Prolonged postoperative ileus  Allergies  Allergen Reactions  . Celebrex [Celecoxib] Other (See Comments)    "Mom is allergic, so I do not take it"  . Sulfa Antibiotics Nausea And Vomiting  . Codeine Rash  . Dilaudid [Hydromorphone Hcl] Nausea And Vomiting and Anxiety   Patient Measurements: Height: 6' (182.9 cm) Weight: 227 lb 15.3 oz (103.4 kg) IBW/kg (Calculated) : 77.6  Vital Signs: Temp: 97.6 F (36.4 C) (10/17 0518) Temp Source: Oral (10/17 0518) BP: 106/76 mmHg (10/17 0518) Pulse Rate: 73 (10/17 0518) Intake/Output from previous day: 10/16 0701 - 10/17 0700 In: 2413.3 [I.V.:113.3; IV Piggyback:2300] Out: 15 [Drains:15] Intake/Output from this shift:   Labs:  Recent Labs  07/24/14 0525 07/25/14 0600 07/26/14 0438  WBC 14.5* 12.8* 10.9*  HGB 9.0* 9.0* 9.2*  HCT 28.0* 27.5* 28.2*  PLT 511* 477* 438*    Recent Labs  07/24/14 0525 07/26/14 0438  NA 136* 138  K 3.8 4.4  CL 98 101  CO2 25 24  GLUCOSE 86 115*  BUN 14 14  CREATININE 0.99 1.05  CALCIUM 9.2 9.0  MG 2.4 2.2  PHOS 4.4 4.7*  PROT 7.1 7.0  ALBUMIN 2.4* 2.5*  AST 22 25  ALT 24 29  ALKPHOS 78 77  BILITOT 0.2* <0.2*   Estimated Creatinine Clearance: 109.3 ml/min (by C-G formula based on Cr of 1.05).    Recent Labs  07/25/14 2307 07/26/14 0513 07/26/14 0804  GLUCAP 89 105* 117*   Insulin Requirements in past 24 hrs: sensitive SSI achs: 1 units   Current Nutrition:  NPO except sips with meds and popsicles TID.  IVF: NS at 10 ml/hr  Central access: PICC placed 9/29 TPN start date: 9/29  ASSESSMENT                                                                                                       HPI:  [name] [name] with recurrent sigmoid diverticulitis. S/p lap assisted subtotal colectomy on 9/22 with subsequent postop ileus. Initially, TPN was started 9/27 and stopped 10/7 when he was tolerating an enteral diet.   Pharmacy was consulted to resume TPN on 10/9 following recurrent ileus on 10/8.    Significant events:  9/27: Start TPN 10/2: Passing flatus, multiple loose BMs.  CT w/ fluid collection. 10/6: tolerating FLD, advancing to soft diet. TPN wean 10/7: TPN stopped 10/9: resume TPN for recurrent ileus, NGT to suction 10/11: NG clamping trial, starting thin liquids - 480cc intake charted 10/12: Diet advanced to pureed - 25% dinner charted. Repeat UE doppler to evaluate for clot extension; may need to remove PICC. 10/13: UE doppler completed - no propagation noted. Calorie count in progress. Tolerating some soft foods and one Ensure. Per CCS, begin weaning TNA slowly over next 48 hours. 10/14: Repeating abdominal CT. 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 NPO bowel rest ordered  Today 10/17:  Glucose - at goal of < 150  Electrolytes - Phos = 4.7, Corr Ca = 10.2   Renal - SCr WNL  I/O (no strict output recorded), minimal drain output  LFTs - WNL   TGs - WNL (9/30, 10/5, 10/8, 10/9, 10/12)  Prealbumin - 13.9 (9/30),  14.4 (10/5), 14.7 (10/9), 13.5 (10/12)  NUTRITIONAL GOALS                                                                                    RD recs: 1950-2250 Kcal, 120-140g Protein, 1.9-2.2 L/day fluid Clinimix 5/15 at a goal rate of 17000ml/hr + 20% fat emulsion at ml/hr to provide: 120 g/day protein, 2184 Kcal/day.  PLAN                                                                                                                  Increase Clinimix E 5/15 to goal and begin cycling TPN. Cycle over 18h tonight and reduce length of cycle as tolerates over next couple days.  50 ml/hr x 1h, then 17040ml/hr x 16h, then 5550ml/hr x 1h and turn-off  20% fat emulsion at 13 ml/hr over 18hr.  Standard multivitamins and trace elements added to TPN  Continue CBGs and change back  to q6h now that NPO  IVF per MD orders  TNA lab panels on Mondays & Thursdays  BMP in am  Juliette Alcideustin Yaritsa Savarino, PharmD, BCPS.   Pager: [phone] 07/26/2014, 8:40 AM Pager [phone]

## 2014-07-27 LAB — BASIC METABOLIC PANEL
Anion gap: 14 (ref 5–15)
BUN: 17 mg/dL (ref 6–23)
CO2: 23 mEq/L (ref 19–32)
Calcium: 9.3 mg/dL (ref 8.4–10.5)
Chloride: 101 mEq/L (ref 96–112)
Creatinine, Ser: 0.91 mg/dL (ref 0.50–1.35)
Glucose, Bld: 121 mg/dL — ABNORMAL HIGH (ref 70–99)
Potassium: 4.2 mEq/L (ref 3.7–5.3)
Sodium: 138 mEq/L (ref 137–147)

## 2014-07-27 LAB — CBC
HEMATOCRIT: 29.5 % — AB (ref 39.0–52.0)
Hemoglobin: 9.6 g/dL — ABNORMAL LOW (ref 13.0–17.0)
MCH: 28.4 pg (ref 26.0–34.0)
MCHC: 32.5 g/dL (ref 30.0–36.0)
MCV: 87.3 fL (ref 78.0–100.0)
Platelets: 456 10*3/uL — ABNORMAL HIGH (ref 150–400)
RBC: 3.38 MIL/uL — ABNORMAL LOW (ref 4.22–5.81)
RDW: 14.6 % (ref 11.5–15.5)
WBC: 12.4 10*3/uL — ABNORMAL HIGH (ref 4.0–10.5)

## 2014-07-27 LAB — GLUCOSE, CAPILLARY
GLUCOSE-CAPILLARY: 78 mg/dL (ref 70–99)
GLUCOSE-CAPILLARY: 126 mg/dL — AB (ref 70–99)
Glucose-Capillary: 132 mg/dL — ABNORMAL HIGH (ref 70–99)
Glucose-Capillary: 137 mg/dL — ABNORMAL HIGH (ref 70–99)
Glucose-Capillary: 70 mg/dL (ref 70–99)

## 2014-07-27 MED ORDER — FAT EMULSION 20 % IV EMUL
250.0000 mL | INTRAVENOUS | Status: AC
  Administered 2014-07-27: 250 mL via INTRAVENOUS
  Filled 2014-07-27: qty 250

## 2014-07-27 MED ORDER — TRACE MINERALS CR-CU-F-FE-I-MN-MO-SE-ZN IV SOLN
INTRAVENOUS | Status: AC
  Administered 2014-07-27: 18:00:00 via INTRAVENOUS
  Filled 2014-07-27: qty 3000

## 2014-07-27 NOTE — Plan of Care (Signed)
Problem: Discharge Progression Outcomes Goal: Barriers To Progression Addressed/Resolved Outcome: Progressing TPN changed to cyclic; tolerated 18 hr cycle.  14 hr cycle begins this pm.  JP drain output creamy brown (clearer brown yesterday).  Dr. Abbey Chattersosenbower aware and monitoring. Goal: Tolerating diet Outcome: Not Progressing Pt remains NPO due to small bowel micro leak, per MD note.  MD allowing bowel to heal.  Follow up studies planned.

## 2014-07-27 NOTE — Progress Notes (Signed)
26 Days Post-Op  Subjective: Still having some bloating at times.  Still passing gas and having BMs.  Objective: Vital signs in last 24 hours: Temp:  [97.8 F (36.6 C)-98.4 F (36.9 C)] 98.4 F (36.9 C) (10/18 0558) Pulse Rate:  [63-78] 78 (10/18 0558) Resp:  [16-18] 16 (10/18 0558) BP: (106-132)/(61-69) 132/69 mmHg (10/18 0558) SpO2:  [100 %] 100 % (10/18 0558) Last BM Date: 07/26/14  Intake/Output from previous day: 10/17 0701 - 10/18 0700 In: 3086.7 [P.O.:30; I.V.:246.7; IV Piggyback:50; TPN:2760] Out: -  Intake/Output this shift: Total I/O In: 670 [P.O.:30; I.V.:40; TPN:600] Out: -   PE: General- In NAD Abdomen-soft, not distended, wounds clean and intact, slightly purulent drain output this morning, active bowel sounds  Lab Results:   Recent Labs  07/26/14 0438 07/27/14 0433  WBC 10.9* 12.4*  HGB 9.2* 9.6*  HCT 28.2* 29.5*  PLT 438* 456*   BMET  Recent Labs  07/26/14 0438 07/27/14 0500  NA 138 138  K 4.4 4.2  CL 101 101  CO2 24 23  GLUCOSE 115* 121*  BUN 14 17  CREATININE 1.05 0.91  CALCIUM 9.0 9.3   PT/INR No results found for this basename: LABPROT, INR,  in the last 72 hours Comprehensive Metabolic Panel:    Component Value Date/Time   NA 138 07/27/2014 0500   NA 138 07/26/2014 0438   K 4.2 07/27/2014 0500   K 4.4 07/26/2014 0438   CL 101 07/27/2014 0500   CL 101 07/26/2014 0438   CO2 23 07/27/2014 0500   CO2 24 07/26/2014 0438   BUN 17 07/27/2014 0500   BUN 14 07/26/2014 0438   CREATININE 0.91 07/27/2014 0500   CREATININE 1.05 07/26/2014 0438   GLUCOSE 121* 07/27/2014 0500   GLUCOSE 115* 07/26/2014 0438   CALCIUM 9.3 07/27/2014 0500   CALCIUM 9.0 07/26/2014 0438   AST 25 07/26/2014 0438   AST 22 07/24/2014 0525   ALT 29 07/26/2014 0438   ALT 24 07/24/2014 0525   ALKPHOS 77 07/26/2014 0438   ALKPHOS 78 07/24/2014 0525   BILITOT <0.2* 07/26/2014 0438   BILITOT 0.2* 07/24/2014 0525   PROT 7.0 07/26/2014 0438   PROT 7.1  07/24/2014 0525   ALBUMIN 2.5* 07/26/2014 0438   ALBUMIN 2.4* 07/24/2014 0525     Studies/Results: No results found.  Anti-infectives: Anti-infectives   Start     Dose/Rate Route Frequency Ordered Stop   07/25/14 1800  ertapenem (INVANZ) 1 g in sodium chloride 0.9 % 50 mL IVPB     1 g 100 mL/hr over 30 Minutes Intravenous Every 24 hours 07/25/14 1713     07/25/14 1700  ertapenem [facility](INVANZ) injection 1 g  Status:  Discontinued     1 g Intramuscular Every 24 hours 07/25/14 1655 07/25/14 1711   07/20/14 2200  imipenem-cilastatin (PRIMAXIN) 500 mg in sodium chloride 0.9 % 100 mL IVPB  Status:  Discontinued     500 mg 200 mL/hr over 30 Minutes Intravenous Every 6 hours 07/20/14 1652 07/25/14 1655   07/18/14 1100  micafungin (MYCAMINE) 150 mg in sodium chloride 0.9 % 100 mL IVPB  Status:  Discontinued     150 mg 100 mL/hr over 1 Hours Intravenous Every 24 hours 07/18/14 0907 07/25/14 1655   07/18/14 1000  linezolid (ZYVOX) IVPB 600 mg  Status:  Discontinued     600 mg 300 mL/hr over 60 Minutes Intravenous Every 12 hours 07/18/14 0907 07/25/14 1655   07/17/14 2000  imipenem-cilastatin (PRIMAXIN) 500  mg in sodium chloride 0.9 % 100 mL IVPB  Status:  Discontinued     500 mg 200 mL/hr over 30 Minutes Intravenous Every 6 hours 07/17/14 1743 07/20/14 1652   07/14/14 1600  ertapenem (INVANZ) 1 g in sodium chloride 0.9 % 50 mL IVPB  Status:  Discontinued     1 g 100 mL/hr over 30 Minutes Intravenous Every 24 hours 07/14/14 1457 07/17/14 1743   07/14/14 0800  piperacillin-tazobactam (ZOSYN) IVPB 3.375 g  Status:  Discontinued     3.375 g 12.5 mL/hr over 240 Minutes Intravenous Every 8 hours 07/14/14 0723 07/14/14 1456   07/08/14 2100  vancomycin (VANCOCIN) IVPB 1000 mg/200 mL premix  Status:  Discontinued     1,000 mg 200 mL/hr over 60 Minutes Intravenous Every 12 hours 07/08/14 1104 07/10/14 0832   07/08/14 0800  vancomycin (VANCOCIN) 2,000 mg in sodium chloride 0.9 % 500 mL IVPB     2,000  mg 250 mL/hr over 120 Minutes Intravenous  Once 07/08/14 0757 07/08/14 1107   07/07/14 1000  piperacillin-tazobactam (ZOSYN) IVPB 3.375 g  Status:  Discontinued     3.375 g 12.5 mL/hr over 240 Minutes Intravenous Every 8 hours 07/07/14 0821 07/10/14 0832   07/01/14 2000  cefoTEtan (CEFOTAN) 2 g in dextrose 5 % 50 mL IVPB     2 g 100 mL/hr over 30 Minutes Intravenous Every 12 hours 07/01/14 1339 07/01/14 2015   07/01/14 0625  cefoTEtan in Dextrose 5% (CEFOTAN) IVPB 2 g  Status:  Discontinued     2 g Intravenous Every 12 hours 07/01/14 0625 07/01/14 1413      Assessment Principal Problem:  Diverticulitis of left colon s/p lap assisted subtotal colectomy 07/01/2014 had a prolonged ileus which has resolved.    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      LOS: 26 days   Plan:  Continue TPN.  Continue IV InVanz per ID recs. Repeat urine culture.  Monitor WBC.   Eulene Pekar J 07/27/2014

## 2014-07-27 NOTE — Plan of Care (Signed)
Problem: Phase III Progression Outcomes Goal: IV changed to normal saline lock Outcome: Progressing KVO IVF (has central line with IV meds)

## 2014-07-27 NOTE — Progress Notes (Signed)
PARENTERAL NUTRITION CONSULT NOTE  Pharmacy Consult for TPN Indication: Prolonged postoperative ileus  Allergies  Allergen Reactions  . Celebrex [Celecoxib] Other (See Comments)    "Mom is allergic, so I do not take it"  . Sulfa Antibiotics Nausea And Vomiting  . Codeine Rash  . Dilaudid [Hydromorphone Hcl] Nausea And Vomiting and Anxiety   Patient Measurements: Height: 6' (182.9 cm) Weight: 227 lb 15.3 oz (103.4 kg) IBW/kg (Calculated) : 77.6  Vital Signs: Temp: 98.4 F (36.9 C) (10/18 0558) Temp Source: Oral (10/18 0558) BP: 132/69 mmHg (10/18 0558) Pulse Rate: 78 (10/18 0558) Intake/Output from previous day: 10/17 0701 - 10/18 0700 In: 3086.7 [P.O.:30; I.V.:246.7; IV Piggyback:50; TPN:2760] Out: -  Intake/Output from this shift:   Labs:  Recent Labs  07/25/14 0600 07/26/14 0438 07/27/14 0433  WBC 12.8* 10.9* 12.4*  HGB 9.0* 9.2* 9.6*  HCT 27.5* 28.2* 29.5*  PLT 477* 438* 456*    Recent Labs  07/26/14 0438 07/27/14 0500  NA 138 138  K 4.4 4.2  CL 101 101  CO2 24 23  GLUCOSE 115* 121*  BUN 14 17  CREATININE 1.05 0.91  CALCIUM 9.0 9.3  MG 2.2  --   PHOS 4.7*  --   PROT 7.0  --   ALBUMIN 2.5*  --   AST 25  --   ALT 29  --   ALKPHOS 77  --   BILITOT <0.2*  --    Estimated Creatinine Clearance: 126.1 ml/min (by C-G formula based on Cr of 0.91).    Recent Labs  07/26/14 1826 07/27/14 0003 07/27/14 0555  GLUCAP 88 137* 132*   Insulin Requirements in past 24 hrs: sensitive SSI achs: 2 units   Current Nutrition:  NPO except sips with meds and popsicles TID.  IVF: NS at 10 ml/hr  Central access: PICC placed 9/29 TPN start date: 9/29  ASSESSMENT                                                                                                       HPI:  [name] [name] with recurrent sigmoid diverticulitis. S/p lap assisted subtotal colectomy on 9/22 with subsequent postop ileus. Initially, TPN was started 9/27 and stopped 10/7 when he was tolerating  an enteral diet.  Pharmacy was consulted to resume TPN on 10/9 following recurrent ileus on 10/8.    Significant events:  9/27: Start TPN 10/2: Passing flatus, multiple loose BMs.  CT w/ fluid collection. 10/6: tolerating FLD, advancing to soft diet. TPN wean 10/7: TPN stopped 10/9: resume TPN for recurrent ileus, NGT to suction 10/11: NG clamping trial, starting thin liquids - 480cc intake charted 10/12: Diet advanced to pureed - 25% dinner charted. Repeat UE doppler to evaluate for clot extension; may need to remove PICC. 10/13: UE doppler completed - no propagation noted. Calorie count in progress. Tolerating some soft foods and one Ensure. Per CCS, begin weaning TNA slowly over next 48 hours. 10/14: Repeating abdominal CT. 10/15: NPO for drain study, continue TNA 10/16: drain upsized yesterday and connection between abscess cavity and small  bowel found=> fistula. NPO bowel rest ordered 10/17: started cycling TPN  Today 10/18:  Glucose - at goal of < 150   Electrolytes - electrolytes OK, Phos = 4.7, Corr Ca = 10.2   Renal - SCr WNL  I/O (no strict output recorded), minimal drain output  LFTs - WNL   TGs - WNL (9/30, 10/5, 10/8, 10/9, 10/12)  Prealbumin - 13.9 (9/30),  14.4 (10/5), 14.7 (10/9), 13.5 (10/12)  NUTRITIONAL GOALS                                                                                    RD recs: 1950-2250 Kcal, 120-140g Protein, 1.9-2.2 L/day fluid Clinimix 5/15 at a goal rate of 16300ml/hr + 20% fat emulsion at ml/hr to provide: 120 g/day protein, 2184 Kcal/day.  PLAN                                                                                                                  Continue Clinimix E 5/15 at goal.  Tolerating 18h cycle, will attempt to cycle over 14hrs.   50 ml/hr x 1h, then 12590ml/hr x 12h, then 6050ml/hr x 1h and turn-off  Monitor blood sugars.    20% fat emulsion at 17 ml/hr over 14hr.  Standard multivitamins and trace elements added  to TPN  Continue CBGs and SSI q6h  IVF per MD orders  TNA lab panels on Mondays & Thursdays  Juliette Alcideustin Quincy Boy, PharmD, BCPS.   Pager: [phone] 07/27/2014, 9:42 AM Pager [phone]

## 2014-07-28 LAB — TRIGLYCERIDES: Triglycerides: 101 mg/dL (ref <150)

## 2014-07-28 LAB — COMPREHENSIVE METABOLIC PANEL
ALT: 34 U/L (ref 0–53)
ANION GAP: 12 (ref 5–15)
AST: 25 U/L (ref 0–37)
Albumin: 2.6 g/dL — ABNORMAL LOW (ref 3.5–5.2)
Alkaline Phosphatase: 81 U/L (ref 39–117)
BUN: 19 mg/dL (ref 6–23)
CO2: 25 meq/L (ref 19–32)
Calcium: 9.1 mg/dL (ref 8.4–10.5)
Chloride: 100 mEq/L (ref 96–112)
Creatinine, Ser: 0.93 mg/dL (ref 0.50–1.35)
GFR calc Af Amer: >90 mL/min (ref >90)
GFR calc non Af Amer: >90 mL/min (ref >90)
Glucose, Bld: 99 mg/dL (ref 70–99)
Potassium: 4.3 mEq/L (ref 3.7–5.3)
Sodium: 137 mEq/L (ref 137–147)
Total Protein: 7 g/dL (ref 6.0–8.3)

## 2014-07-28 LAB — CBC
HCT: 21.5 % — ABNORMAL LOW (ref 39.0–52.0)
HCT: 30.7 % — ABNORMAL LOW (ref 39.0–52.0)
HEMOGLOBIN: 9.9 g/dL — AB (ref 13.0–17.0)
Hemoglobin: 7.1 g/dL — ABNORMAL LOW (ref 13.0–17.0)
MCH: 29.3 pg (ref 26.0–34.0)
MCH: 28.4 pg (ref 26.0–34.0)
MCHC: 33 g/dL (ref 30.0–36.0)
MCHC: 32.2 g/dL (ref 30.0–36.0)
MCV: 88.8 fL (ref 78.0–100.0)
MCV: 88.2 fL (ref 78.0–100.0)
PLATELETS: 494 10*3/uL — AB (ref 150–400)
PLATELETS: 404 10*3/uL — AB (ref 150–400)
RBC: 2.42 MIL/uL — ABNORMAL LOW (ref 4.22–5.81)
RBC: 3.48 MIL/uL — AB (ref 4.22–5.81)
RDW: 14.7 % (ref 11.5–15.5)
RDW: 14.7 % (ref 11.5–15.5)
WBC: 12.5 10*3/uL — ABNORMAL HIGH (ref 4.0–10.5)
WBC: 11 10*3/uL — AB (ref 4.0–10.5)

## 2014-07-28 LAB — DIFFERENTIAL
BASOS ABS: 0.1 10*3/uL (ref 0.0–0.1)
Basophils Relative: 1 % (ref 0–1)
EOS PCT: 5 % (ref 0–5)
Eosinophils Absolute: 0.7 10*3/uL (ref 0.0–0.7)
LYMPHS ABS: 2 10*3/uL (ref 0.7–4.0)
Lymphocytes Relative: 16 % (ref 12–46)
MONO ABS: 1.5 10*3/uL — AB (ref 0.1–1.0)
Monocytes Relative: 12 % (ref 3–12)
Neutro Abs: 8.2 10*3/uL — ABNORMAL HIGH (ref 1.7–7.7)
Neutrophils Relative %: 66 % (ref 43–77)

## 2014-07-28 LAB — MAGNESIUM: Magnesium: 2.1 mg/dL (ref 1.5–2.5)

## 2014-07-28 LAB — PHOSPHORUS: Phosphorus: 4.8 mg/dL — ABNORMAL HIGH (ref 2.3–4.6)

## 2014-07-28 LAB — PREALBUMIN: Prealbumin: 23.1 mg/dL (ref 17.0–34.0)

## 2014-07-28 LAB — GLUCOSE, CAPILLARY
GLUCOSE-CAPILLARY: 89 mg/dL (ref 70–99)
Glucose-Capillary: 115 mg/dL — ABNORMAL HIGH (ref 70–99)

## 2014-07-28 MED ORDER — TRAMADOL HCL 50 MG PO TABS: 50.0000 mg | ORAL_TABLET | Freq: Four times a day (QID) | ORAL | Status: DC | PRN

## 2014-07-28 MED ORDER — HEPARIN SOD (PORK) LOCK FLUSH 100 UNIT/ML IV SOLN
250.0000 [IU] | INTRAVENOUS | Status: DC | PRN
  Administered 2014-07-28: 500 [IU]
  Filled 2014-07-28: qty 3

## 2014-07-28 MED ORDER — SODIUM CHLORIDE 0.9 % IV SOLN: 1.0000 g | INTRAVENOUS | Status: DC

## 2014-07-28 MED ORDER — HEPARIN SOD (PORK) LOCK FLUSH 100 UNIT/ML IV SOLN
250.0000 [IU] | Freq: Every day | INTRAVENOUS | Status: DC
  Filled 2014-07-28: qty 3

## 2014-07-28 MED ORDER — DIPHENHYDRAMINE HCL 50 MG/ML IJ SOLN: 12.5000 mg | Freq: Four times a day (QID) | INTRAMUSCULAR | Status: DC | PRN

## 2014-07-28 NOTE — Discharge Instructions (Signed)
Sips of clear liquids.  Take a chewable multivitamin with Iron daily.  Call for severe abdominal pain, high fever, vomiting, drain problems.  My office will call you about your appointment.

## 2014-07-28 NOTE — Progress Notes (Signed)
27 Days Post-Op  Subjective: No complaints.  Objective: Vital signs in last 24 hours: Temp:  [97.8 F (36.6 C)-98 F (36.7 C)] 98 F (36.7 C) (10/19 0618) Pulse Rate:  [68-78] 72 (10/19 0618) Resp:  [16-18] 16 (10/19 0618) BP: (83-115)/(46-70) 92/60 mmHg (10/19 0625) SpO2:  [97 %-100 %] 97 % (10/19 0618) Last BM Date: 07/26/14  Intake/Output from previous day: 10/18 0701 - 10/19 0700 In: 3719 [P.O.:75; I.V.:240; IV Piggyback:50; TPN:3344] Out: 370 [Urine:350; Drains:20] Intake/Output this shift:    PE: General- In NAD Abdomen-soft, not distended, wounds clean and intact, creamy, purulent drain output this morning  Lab Results:   Recent Labs  07/28/14 0452 07/28/14 0745  WBC 12.5* 11.0*  HGB 7.1* 9.9*  HCT 21.5* 30.7*  PLT 494* 404*   BMET  Recent Labs  07/27/14 0500 07/28/14 0452  NA 138 137  K 4.2 4.3  CL 101 100  CO2 23 25  GLUCOSE 121* 99  BUN 17 19  CREATININE 0.91 0.93  CALCIUM 9.3 9.1   PT/INR No results found for this basename: LABPROT, INR,  in the last 72 hours Comprehensive Metabolic Panel:    Component Value Date/Time   NA 137 07/28/2014 0452   NA 138 07/27/2014 0500   K 4.3 07/28/2014 0452   K 4.2 07/27/2014 0500   CL 100 07/28/2014 0452   CL 101 07/27/2014 0500   CO2 25 07/28/2014 0452   CO2 23 07/27/2014 0500   BUN 19 07/28/2014 0452   BUN 17 07/27/2014 0500   CREATININE 0.93 07/28/2014 0452   CREATININE 0.91 07/27/2014 0500   GLUCOSE 99 07/28/2014 0452   GLUCOSE 121* 07/27/2014 0500   CALCIUM 9.1 07/28/2014 0452   CALCIUM 9.3 07/27/2014 0500   AST 25 07/28/2014 0452   AST 25 07/26/2014 0438   ALT 34 07/28/2014 0452   ALT 29 07/26/2014 0438   ALKPHOS 81 07/28/2014 0452   ALKPHOS 77 07/26/2014 0438   BILITOT <0.2* 07/28/2014 0452   BILITOT <0.2* 07/26/2014 0438   PROT 7.0 07/28/2014 0452   PROT 7.0 07/26/2014 0438   ALBUMIN 2.6* 07/28/2014 0452   ALBUMIN 2.5* 07/26/2014 0438     Studies/Results: No results  found.  Anti-infectives: Anti-infectives   Start     Dose/Rate Route Frequency Ordered Stop   07/25/14 1800  ertapenem (INVANZ) 1 g in sodium chloride 0.9 % 50 mL IVPB     1 g 100 mL/hr over 30 Minutes Intravenous Every 24 hours 07/25/14 1713     07/25/14 1700  ertapenem [facility](INVANZ) injection 1 g  Status:  Discontinued     1 g Intramuscular Every 24 hours 07/25/14 1655 07/25/14 1711   07/20/14 2200  imipenem-cilastatin (PRIMAXIN) 500 mg in sodium chloride 0.9 % 100 mL IVPB  Status:  Discontinued     500 mg 200 mL/hr over 30 Minutes Intravenous Every 6 hours 07/20/14 1652 07/25/14 1655   07/18/14 1100  micafungin (MYCAMINE) 150 mg in sodium chloride 0.9 % 100 mL IVPB  Status:  Discontinued     150 mg 100 mL/hr over 1 Hours Intravenous Every 24 hours 07/18/14 0907 07/25/14 1655   07/18/14 1000  linezolid (ZYVOX) IVPB 600 mg  Status:  Discontinued     600 mg 300 mL/hr over 60 Minutes Intravenous Every 12 hours 07/18/14 0907 07/25/14 1655   07/17/14 2000  imipenem-cilastatin (PRIMAXIN) 500 mg in sodium chloride 0.9 % 100 mL IVPB  Status:  Discontinued     500 mg 200  mL/hr over 30 Minutes Intravenous Every 6 hours 07/17/14 1743 07/20/14 1652   07/14/14 1600  ertapenem (INVANZ) 1 g in sodium chloride 0.9 % 50 mL IVPB  Status:  Discontinued     1 g 100 mL/hr over 30 Minutes Intravenous Every 24 hours 07/14/14 1457 07/17/14 1743   07/14/14 0800  piperacillin-tazobactam (ZOSYN) IVPB 3.375 g  Status:  Discontinued     3.375 g 12.5 mL/hr over 240 Minutes Intravenous Every 8 hours 07/14/14 0723 07/14/14 1456   07/08/14 2100  vancomycin (VANCOCIN) IVPB 1000 mg/200 mL premix  Status:  Discontinued     1,000 mg 200 mL/hr over 60 Minutes Intravenous Every 12 hours 07/08/14 1104 07/10/14 0832   07/08/14 0800  vancomycin (VANCOCIN) 2,000 mg in sodium chloride 0.9 % 500 mL IVPB     2,000 mg 250 mL/hr over 120 Minutes Intravenous  Once 07/08/14 0757 07/08/14 1107   07/07/14 1000  piperacillin-tazobactam  (ZOSYN) IVPB 3.375 g  Status:  Discontinued     3.375 g 12.5 mL/hr over 240 Minutes Intravenous Every 8 hours 07/07/14 0821 07/10/14 0832   07/01/14 2000  cefoTEtan (CEFOTAN) 2 g in dextrose 5 % 50 mL IVPB     2 g 100 mL/hr over 30 Minutes Intravenous Every 12 hours 07/01/14 1339 07/01/14 2015   07/01/14 0625  cefoTEtan in Dextrose 5% (CEFOTAN) IVPB 2 g  Status:  Discontinued     2 g Intravenous Every 12 hours 07/01/14 0625 07/01/14 1413      Assessment Principal Problem:  Diverticulitis of left colon s/p lap assisted subtotal colectomy 07/01/2014 had a prolonged ileus which has resolved.    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      LOS: 27 days   Plan: Discharge home today or tomorrow with drain in, on home TPN and daily IV InVanz at home.  Discharge instructions discussed with him.   Unnamed [name] [name] 07/28/2014

## 2014-07-28 NOTE — Progress Notes (Signed)
PARENTERAL NUTRITION CONSULT NOTE  Pharmacy Consult for TPN Indication: Prolonged postoperative ileus  Allergies  Allergen Reactions  . Celebrex [Celecoxib] Other (See Comments)    "Mom is allergic, so I do not take it"  . Sulfa Antibiotics Nausea And Vomiting  . Codeine Rash  . Dilaudid [Hydromorphone Hcl] Nausea And Vomiting and Anxiety   Patient Measurements: Height: 6' (182.9 cm) Weight: 227 lb 15.3 oz (103.4 kg) IBW/kg (Calculated) : 77.6  Vital Signs: Temp: 98 F (36.7 C) (10/19 0618) Temp Source: Oral (10/19 0618) BP: 92/60 mmHg (10/19 0625) Pulse Rate: 72 (10/19 0618) Intake/Output from previous day: 10/18 0701 - 10/19 0700 In: 3719 [P.O.:75; I.V.:240; IV Piggyback:50; TPN:3344] Out: 370 [Urine:350; Drains:20] Intake/Output from this shift:   Labs:  Recent Labs  07/27/14 0433 07/28/14 0452 07/28/14 0745  WBC 12.4* 12.5* 11.0*  HGB 9.6* 7.1* 9.9*  HCT 29.5* 21.5* 30.7*  PLT 456* 494* 404*    Recent Labs  07/26/14 0438 07/27/14 0500 07/28/14 0452  NA 138 138 137  K 4.4 4.2 4.3  CL 101 101 100  CO2 24 23 25   GLUCOSE 115* 121* 99  BUN 14 17 19   CREATININE 1.05 0.91 0.93  CALCIUM 9.0 9.3 9.1  MG 2.2  --  2.1  PHOS 4.7*  --  4.8*  PROT 7.0  --  7.0  ALBUMIN 2.5*  --  2.6*  AST 25  --  25  ALT 29  --  34  ALKPHOS 77  --  81  BILITOT <0.2*  --  <0.2*  TRIG  --   --  101   Estimated Creatinine Clearance: 123.4 ml/min (by C-G formula based on Cr of 0.93).    Recent Labs  07/27/14 1835 07/27/14 2321 07/28/14 0612  GLUCAP 70 126* 115*   Insulin Requirements in past 24 hrs: sensitive SSI achs: 0 units   Current Nutrition:  NPO except sips with meds and popsicles TID.  IVF: NS at 10 ml/hr  Central access: PICC placed 9/29 TPN start date: 9/29  ASSESSMENT                                                                                                       HPI:  [name] with recurrent sigmoid diverticulitis. S/p lap assisted subtotal  colectomy on 9/22 with subsequent postop ileus. Initially, TPN was started 9/27 and stopped 10/7 when he was tolerating an enteral diet.  Pharmacy was consulted to resume TPN on 10/9 following recurrent ileus on 10/8.    Significant events:  9/27: Start TPN 10/2: Passing flatus, multiple loose BMs.  CT w/ fluid collection. 10/6: tolerating FLD, advancing to soft diet. TPN wean 10/7: TPN stopped 10/9: resume TPN for recurrent ileus, NGT to suction 10/11: NG clamping trial, starting thin liquids - 480cc intake charted 10/12: Diet advanced to pureed - 25% dinner charted. Repeat UE doppler to evaluate for clot extension; may need to remove PICC. 10/13: UE doppler completed - no propagation noted. Calorie count in progress. Tolerating some soft foods and one Ensure. Per CCS, begin weaning  TNA slowly over next 48 hours. 10/14: Repeating abdominal CT. 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 NPO bowel rest ordered 10/17: started cycling TPN  Today 10/18:  Glucose - at goal of < 150   Electrolytes - electrolytes OK, Phos = 4.8, Corr Ca = 10.2   Renal - SCr WNL  I/O (no strict output recorded), minimal drain output  LFTs - WNL   TGs - WNL (9/30, 10/5, 10/8, 10/9, 10/12, 10/19)  Prealbumin - 13.9 (9/30),  14.4 (10/5), 14.7 (10/9), 13.5 (10/12), pending (10/19)  NUTRITIONAL GOALS                                                                                    RD recs: 1950-2250 Kcal, 120-140g Protein, 1.9-2.2 L/day fluid Clinimix 5/15 at a goal rate of 15100ml/hr + 20% fat emulsion at ml/hr to provide: 120 g/day protein, 2184 Kcal/day.  PLAN                                                                                   [facility] to provide TPN for tonight and while patient discharged  Continue CBGs and SSI q6h  IVF per MD orders  [name], PharmD Pager:  [phone] 07/28/2014 10:21 AM

## 2014-07-28 NOTE — Progress Notes (Signed)
[facility]  Day # 3 invanz  8 days of  imipenem 8 days of zyvox 8 days of  micafungin  4 day of Invanz  Subjective: Feels better   Antibiotics:  Anti-infectives   Start     Dose/Rate Route Frequency Ordered Stop   07/28/14 0000  ertapenem 1 g in sodium chloride 0.9 % 50 mL    Comments:  For 30 days.   1 g 100 mL/hr over 30 Minutes Intravenous Every 24 hours 07/28/14 0856     07/25/14 1800  ertapenem (INVANZ) 1 g in sodium chloride 0.9 % 50 mL IVPB     1 g 100 mL/hr over 30 Minutes Intravenous Every 24 hours 07/25/14 1713     07/25/14 1700  ertapenem [facility]) injection 1 g  Status:  Discontinued     1 g Intramuscular Every 24 hours 07/25/14 1655 07/25/14 1711   07/20/14 2200  imipenem-cilastatin (PRIMAXIN) 500 mg in sodium chloride 0.9 % 100 mL IVPB  Status:  Discontinued     500 mg 200 mL/hr over 30 Minutes Intravenous Every 6 hours 07/20/14 1652 07/25/14 1655   07/18/14 1100  micafungin (MYCAMINE) 150 mg in sodium chloride 0.9 % 100 mL IVPB  Status:  Discontinued     150 mg 100 mL/hr over 1 Hours Intravenous Every 24 hours 07/18/14 0907 07/25/14 1655   07/18/14 1000  linezolid (ZYVOX) IVPB 600 mg  Status:  Discontinued     600 mg 300 mL/hr over 60 Minutes Intravenous Every 12 hours 07/18/14 0907 07/25/14 1655   07/17/14 2000  imipenem-cilastatin (PRIMAXIN) 500 mg in sodium chloride 0.9 % 100 mL IVPB  Status:  Discontinued     500 mg 200 mL/hr over 30 Minutes Intravenous Every 6 hours 07/17/14 1743 07/20/14 1652   07/14/14 1600  ertapenem (INVANZ) 1 g in sodium chloride 0.9 % 50 mL IVPB  Status:  Discontinued     1 g 100 mL/hr over 30 Minutes Intravenous Every 24 hours 07/14/14 1457 07/17/14 1743   07/14/14 0800  piperacillin-tazobactam (ZOSYN) IVPB 3.375 g  Status:  Discontinued     3.375 g 12.5 mL/hr over 240 Minutes Intravenous Every 8 hours 07/14/14 0723 07/14/14 1456   07/08/14 2100  vancomycin (VANCOCIN) IVPB 1000 mg/200 mL premix   Status:  Discontinued     1,000 mg 200 mL/hr over 60 Minutes Intravenous Every 12 hours 07/08/14 1104 07/10/14 0832   07/08/14 0800  vancomycin (VANCOCIN) 2,000 mg in sodium chloride 0.9 % 500 mL IVPB     2,000 mg 250 mL/hr over 120 Minutes Intravenous  Once 07/08/14 0757 07/08/14 1107   07/07/14 1000  piperacillin-tazobactam (ZOSYN) IVPB 3.375 g  Status:  Discontinued     3.375 g 12.5 mL/hr over 240 Minutes Intravenous Every 8 hours 07/07/14 0821 07/10/14 0832   07/01/14 2000  cefoTEtan (CEFOTAN) 2 g in dextrose 5 % 50 mL IVPB     2 g 100 mL/hr over 30 Minutes Intravenous Every 12 hours 07/01/14 1339 07/01/14 2015   07/01/14 0625  cefoTEtan in Dextrose 5% (CEFOTAN) IVPB 2 g  Status:  Discontinued     2 g Intravenous Every 12 hours 07/01/14 0625 07/01/14 1413      Medications: Scheduled Meds: . acetaminophen  1,000 mg Oral TID  . bismuth subsalicylate  30 mL Oral BID AC  . enoxaparin (LOVENOX) injection  50 mg Subcutaneous Q24H  . ertapenem  1 g Intravenous Q24H  . heparin lock  flush  250 Units Intracatheter Daily  . losartan  25 mg Oral QHS   And  . hydrochlorothiazide  6.25 mg Oral QHS  . insulin aspart  0-9 Units Subcutaneous 4 times per day  . lip balm  1 application Topical BID  . saccharomyces boulardii  250 mg Oral BID   Continuous Infusions: . sodium chloride 10 mL/hr at 07/27/14 2201   PRN Meds:.alum & mag hydroxide-simeth, colchicine, diphenhydrAMINE, diphenhydrAMINE, heparin lock flush, LORazepam, magic mouthwash, menthol-cetylpyridinium, metoprolol, morphine injection, ondansetron (ZOFRAN) IV, ondansetron, phenol, promethazine, sodium chloride, zolpidem    Objective: Weight change:   Intake/Output Summary (Last 24 hours) at 07/28/14 1624 Last data filed at 07/28/14 1000  Gross per 24 hour  Intake   2744 ml  Output     10 ml  Net   2734 ml   Blood pressure 92/60, pulse 72, temperature 98 F (36.7 C), temperature source Oral, resp. rate 16, height 6' (1.829  m), weight 227 lb 15.3 oz (103.4 kg), SpO2 97.00%. Temp:  [98 F (36.7 C)] 98 F (36.7 C) (10/19 0618) Pulse Rate:  [68-78] 72 (10/19 0618) Resp:  [16-18] 16 (10/19 0618) BP: (83-115)/(46-70) 92/60 mmHg (10/19 0625) SpO2:  [97 %-100 %] 97 % (10/19 0618)  Physical Exam: General: patient was napping, discussed CT findings with him Neuro: nonfocal, strength and sensation intact   CBC:  CBC Latest Ref Rng 07/28/2014 07/28/2014 07/27/2014  WBC 4.0 - 10.5 K/uL 11.0(H) 12.5(H) 12.4(H)  Hemoglobin 13.0 - 17.0 g/dL 1.6(X9.9(L) 7.1(L) 9.6(L)  Hematocrit 39.0 - 52.0 % 30.7(L) 21.5(L) 29.5(L)  Platelets 150 - 400 K/uL 404(H) 494(H) 456(H)      BMET  Recent Labs  07/27/14 0500 07/28/14 0452  NA 138 137  K 4.2 4.3  CL 101 100  CO2 23 25  GLUCOSE 121* 99  BUN 17 19  CREATININE 0.91 0.93  CALCIUM 9.3 9.1     Liver Panel   Recent Labs  07/26/14 0438 07/28/14 0452  PROT 7.0 7.0  ALBUMIN 2.5* 2.6*  AST 25 25  ALT 29 34  ALKPHOS 77 81  BILITOT <0.2* <0.2*       Sedimentation Rate No results found for this basename: ESRSEDRATE,  in the last 72 hours C-Reactive Protein No results found for this basename: CRP,  in the last 72 hours  Micro Results: Recent Results (from the past 720 hour(s))  CLOSTRIDIUM DIFFICILE BY PCR     Status: None   Collection Time    07/11/14  9:45 AM      Result Value Ref Range Status   C difficile by pcr NEGATIVE  NEGATIVE Final   Comment: Performed at [facility]   CULTURE, ROUTINE-ABSCESS     Status: None   Collection Time    07/12/14  8:53 AM      Result Value Ref Range Status   Specimen Description ABSCESS PERITONEAL   Final   Special Requests Normal   Final   Gram Stain     Final   Value: MODERATE WBC PRESENT, PREDOMINANTLY MONONUCLEAR     FEW WBC PRESENT, PREDOMINANTLY PMN     NO     SQUAMOUS EPITHELIAL CELLS PRESENT     MODERATE GRAM NEGATIVE RODS   Culture     Final   Value: ABUNDANT ESCHERICHIA COLI     Note:  Confirmed Extended Spectrum Beta-Lactamase Producer (ESBL) CRITICAL RESULT CALLED TO, READ BACK BY AND VERIFIED WITH: KATHERINE STRUPE 07/14/14 1115 BY SMITHERSJ  Performed at [facility]   Report Status 07/14/2014 FINAL   Final   Organism ID, Bacteria ESCHERICHIA COLI   Final  CLOSTRIDIUM DIFFICILE BY PCR     Status: None   Collection Time    07/17/14  3:32 PM      Result Value Ref Range Status   C difficile by pcr NEGATIVE  NEGATIVE Final   Comment: Performed at [facility]  CULTURE, BLOOD (ROUTINE X 2)     Status: None   Collection Time    07/18/14  8:59 AM      Result Value Ref Range Status   Specimen Description BLOOD LEFT ARM   Final   Special Requests BOTTLES DRAWN AEROBIC AND ANAEROBIC 10CC EA   Final   Culture  Setup Time     Final   Value: 07/18/2014 12:19     Performed at [facility]   Culture     Final   Value: NO GROWTH 5 DAYS     Performed at [facility]   Report Status 07/24/2014 FINAL   Final  CULTURE, BLOOD (ROUTINE X 2)     Status: None   Collection Time    07/18/14  9:03 AM      Result Value Ref Range Status   Specimen Description BLOOD LEFT ARM   Final   Special Requests BOTTLES DRAWN AEROBIC AND ANAEROBIC 10CC EA   Final   Culture  Setup Time     Final   Value: 07/18/2014 12:19     Performed at [facility]   Culture     Final   Value: NO GROWTH 5 DAYS     Performed at [facility]   Report Status 07/24/2014 FINAL   Final  CULTURE, ROUTINE-ABSCESS     Status: None   Collection Time    07/18/14 11:03 AM      Result Value Ref Range Status   Specimen Description ABDOMEN JPD   Final   Special Requests NONE   Final   Gram Stain     Final   Value: RARE WBC PRESENT, PREDOMINANTLY PMN     NO SQUAMOUS EPITHELIAL CELLS SEEN     MODERATE GRAM NEGATIVE RODS     Performed at [facility]   Culture     Final   Value: ABUNDANT ESCHERICHIA COLI     Note: Confirmed Extended Spectrum Beta-Lactamase  Producer (ESBL) CRITICAL RESULT CALLED TO, READ BACK BY AND VERIFIED WITH: PAULA ARMSTRONG @ 0900 ON [id number] BY [facility]     Performed at [facility]   Report Status 07/20/2014 FINAL   Final   Organism ID, Bacteria ESCHERICHIA COLI   Final  URINE CULTURE     Status: None   Collection Time    07/18/14  9:04 PM      Result Value Ref Range Status   Specimen Description URINE, CLEAN CATCH   Final   Special Requests Normal   Final   Culture  Setup Time     Final   Value: 07/19/2014 00:25     Performed at [facility]   Colony Count     Final   Value: 60,000 COLONIES/ML     Performed at [facility]   Culture     Final   Value: YEAST     Performed at [facility]   Report Status 07/20/2014 FINAL   Final    Studies/Results: No results found.  Assessment/Plan:  Principal Problem:   Diverticulitis of left colon s/p lap assisted subtotal colectomy 07/01/2014 Active Problems:   Sarcoidosis   Hypertension   Postoperative intra-abdominal abscess - ESBL resistent Ecoli   Superficial venous thrombosis of arm   Pancreatitis, acute   Protein-calorie malnutrition, moderate    [name] [name] is a 48 y.o. male with Complicated diverticulitis sp LAP total colectomy, then found to have intraabdominal abscess with ESBL. Dr Abbey Chatters was worried this last week about patient perhaps having other organisms involved in the abscess besides the ESBL.   #1 Intraabdominal abscess with ESBL and FISTULA with small bowel  -- continue  Ertanpenem 1 g daily and would set up with home IV abx with invanz for at least a month (we can shorten or lengthen if necessary)  I will arrange for HSFU in our clinic   LOS: 27 days   Acey Lav 07/28/2014, 4:24 PM

## 2014-07-29 LAB — URINE CULTURE
Colony Count: NO GROWTH
Culture: NO GROWTH
Special Requests: NORMAL

## 2014-08-01 ENCOUNTER — Telehealth (INDEPENDENT_AMBULATORY_CARE_PROVIDER_SITE_OTHER): Payer: Self-pay

## 2014-08-01 ENCOUNTER — Other Ambulatory Visit (INDEPENDENT_AMBULATORY_CARE_PROVIDER_SITE_OTHER): Payer: Self-pay | Admitting: General Surgery

## 2014-08-01 DIAGNOSIS — K651 Peritoneal abscess: Principal | ICD-10-CM

## 2014-08-01 DIAGNOSIS — T814XXA Infection following a procedure, initial encounter: Principal | ICD-10-CM

## 2014-08-01 DIAGNOSIS — IMO0001 Reserved for inherently not codable concepts without codable children: Secondary | ICD-10-CM

## 2014-08-01 NOTE — Telephone Encounter (Signed)
Pt is scheduled for CT and [facility] on 10/27 at 2:45 p.m.  Flouro will call if drain is ready to be pulled.

## 2014-08-01 NOTE — Telephone Encounter (Signed)
If drain study is negative, will start diet and leave drain in with repeat study one week after diet started.

## 2014-08-05 ENCOUNTER — Ambulatory Visit
Admission: RE | Admit: 2014-08-05 | Discharge: 2014-08-05 | Disposition: A | Discharge status: Home / Self Care | Payer: BC Managed Care – PPO | Source: Ambulatory Visit | Attending: General Surgery | Admitting: General Surgery

## 2014-08-05 DIAGNOSIS — T814XXA Infection following a procedure, initial encounter: Principal | ICD-10-CM

## 2014-08-05 DIAGNOSIS — IMO0001 Reserved for inherently not codable concepts without codable children: Secondary | ICD-10-CM

## 2014-08-05 DIAGNOSIS — K651 Peritoneal abscess: Principal | ICD-10-CM

## 2014-08-05 MED ORDER — IOHEXOL 300 MG/ML  SOLN
125.0000 mL | Freq: Once | INTRAMUSCULAR | Status: AC | PRN
  Administered 2014-08-05: 125 mL via INTRAVENOUS

## 2014-08-13 ENCOUNTER — Encounter (INDEPENDENT_AMBULATORY_CARE_PROVIDER_SITE_OTHER): Payer: Self-pay | Admitting: General Surgery

## 2014-08-13 ENCOUNTER — Other Ambulatory Visit (INDEPENDENT_AMBULATORY_CARE_PROVIDER_SITE_OTHER): Payer: Self-pay

## 2014-08-13 DIAGNOSIS — K632 Fistula of intestine: Secondary | ICD-10-CM

## 2014-08-13 NOTE — Discharge Summary (Signed)
Physician Discharge Summary  Patient ID: [name] CampanileDonald S Hoffman MRN: [id number] DOB/AGE: 01-17-1966 48 y.o.  Admit date: 07/01/2014 Discharge date: 07/28/2014  Admission Diagnoses:  Recurrent Left Colon Diverticulitis  Discharge Diagnoses:  Principal Problem:   Diverticulitis of left colon s/p lap assisted subtotal colectomy 07/01/2014 Active Problems:   Hypertension   Postoperative ileus   Postoperative intra-abdominal abscess - ESBL resistent Ecoli   Superficial venous thrombosis of arm   Protein-calorie malnutrition, moderate   Small bowel fistula   Discharged Condition: good  Hospital Course: he was taken to the operating room the day of his admission and underwent laparoscopic-assisted subtotal colectomy. He developed a postoperative ileus. He then developed a persistent leukocytosis. The ileus started to improve and he was eating. CT scan was done postoperatively within the first week which demonstrated some fluid collections but no alveus abscess. Because of his persistent leukocytosis this was subsequently repeated. An abscess in the lesser sac was noted. Percutaneous drainage of this was performed. He was put on broad-spectrum antibiotics. The abscess grew Escherichia coli that was fairly resistant. It was sensitive to Primaxin and gentamicin. He subsequently was started on Invanz. Initially his white blood cell count went down but then it rose again. Infectious disease consultation was obtained. Antibiotic coverage was broadened to include vancomycin and an antifungal. He remained on a diet. He continued to have a persistent leukocytosis.  Upper and lower extremity duplex studies were performed. There was a superficial clot in the upper extremity but no deep venous thrombosis. Another CT scan was done and the abscess cavity was smaller but not completely drained. The percutaneous drain was then upsized to a larger drain. The drain was injected and a connection with the small bowel was noted.  There was never any enteric contents coming out of the drain. He was started on TPN. His leukocytosis slowly improved. He was subsequently switched over to Dillard'snvanz. His drain output was initially cloudy been clear then cloudy again. Placed 27th postoperative day he was tolerating the TPN. White blood cell count was normalizing. He felt good and was ambulatory. Bowels are moving well. He was felt to be ready for discharge. He will go home with home health nursing for assistance with TPN and drain care. He will follow up in the office in 1 week. Repeat CT scan and drain injection will be performed in 2 weeks.  Consults: ID  Significant Diagnostic Studies: CT scans, duplex study  Discharge Exam: Blood pressure 92/60, pulse 72, temperature 98 F (36.7 C), temperature source Oral, resp. rate 16, height 6' (1.829 m), weight 227 lb 15.3 oz (103.4 kg), SpO2 97 %.   Disposition: 01-Home with HHRN     Medication List    STOP taking these medications        ciprofloxacin 500 MG tablet  Commonly known as:  CIPRO     hyoscyamine 0.125 MG SL tablet  Commonly known as:  LEVSIN SL     metroNIDAZOLE 500 MG tablet  Commonly known as:  FLAGYL     ondansetron 4 MG disintegrating tablet  Commonly known as:  ZOFRAN-ODT     oxyCODONE-acetaminophen 5-325 MG per tablet  Commonly known as:  PERCOCET/ROXICET      TAKE these medications        colchicine 0.6 MG tablet  Take 0.6 mg by mouth 2 (two) times daily as needed (For gout.).     diphenhydrAMINE 25 MG tablet  Commonly known as:  BENADRYL  Take 25 mg by mouth  every 6 (six) hours as needed for allergies.     ertapenem 1 g in sodium chloride 0.9 % 50 mL  Inject 1 g into the vein daily.     losartan-hydrochlorothiazide 50-12.5 MG per tablet  Commonly known as:  HYZAAR  Take 0.5 tablets by mouth at bedtime.     PROBIOTIC DAILY PO  Take 1 capsule by mouth at bedtime.     traMADol 50 MG tablet  Commonly known as:  ULTRAM  Take 1 tablet (50  mg total) by mouth every 6 (six) hours as needed for moderate pain or severe pain.     zolpidem 10 MG tablet  Commonly known as:  AMBIEN  Take 10 mg by mouth at bedtime as needed for sleep.         Signed: Adolph PollackOSENBOWER,Lawanda Holzheimer J 08/13/2014, 8:14 AM

## 2014-08-13 NOTE — Progress Notes (Signed)
Patient ID: [name] [name], male   DOB: Jun 23, 1966, 48 y.o.   MRN: [id number]  [name] [name] 08/13/2014 10:32 AM Location: [facility] Patient #: [id number] DOB: Jun 23, 1966 Married / Language: [place] / Race: White Male History of Present Illness [name] [name](Felisha Claytor J. Letishia Elliott MD; 08/13/2014 11:06 AM) Patient words: f/u CT and drain check.  The patient is a 48 year old male    Note:He is here for another postoperative visit. He is feeling well. We discussed his test results. He is on TPN and IV Invanz. He has an upcoming appointment with infectious disease.  Past Surgical History Maryan Puls(Christy [place], [place]; 08/13/2014 10:45 AM) Aneurysm Repair vertebral artery stent 06/2007 Colectomy 07/01/2014 Lap partial colectomy Appendectomy 07/01/2014 omentectomy 07/01/2014  Allergies Maryan Puls(Christy Moore, [place]; 08/13/2014 10:36 AM) CeleBREX *ANALGESICS - ANTI-INFLAMMATORY* Sulfamethoxazole-Trimethoprim *ANTI-INFECTIVE AGENTS - MISC.* Dilaudid *ANALGESICS - OPIOID*  Medication History Maryan Puls(Christy [place], [place]; 08/13/2014 10:37 AM) Oxycodone-Acetaminophen (5-325MG  Tablet, Oral) Active. TraMADol HCl (50MG  Tablet, Oral) Active. Zolpidem Tartrate (10MG  Tablet, Oral) Active. Colchicine (0.6MG  Tablet, Oral) Active. Losartan Potassium-HCTZ (50-12.5MG  Tablet, Oral) Active. Hyoscyamine Sulfate (0.125MG  Tab Sublingual, Sublingual) Active. Ondansetron (4MG  Tablet Disperse, Oral) Active.    Vitals Maryan Puls(Christy Moore MA; 08/13/2014 10:32 AM) 08/13/2014 10:32 AM Weight: 213 lb Height: 72in Body Surface Area: 2.22 m Body Mass Index: 28.89 kg/m Temp.: 97.84F(Temporal)  Pulse: 71 (Regular)  Resp.: 16 (Unlabored)  BP: 128/82 (Sitting, Left Arm, Standard)     Physical Exam [name] [name](Colbert Curenton J. Madalin Hughart MD; 08/13/2014 11:07 AM)  The physical exam findings are as follows: Note:General-he looks well and is in no acute distress.  Abdomen-soft, incision is clean and intact, left flank drain with clear  output.    Assessment & Plan [name] [name](Zaara Sprowl J. Georg Ang MD; 08/13/2014 11:08 AM)  S/P COLECTOMY (V45.89  Z98.89) Impression: Status post subtotal colectomy complicated by abdominal abscess from highly resistant Escherichia coli, postop ileus, small bowel fistula controlled with the drain- Clinically doing well. There has never been any enteric contents coming through the drain. Tolerating therapies (TPN and IV Invanz) at home. WBC is normal. Prealbumin. Abscess cavity resolved but fistula still present radiographically based on 08/05/14 study.  Plan: Continue current therapies. Drain study next week. If the fistula has resolved, we'll start him on a diet and repeat the drain study in 1 week before removing it. If the fistula remains, will continue current treatments.  Avel Peaceodd Kelicia Youtz

## 2014-08-19 ENCOUNTER — Ambulatory Visit (HOSPITAL_COMMUNITY)
Admission: RE | Admit: 2014-08-19 | Discharge: 2014-08-19 | Disposition: A | Discharge status: Home / Self Care | Payer: BC Managed Care – PPO | Source: Ambulatory Visit | Attending: General Surgery | Admitting: General Surgery

## 2014-08-19 ENCOUNTER — Encounter (INDEPENDENT_AMBULATORY_CARE_PROVIDER_SITE_OTHER): Payer: Self-pay | Admitting: General Surgery

## 2014-08-19 DIAGNOSIS — K632 Fistula of intestine: Secondary | ICD-10-CM

## 2014-08-19 DIAGNOSIS — K651 Peritoneal abscess: Secondary | ICD-10-CM | POA: Insufficient documentation

## 2014-08-19 MED ORDER — IOHEXOL 300 MG/ML  SOLN
50.0000 mL | Freq: Once | INTRAMUSCULAR | Status: AC | PRN
  Administered 2014-08-19: 12 mL

## 2014-08-19 NOTE — Progress Notes (Unsigned)
Patient ID: [name] [name], male   DOB: 1966/02/26, 48 y.o.   MRN: [id number] Drain injection shows no fistula.  Will start liquids for 48 hours than advance to soft diet if tolerated.  If soft diet tolerated,, will stop TPN.  Will plan on repeating drain study in one week and if still negative for fistula, will remove drain.

## 2014-08-20 ENCOUNTER — Ambulatory Visit (INDEPENDENT_AMBULATORY_CARE_PROVIDER_SITE_OTHER): Payer: BC Managed Care – PPO | Admitting: Internal Medicine

## 2014-08-20 ENCOUNTER — Encounter: Payer: Self-pay | Admitting: Internal Medicine

## 2014-08-20 VITALS — BP 114/76 | HR 70 | Wt 217.0 lb

## 2014-08-20 DIAGNOSIS — K651 Peritoneal abscess: Secondary | ICD-10-CM

## 2014-08-20 DIAGNOSIS — A498 Other bacterial infections of unspecified site: Secondary | ICD-10-CM

## 2014-08-20 DIAGNOSIS — IMO0001 Reserved for inherently not codable concepts without codable children: Secondary | ICD-10-CM

## 2014-08-20 DIAGNOSIS — Z1612 Extended spectrum beta lactamase (ESBL) resistance: Secondary | ICD-10-CM | POA: Diagnosis not present

## 2014-08-20 DIAGNOSIS — T814XXD Infection following a procedure, subsequent encounter: Secondary | ICD-10-CM

## 2014-08-20 NOTE — Progress Notes (Signed)
Rfv: hfu for esbl ecoli intra abd abscess Subjective:    Patient ID: [name] [name], male    DOB: 1965/11/30, 48 y.o.   MRN: [id number]  HPI 48yo M with hx of diverticulitis of left colon. He underwent lap subtotal colectomy on 9/22 that was complicated by intra-abdominal abscess due to ESBL ecoli, drained via IR with JP drain on 10/9. He has been maintained on TPN in addition to getting ertapenem to treat his abdominal infection. He was discharged on 11/4. He had repeat abd ct scan last week (not in the system), which he reports that infection is gone. He is instructed to start taking in solids over the next few days, and stop tpn on 11/112 so to see if everything remains intraluminal. He has a repeat scan scheduled for next week to assess how he is tolerating oral intake. His drain output is in 10-20mL per day, he can tolerate 3-364mL of flush without discomfort. picc line only 1 line working. Allergies  Allergen Reactions  . Celebrex [Celecoxib] Other (See Comments)    "Mom is allergic, so I do not take it"  . Sulfa Antibiotics Nausea And Vomiting  . Codeine Rash  . Dilaudid [Hydromorphone Hcl] Nausea And Vomiting and Anxiety   Current Outpatient Prescriptions on File Prior to Visit  Medication Sig Dispense Refill  . colchicine 0.6 MG tablet Take 0.6 mg by mouth 2 (two) times daily as needed (For gout.).     Marland Kitchen. diphenhydrAMINE (BENADRYL) 25 MG tablet Take 25 mg by mouth every 6 (six) hours as needed for allergies.     . ertapenem 1 g in sodium chloride 0.9 % 50 mL Inject 1 g into the vein daily. 30 vial 0  . losartan-hydrochlorothiazide (HYZAAR) 50-12.5 MG per tablet Take 0.5 tablets by mouth at bedtime.     . Probiotic Product (PROBIOTIC DAILY PO) Take 1 capsule by mouth at bedtime.    . traMADol (ULTRAM) 50 MG tablet Take 1 tablet (50 mg total) by mouth every 6 (six) hours as needed for moderate pain or severe pain. 40 tablet 0  . zolpidem (AMBIEN) 10 MG tablet Take 10 mg by mouth at  bedtime as needed for sleep.      No current facility-administered medications on file prior to visit.   Active Ambulatory Problems    Diagnosis Date Noted  . Diverticulitis of left colon s/p lap assisted subtotal colectomy 07/01/2014 04/09/2014  . Sarcoidosis   . Hypertension   . Postoperative intra-abdominal abscess - ESBL resistent Ecoli 07/19/2014  . Superficial venous thrombosis of arm 07/19/2014  . Pancreatitis, acute 07/19/2014  . Protein-calorie malnutrition, moderate 07/19/2014   Resolved Ambulatory Problems    Diagnosis Date Noted  . Left lower quadrant pain 08/22/2012   Past Medical History  Diagnosis Date  . Hyperlipemia   . Diverticulitis   . Allergy   . Hypercholesteremia 06/15/2012  . CVA (cerebral vascular accident)       Review of Systems No vomiting, nausea, diarrhea, yeast infection, rash, pain with picc line site, no abdominal discomfort.     Objective:   Physical Exam BP 114/76 mmHg  Pulse 70  Wt 217 lb (98.431 kg) Physical Exam  Constitutional: He is oriented to person, place, and time. He appears well-developed and well-nourished. No distress.  HENT:  Mouth/Throat: Oropharynx is clear and moist. No oropharyngeal exudate.  Cardiovascular: Normal rate, regular rhythm and normal heart sounds. Exam reveals no gallop and no friction rub.  No murmur heard.  Pulmonary/Chest: Effort normal and breath sounds normal. No respiratory distress. He has no wheezes.  Abdominal: Soft. Bowel sounds are normal. He exhibits no distension. There is no tenderness. Infraumbilical surgical scar. Left flank jp drain with serous fluid. Lymphadenopathy:  He has no cervical adenopathy.  Neurological: He is alert and oriented to person, place, and time.  Skin: Skin is warm and dry. No rash noted. No erythema. Right arm picc line is c/d/i small area of erythema at insertion site not tender not worsening Psychiatric: He has a normal mood and affect. His behavior is normal.     Labs: 10/9 cx ESBL ecoli S imipenem alone      Assessment & Plan:  - will plan to have ertapenem 1gm daily given thru his next abd CT which is scheduled for next week. If it looks like this repeat CT next week is still clear of intra-abdominal abscess, then can instruct home health to pull picc line and stop IV antibiotics  rtc prn

## 2014-08-22 ENCOUNTER — Telehealth: Payer: Self-pay | Admitting: *Deleted

## 2014-08-22 NOTE — Telephone Encounter (Signed)
[name] [name] with [facility] called for continuation order on IV antibiotics. Per Dr. Feliz BeamSnider's last office note, antibiotic should be extended for 1 week through 08/27/14. [facility] will send out enough antibiotic through 11/19. [name] [name]

## 2014-08-25 ENCOUNTER — Other Ambulatory Visit (INDEPENDENT_AMBULATORY_CARE_PROVIDER_SITE_OTHER): Payer: Self-pay | Admitting: General Surgery

## 2014-08-25 DIAGNOSIS — T814XXA Infection following a procedure, initial encounter: Principal | ICD-10-CM

## 2014-08-25 DIAGNOSIS — IMO0001 Reserved for inherently not codable concepts without codable children: Secondary | ICD-10-CM

## 2014-08-25 DIAGNOSIS — K651 Peritoneal abscess: Principal | ICD-10-CM

## 2014-08-25 NOTE — Progress Notes (Addendum)
Patient ID: [name] [name], male   DOB: 03/14/1966, 48 y.o.   MRN: [id number] Per Dr. Abbey Chattersosenbower, the patient will begin to take POs. He will need a repeat fistulagram to make sure that PO intake does not cause the fistula to reopen. If there is no fistula to bowel on this final injection, the drain can be pulled.

## 2014-08-29 ENCOUNTER — Ambulatory Visit (HOSPITAL_COMMUNITY): Admission: RE | Admit: 2014-08-29 | Payer: BC Managed Care – PPO | Source: Ambulatory Visit

## 2014-08-29 ENCOUNTER — Ambulatory Visit (HOSPITAL_COMMUNITY)
Admission: RE | Admit: 2014-08-29 | Discharge: 2014-08-29 | Disposition: A | Discharge status: Home / Self Care | Payer: BC Managed Care – PPO | Source: Ambulatory Visit | Attending: General Surgery | Admitting: General Surgery

## 2014-08-29 ENCOUNTER — Other Ambulatory Visit (INDEPENDENT_AMBULATORY_CARE_PROVIDER_SITE_OTHER): Payer: Self-pay | Admitting: General Surgery

## 2014-08-29 ENCOUNTER — Encounter (INDEPENDENT_AMBULATORY_CARE_PROVIDER_SITE_OTHER): Payer: Self-pay | Admitting: General Surgery

## 2014-08-29 DIAGNOSIS — IMO0001 Reserved for inherently not codable concepts without codable children: Secondary | ICD-10-CM

## 2014-08-29 DIAGNOSIS — K651 Peritoneal abscess: Secondary | ICD-10-CM

## 2014-08-29 DIAGNOSIS — T814XXA Infection following a procedure, initial encounter: Principal | ICD-10-CM

## 2014-08-29 DIAGNOSIS — K6819 Other retroperitoneal abscess: Secondary | ICD-10-CM | POA: Diagnosis not present

## 2014-08-29 MED ORDER — IOHEXOL 300 MG/ML  SOLN
50.0000 mL | Freq: Once | INTRAMUSCULAR | Status: AC | PRN
  Administered 2014-08-29: 5 mL

## 2014-08-29 NOTE — Procedures (Signed)
Procedure:  Left abdominal abscess drain injection and removal Findings:  No residual abscess cavity.  No fistula to bowel.  Drain removed. No complications.

## 2014-08-29 NOTE — Progress Notes (Signed)
Patient ID: [name] CampanileDonald S [name], male   DOB: [birth year], 48 y.o.   MRN: [id number] Fistula gram after he has been eating is negative for a fistula.  The drain was removed.  Will have HHRN stop his antibiotics and remove the PICC.  Follow up with me in 2-3 weeks.

## 2015-10-03 ENCOUNTER — Emergency Department (HOSPITAL_COMMUNITY)
Admission: EM | Admit: 2015-10-03 | Discharge: 2015-10-03 | Disposition: A | Discharge status: Home / Self Care | Payer: BLUE CROSS/BLUE SHIELD | Attending: Emergency Medicine | Admitting: Emergency Medicine

## 2015-10-03 ENCOUNTER — Emergency Department (HOSPITAL_COMMUNITY): Payer: BLUE CROSS/BLUE SHIELD

## 2015-10-03 ENCOUNTER — Encounter (HOSPITAL_COMMUNITY): Payer: Self-pay | Admitting: Emergency Medicine

## 2015-10-03 DIAGNOSIS — Z792 Long term (current) use of antibiotics: Secondary | ICD-10-CM | POA: Insufficient documentation

## 2015-10-03 DIAGNOSIS — Z862 Personal history of diseases of the blood and blood-forming organs and certain disorders involving the immune mechanism: Secondary | ICD-10-CM | POA: Diagnosis not present

## 2015-10-03 DIAGNOSIS — Z8719 Personal history of other diseases of the digestive system: Secondary | ICD-10-CM | POA: Insufficient documentation

## 2015-10-03 DIAGNOSIS — Z79899 Other long term (current) drug therapy: Secondary | ICD-10-CM | POA: Insufficient documentation

## 2015-10-03 DIAGNOSIS — I10 Essential (primary) hypertension: Secondary | ICD-10-CM | POA: Insufficient documentation

## 2015-10-03 DIAGNOSIS — R05 Cough: Secondary | ICD-10-CM | POA: Diagnosis present

## 2015-10-03 DIAGNOSIS — Z8673 Personal history of transient ischemic attack (TIA), and cerebral infarction without residual deficits: Secondary | ICD-10-CM | POA: Diagnosis not present

## 2015-10-03 DIAGNOSIS — Z8639 Personal history of other endocrine, nutritional and metabolic disease: Secondary | ICD-10-CM | POA: Insufficient documentation

## 2015-10-03 DIAGNOSIS — J159 Unspecified bacterial pneumonia: Secondary | ICD-10-CM | POA: Diagnosis not present

## 2015-10-03 DIAGNOSIS — J189 Pneumonia, unspecified organism: Secondary | ICD-10-CM

## 2015-10-03 LAB — COMPREHENSIVE METABOLIC PANEL
ALT: 31 U/L (ref 17–63)
AST: 32 U/L (ref 15–41)
Albumin: 4 g/dL (ref 3.5–5.0)
Alkaline Phosphatase: 70 U/L (ref 38–126)
Anion gap: 8 (ref 5–15)
BILIRUBIN TOTAL: 0.6 mg/dL (ref 0.3–1.2)
BUN: 16 mg/dL (ref 6–20)
CO2: 26 mmol/L (ref 22–32)
CREATININE: 1.48 mg/dL — AB (ref 0.61–1.24)
Calcium: 8.4 mg/dL — ABNORMAL LOW (ref 8.9–10.3)
Chloride: 100 mmol/L — ABNORMAL LOW (ref 101–111)
GFR calc Af Amer: >60 mL/min (ref >60)
GFR, EST NON AFRICAN AMERICAN: 55 mL/min — AB (ref >60)
Glucose, Bld: 113 mg/dL — ABNORMAL HIGH (ref 65–99)
POTASSIUM: 3.8 mmol/L (ref 3.5–5.1)
Sodium: 134 mmol/L — ABNORMAL LOW (ref 135–145)
TOTAL PROTEIN: 7.7 g/dL (ref 6.5–8.1)

## 2015-10-03 LAB — CBC WITH DIFFERENTIAL/PLATELET
BASOS ABS: 0 10*3/uL (ref 0.0–0.1)
BASOS PCT: 0 %
EOS PCT: 0 %
Eosinophils Absolute: 0 10*3/uL (ref 0.0–0.7)
HEMATOCRIT: 43.7 % (ref 39.0–52.0)
Hemoglobin: 14.5 g/dL (ref 13.0–17.0)
Lymphocytes Relative: 14 %
Lymphs Abs: 0.9 10*3/uL (ref 0.7–4.0)
MCH: 28.7 pg (ref 26.0–34.0)
MCHC: 33.2 g/dL (ref 30.0–36.0)
MCV: 86.4 fL (ref 78.0–100.0)
MONO ABS: 0.8 10*3/uL (ref 0.1–1.0)
MONOS PCT: 13 %
NEUTROS ABS: 4.5 10*3/uL (ref 1.7–7.7)
Neutrophils Relative %: 73 %
PLATELETS: 237 10*3/uL (ref 150–400)
RBC: 5.06 MIL/uL (ref 4.22–5.81)
RDW: 15.2 % (ref 11.5–15.5)
WBC: 6.2 10*3/uL (ref 4.0–10.5)

## 2015-10-03 MED ORDER — SODIUM CHLORIDE 0.9 % IV BOLUS (SEPSIS)
1000.0000 mL | Freq: Once | INTRAVENOUS | Status: AC
  Administered 2015-10-03: 1000 mL via INTRAVENOUS

## 2015-10-03 MED ORDER — BENZONATATE 100 MG PO CAPS: 100.0000 mg | ORAL_CAPSULE | Freq: Three times a day (TID) | ORAL | Status: DC | PRN

## 2015-10-03 MED ORDER — GUAIFENESIN 100 MG/5ML PO LIQD: 100.0000 mg | ORAL | Status: DC | PRN

## 2015-10-03 MED ORDER — ACETAMINOPHEN 500 MG PO TABS
1000.0000 mg | ORAL_TABLET | Freq: Once | ORAL | Status: AC
  Administered 2015-10-03: 1000 mg via ORAL
  Filled 2015-10-03: qty 2

## 2015-10-03 MED ORDER — LEVOFLOXACIN IN D5W 750 MG/150ML IV SOLN
750.0000 mg | Freq: Once | INTRAVENOUS | Status: AC
  Administered 2015-10-03: 750 mg via INTRAVENOUS
  Filled 2015-10-03: qty 150

## 2015-10-03 NOTE — Discharge Instructions (Signed)
Continue antibiotics as prescribed by your primary physician. Return immediately for worsening shortness of breath, cough, chest pain or for any concerns. You may take over-the-counter Tylenol and ibuprofen as needed for fever.  Community-Acquired Pneumonia, Adult Pneumonia is an infection of the lungs. There are different types of pneumonia. One type can develop while a person is in a hospital. A different type, called community-acquired pneumonia, develops in people who are not, or have not recently been, in the hospital or other health care facility.  CAUSES Pneumonia may be caused by bacteria, viruses, or funguses. Community-acquired pneumonia is often caused by Streptococcus pneumonia bacteria. These bacteria are often passed from one person to another by breathing in droplets from the cough or sneeze of an infected person. RISK FACTORS The condition is more likely to develop in:  People who havechronic diseases, such as chronic obstructive pulmonary disease (COPD), asthma, congestive heart failure, cystic fibrosis, diabetes, or kidney disease.  People who haveearly-stage or late-stage HIV.  People who havesickle cell disease.  People who havehad their spleen removed (splenectomy).  People who havepoor Administratordental hygiene.  People who havemedical conditions that increase the risk of breathing in (aspirating) secretions their own mouth and nose.   People who havea weakened immune system (immunocompromised).  People who smoke.  People whotravel to areas where pneumonia-causing germs commonly exist.  People whoare around animal habitats or animals that have pneumonia-causing germs, including birds, bats, rabbits, cats, and farm animals. SYMPTOMS Symptoms of this condition include:  Adry cough.  A wet (productive) cough.  Fever.  Sweating.  Chest pain, especially when breathing deeply or coughing.  Rapid breathing or difficulty breathing.  Shortness of  breath.  Shaking chills.  Fatigue.  Muscle aches. DIAGNOSIS Your health care provider will take a medical history and perform a physical exam. You may also have other tests, including:  Imaging studies of your chest, including X-rays.  Tests to check your blood oxygen level and other blood gases.  Other tests on blood, mucus (sputum), fluid around your lungs (pleural fluid), and urine. If your pneumonia is severe, other tests may be done to identify the specific cause of your illness. TREATMENT The type of treatment that you receive depends on many factors, such as the cause of your pneumonia, the medicines you take, and other medical conditions that you have. For most adults, treatment and recovery from pneumonia may occur at home. In some cases, treatment must happen in a hospital. Treatment may include:  Antibiotic medicines, if the pneumonia was caused by bacteria.  Antiviral medicines, if the pneumonia was caused by a virus.  Medicines that are given by mouth or through an IV tube.  Oxygen.  Respiratory therapy. Although rare, treating severe pneumonia may include:  Mechanical ventilation. This is done if you are not breathing well on your own and you cannot maintain a safe blood oxygen level.  Thoracentesis. This procedureremoves fluid around one lung or both lungs to help you breathe better. HOME CARE INSTRUCTIONS  Take over-the-counter and prescription medicines only as told by your health care provider.  Only takecough medicine if you are losing sleep. Understand that cough medicine can prevent your body's natural ability to remove mucus from your lungs.  If you were prescribed an antibiotic medicine, take it as told by your health care provider. Do not stop taking the antibiotic even if you start to feel better.  Sleep in a semi-upright position at night. Try sleeping in a reclining chair, or place a few  pillows under your head.  Do not use tobacco products,  including cigarettes, chewing tobacco, and e-cigarettes. If you need help quitting, ask your health care provider.  Drink enough water to keep your urine clear or pale yellow. This will help to thin out mucus secretions in your lungs. PREVENTION There are ways that you can decrease your risk of developing community-acquired pneumonia. Consider getting a pneumococcal vaccine if:  You are older than 47 years of age.  You are older than 47 years of age and are undergoing cancer treatment, have chronic lung disease, or have other medical conditions that affect your immune system. Ask your health care provider if this applies to you. There are different types and schedules of pneumococcal vaccines. Ask your health care provider which vaccination option is best for you. You may also prevent community-acquired pneumonia if you take these actions:  Get an influenza vaccine every year. Ask your health care provider which type of influenza vaccine is best for you.  Go to the dentist on a regular basis.  Wash your hands often. Use hand sanitizer if soap and water are not available. SEEK MEDICAL CARE IF:  You have a fever.  You are losing sleep because you cannot control your cough with cough medicine. SEEK IMMEDIATE MEDICAL CARE IF:  You have worsening shortness of breath.  You have increased chest pain.  Your sickness becomes worse, especially if you are an older adult or have a weakened immune system.  You cough up blood.   This information is not intended to replace advice given to you by your health care provider. Make sure you discuss any questions you have with your health care provider.   Document Released: 09/26/2005 Document Revised: 06/17/2015 Document Reviewed: 01/21/2015 Elsevier Interactive Patient Education [employer]

## 2015-10-03 NOTE — ED Provider Notes (Signed)
CSN: [id number]     Arrival date & time 10/03/15  0941 History   First MD Initiated Contact with Patient 10/03/15 [phone]     Chief Complaint  Patient presents with  . Cough     (Consider location/radiation/quality/duration/timing/severity/associated sxs/prior Treatment) HPI Patient with cough, fever and shortness of breath starting on Monday. Seen by primary doctor and started on prednisone, Augmentin and given an inhaler. Patient states he was feeling better until Thursday and Friday became acutely worse. His antibiotic was switched from Augmentin to Levaquin. He's taken one dose of this. Denies sinus pressure, chest pain, abdominal pain, neck pain or stiffness. Cough is productive of yellow sputum. He has mild sore throat. No new rashes. Past Medical History  Diagnosis Date  . Hypertension   . Sarcoidosis (HCC)   . Hyperlipemia   . Diverticulitis   . Allergy     SEASONAL  . Hypercholesteremia 06/15/2012  . 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 Vertebral artery stenting  2008  . Laparoscopic partial colectomy N/A 07/01/2014    Procedure: LAPAROSCOPIC ASSISTED  SUBTOTAL COLECTOMY INCIDENTAL APPENDECTOMY, PARTIAL OMENTECTOMY AND RIGID PROCTOSCOPY;  Surgeon: Avel Peace, MD;  Location: [facility] ORS;  Service: General;  Laterality: N/A;  . Appendectomy  07/01/2014  . Omentectomy  07/01/2014   Family History  Problem Relation Age of Onset  . Emphysema Paternal Grandfather   . Heart disease Paternal Grandfather   . Allergies Mother   . Hypertension Mother   . Skin cancer Father   . Colon cancer Neg Hx    Social History  Substance Use Topics  . Smoking status: Never Smoker   . Smokeless tobacco: Never Used  . Alcohol Use: Yes     Comment: seldom    Review of Systems  Constitutional: Positive for fever and fatigue.  HENT: Positive for sore throat. Negative for congestion, sinus pressure, trouble  swallowing and voice change.   Respiratory: Positive for cough and shortness of breath. Negative for chest tightness and wheezing.   Gastrointestinal: Negative for nausea, vomiting, abdominal pain, diarrhea and constipation.  Genitourinary: Negative for dysuria, frequency and flank pain.  Musculoskeletal: Negative for myalgias, back pain, neck pain and neck stiffness.  Skin: Negative for rash.  Neurological: Positive for headaches. Negative for dizziness, weakness, light-headedness and numbness.  All other systems reviewed and are negative.     Allergies  Celebrex; Sulfa antibiotics; Codeine; and Dilaudid  Home Medications   Prior to Admission medications   Medication Sig Start Date End Date Taking? Authorizing Provider  amoxicillin-clavulanate (AUGMENTIN) 875-125 MG tablet Take 1 tablet by mouth 2 (two) times daily. 09/28/15  Yes Historical Provider, MD  colchicine 0.6 MG tablet Take 0.6 mg by mouth 2 (two) times daily as needed (For gout.).  06/04/14  Yes Historical Provider, MD  diphenhydrAMINE (BENADRYL) 25 MG tablet Take 25 mg by mouth every 6 (six) hours as needed for allergies.    Yes Historical Provider, MD  levofloxacin (LEVAQUIN) 500 MG tablet Take 1 tablet by mouth daily. 10/02/15  Yes Historical Provider, MD  losartan-hydrochlorothiazide (HYZAAR) 50-12.5 MG per tablet Take 0.5 tablets by mouth at bedtime.  11/18/13  Yes Historical Provider, MD  predniSONE (STERAPRED UNI-PAK 21 TAB) 10 MG (21) TBPK tablet Take 10 mg by mouth as directed. Taper from 5 doses daily x 6 days 09/28/15  Yes Historical Provider, MD  zolpidem (AMBIEN) 10 MG tablet  Take 10 mg by mouth at bedtime as needed for sleep.    Yes Historical Provider, MD  benzonatate (TESSALON) 100 MG capsule Take 1 capsule (100 mg total) by mouth 3 (three) times daily as needed for cough. 10/03/15   Loren Racer, MD  guaiFENesin (ROBITUSSIN) 100 MG/5ML liquid Take 5-10 mLs (100-200 mg total) by mouth every 4 (four) hours as  needed for cough. 10/03/15   Loren Racer, MD   BP 101/64 mmHg  Pulse 89  Temp(Src) 99.1 F (37.3 C) (Oral)  Resp 18  Ht 6' (1.829 m)  Wt 250 lb (113.399 kg)  BMI 33.90 kg/m2  SpO2 96% Physical Exam  Constitutional: He is oriented to person, place, and time. He appears well-developed and well-nourished. No distress.  HENT:  Head: Normocephalic and atraumatic.  Mouth/Throat: Oropharynx is clear and moist. No oropharyngeal exudate.  Mildly erythematous oropharynx. No swelling. Uvula is midline. No sinus tenderness to percussion.  Eyes: EOM are normal. Pupils are equal, round, and reactive to light.  Neck: Normal range of motion. Neck supple.  No meningismus  Cardiovascular: Normal rate and regular rhythm.  Exam reveals no gallop and no friction rub.   No murmur heard. Pulmonary/Chest: Effort normal. No respiratory distress. He has no wheezes. He has rales. He exhibits no tenderness.  Bilateral rales at bases. No wheezing or prolonged expiratory phase  Abdominal: Soft. Bowel sounds are normal. He exhibits no distension and no mass. There is no tenderness. There is no rebound and no guarding.  Musculoskeletal: Normal range of motion. He exhibits no edema or tenderness.  No CVA tenderness bilaterally. No lower extremity swelling or pain. Distal pulses intact.  Lymphadenopathy:    He has no cervical adenopathy.  Neurological: He is alert and oriented to person, place, and time.  Moves all extremities without deficit. Sensation is fully intact. Ambulating without difficulty.  Skin: Skin is warm and dry. No rash noted. No erythema.  Psychiatric: He has a normal mood and affect. His behavior is normal.  Nursing note and vitals reviewed.   ED Course  Procedures (including critical care time) Labs Review Labs Reviewed  COMPREHENSIVE METABOLIC PANEL - Abnormal; Notable for the following:    Sodium 134 (*)    Chloride 100 (*)    Glucose, Bld 113 (*)    Creatinine, Ser 1.48 (*)     Calcium 8.4 (*)    GFR calc non Af Amer 55 (*)    All other components within normal limits  CBC WITH DIFFERENTIAL/PLATELET    Imaging Review Dg Chest 2 View  10/03/2015  CLINICAL DATA:  Worsening cough and shortness of breath over last week EXAM: CHEST  2 VIEW COMPARISON:  Mar 06, 2007 FINDINGS: The heart size and mediastinal contours are within normal limits. There is questioned minimal opacity of left lung base. There is no pulmonary edema or pleural effusion. The visualized skeletal structures are unremarkable. IMPRESSION: Question minimal opacity of left lung base, developing pneumonia is not excluded. Electronically Signed   By: Sherian Rein M.D.   On: 10/03/2015 10:39   I have personally reviewed and evaluated these images and lab results as part of my medical decision-making.   EKG Interpretation None      MDM   Final diagnoses:  CAP (community acquired pneumonia)    Patient is very well-appearing. He is in no respiratory distress.  Patient is to be very well-appearing. Vital signs stable. No dyspnea. Given first dose of antibiotic and pulse ox body fluids.  We'll discharge home to follow-up with his primary physician. He can return precautions and has voiced understanding.  Loren Raceravid Louiza Moor, MD 10/03/15 [phone]

## 2015-10-03 NOTE — ED Notes (Addendum)
Pt complaint of worsening cough and SOB post diagnosis of URI; taking Prednisone, Augmentin, and inhaler. Pt reports low grade fever all week; denies pain.  In office, pt negative for strep and flu.

## 2016-06-12 IMAGING — CT CT ABD-PELV W/ CM
1 of 3 series · 14 of 32 positions shown, 19 images · IV contrast (OMNIPAQUE)
Comparison: Multiple priors.  Most recent CT 07/16/2014.

CLINICAL DATA: Intraabdominal abscess s/p drain placement; ileus
subtotal colectomy 07/01/2014. Postoperative abscess adjacent to the
pancreas.

EXAM:
CT ABDOMEN AND PELVIS WITH CONTRAST
TECHNIQUE: Multidetector CT imaging of the abdomen and pelvis was performed
using the standard protocol following bolus administration of
intravenous contrast.
CONTRAST:  100mL OMNIPAQUE IOHEXOL 300 MG/ML  SOLN

[Series 2: rtn ap with st · axial · 0.78mm/px · z∈[-496,-50]mm · 14 of 101 slices shown, 19 images]
[im 6/101  soft-tissue]
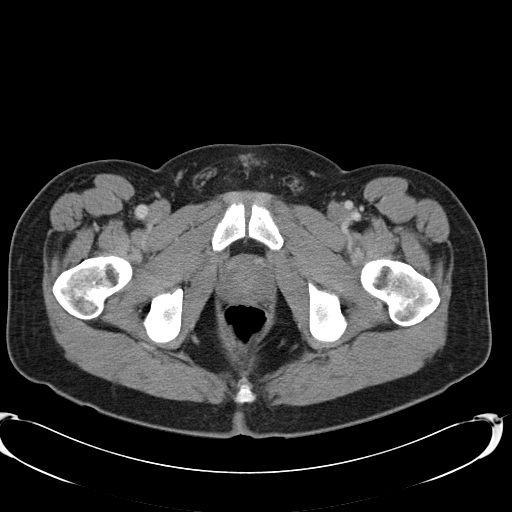
[im 6/101  bone]
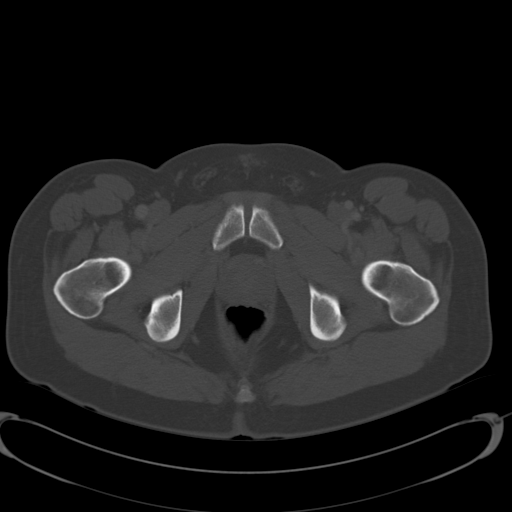
[im 12/101  soft-tissue]
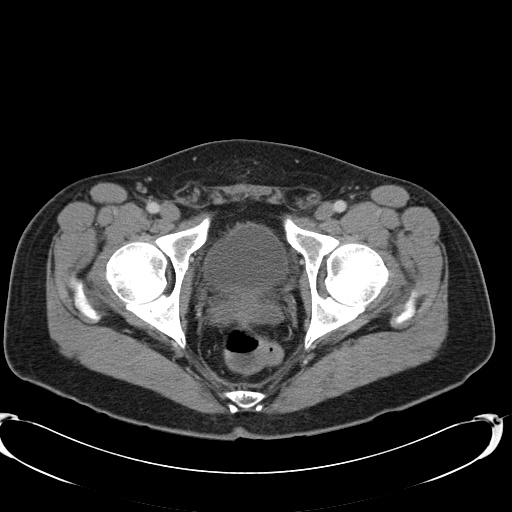
[im 23/101  soft-tissue]
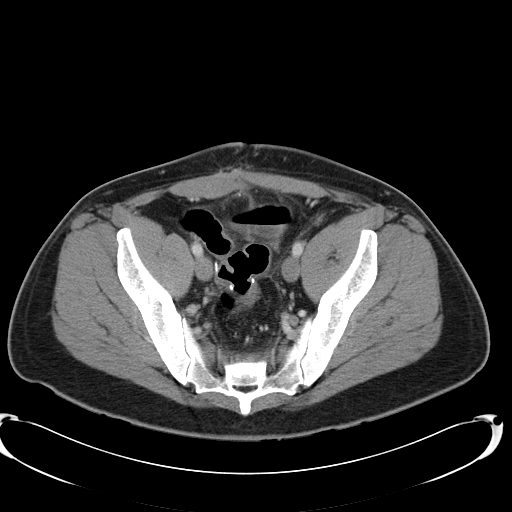
[im 28/101  soft-tissue]
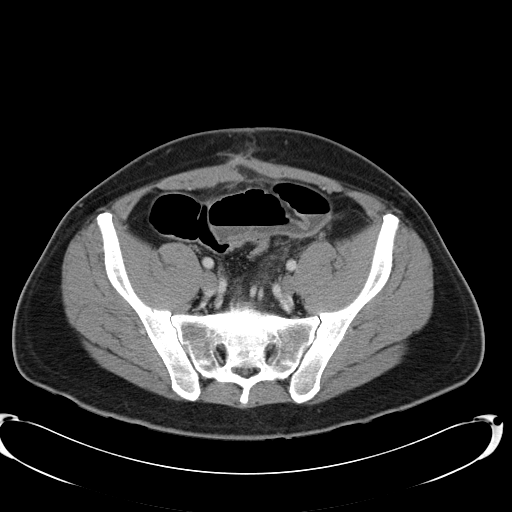
[im 34/101  soft-tissue]
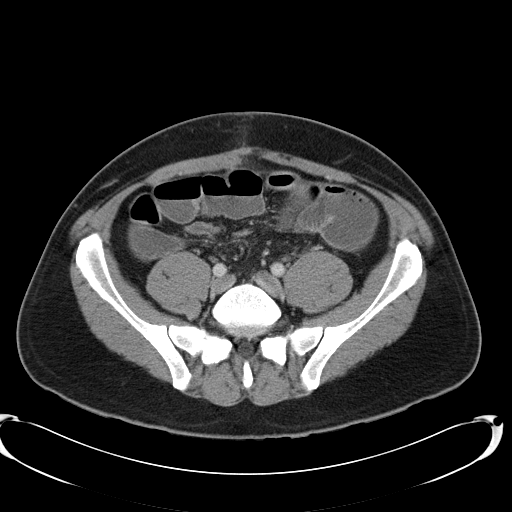
[im 45/101  soft-tissue]
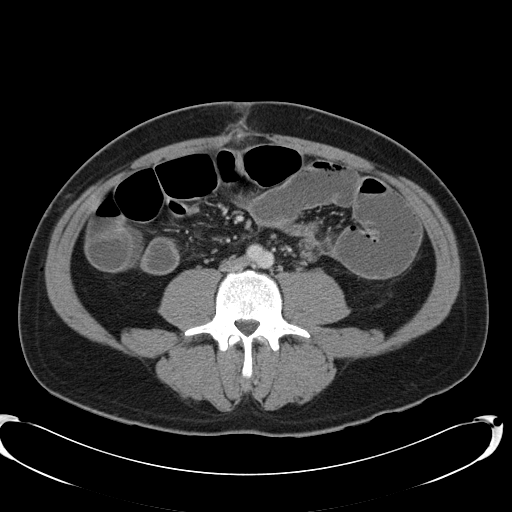
[im 51/101  soft-tissue]
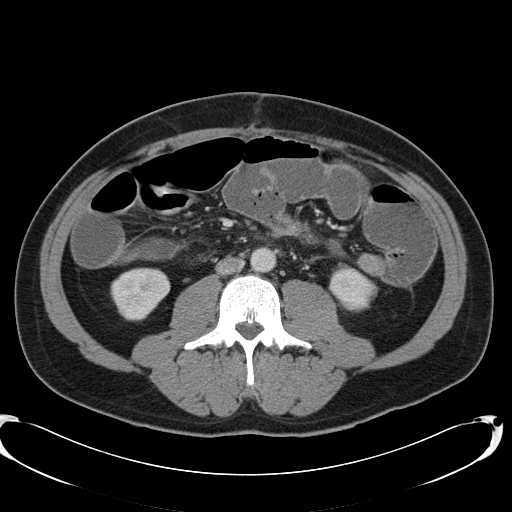
[im 56/101  soft-tissue]
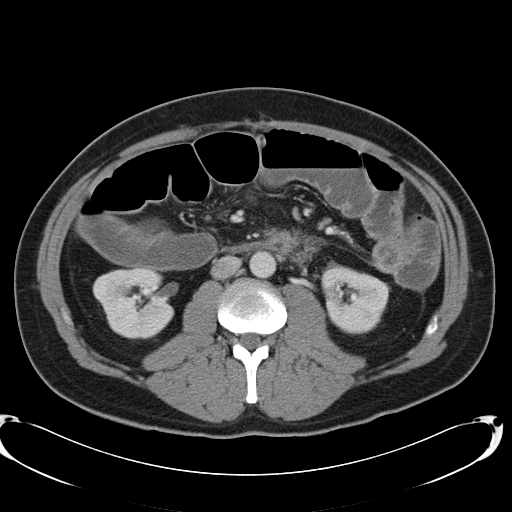
[im 67/101  soft-tissue]
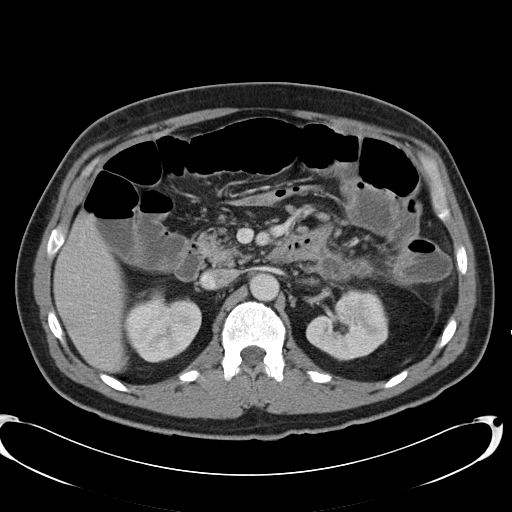
[im 67/101  bone]
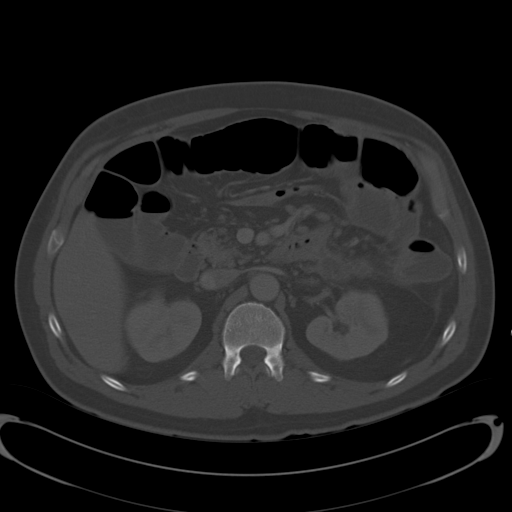
[im 73/101  soft-tissue]
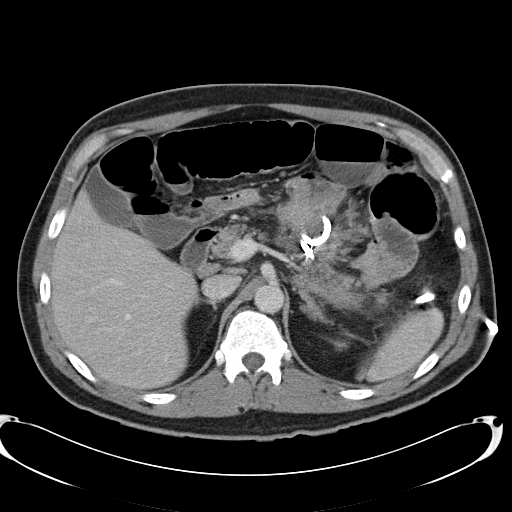
[im 78/101  soft-tissue]
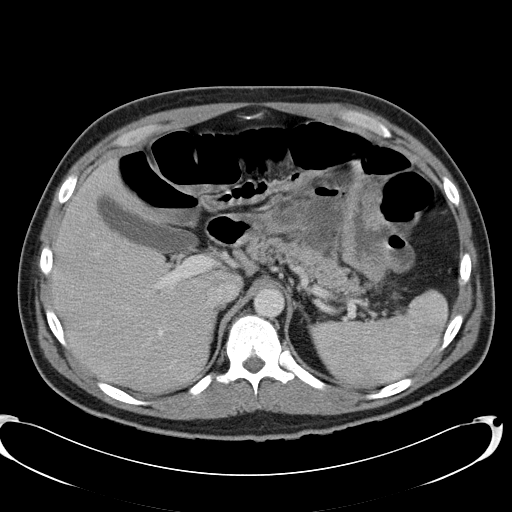
[im 78/101  lung]
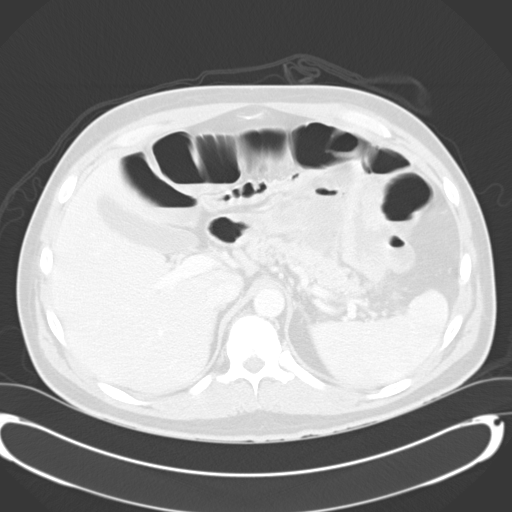
[im 84/101  lung]
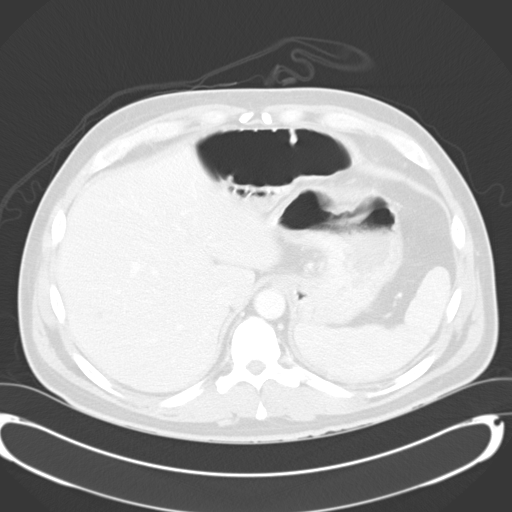
[im 89/101  soft-tissue]
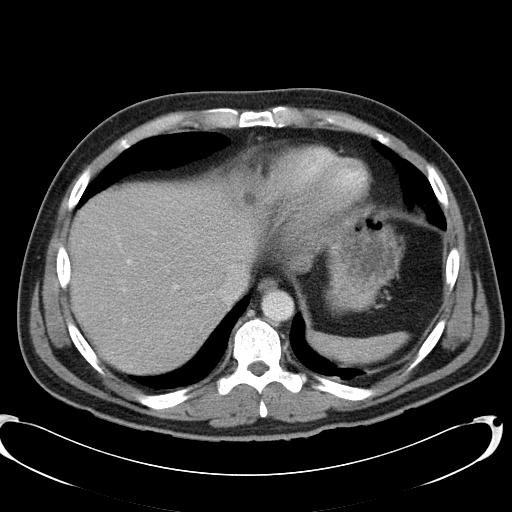
[im 89/101  lung]
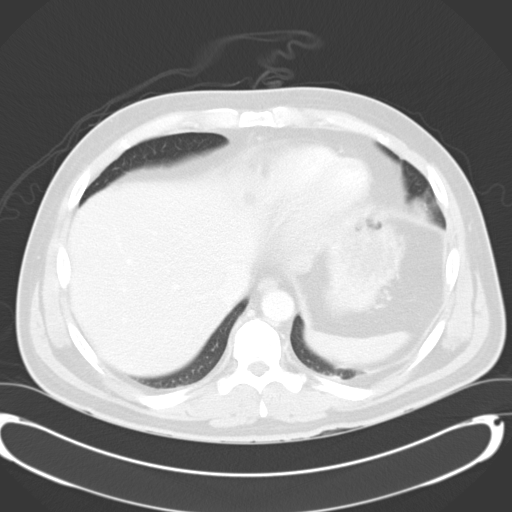
[im 95/101  soft-tissue]
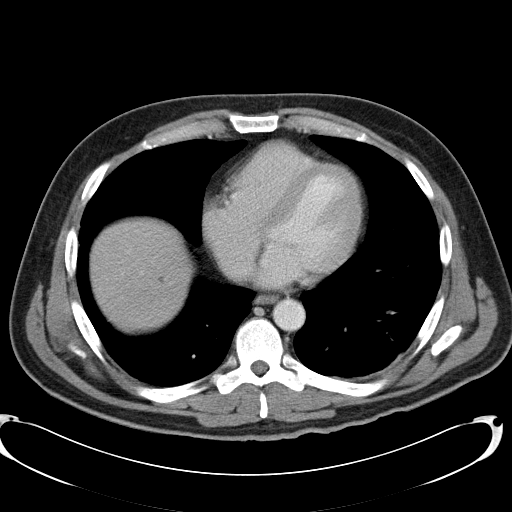
[im 95/101  lung]
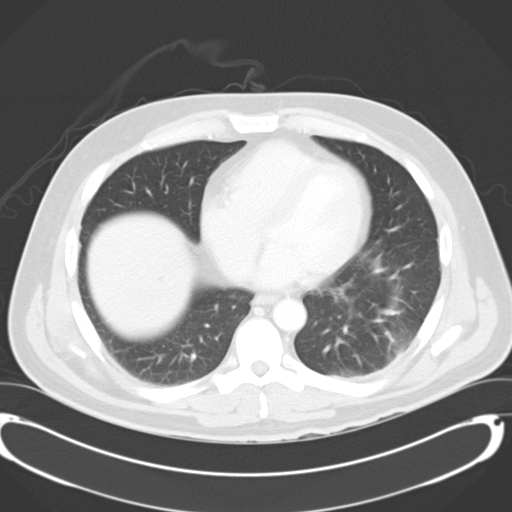

[14 of 32 positions shown; findings below may reference images not displayed]

FINDINGS: Atelectasis the lung bases.

Pigtail drainage catheter anterior to the pancreas in the abscess.
The drainage catheter is unchanged in position compared to prior.

The abscess is best visualized on coronal images and compared to the
prior exam from 07/16/2014. When direct comparison is made, the
abscess appears slightly smaller in the craniocaudal dimension, now
measuring 33 mm, previously 42 mm. On axial images, the abscess has
changed configuration slightly. Axial measurement today is 32 mm x
42 mm, previously 37 mm x 34 mm.

There is persistent distention of small bowel compatible with ileus.
IMPRESSION: 1. Unchanged pigtail drainage catheter in abscess anterior to the
body of the pancreas. The abscess has slightly decreased in size
compared 07/16/2014.
2. Persistent ileus, not changed compared to prior CT.

## 2016-06-13 IMAGING — XA IR CATHETER TUBE CHANGE
3 series · 9 of 9 positions shown · non-contrast
Comparison: none

CLINICAL DATA: 48-year-old male with a history of diverticulitis
complicated by intra-abdominal abscess ease and prolonged ileus. The
currently has a percutaneous 12 French drain in a abscess collection
just inferior to the stomach in the region of the foramen of
[place]. Clinically, the patient is improving. However on repeat CT
imaging there is a persistent collection just superior to the
pigtail of the drainage catheter. Drain injection with a possible
drain manipulation, exchange or up size under fluoroscopy is
warranted.
TECHNIQUE: Informed consent was obtained from the patient following explanation
of the procedure, risks, benefits and alternatives. The patient
understands, agrees and consents for the procedure. All questions
were addressed. A time out was performed.

[Series 1: fl - angio · 4 of 110 frames shown]
[frame 17/110]
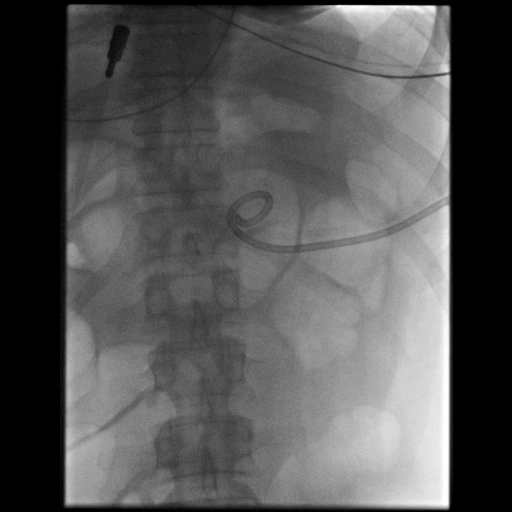
[frame 34/110]
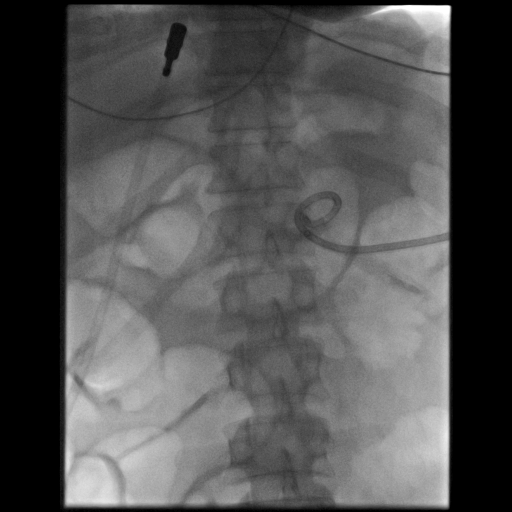
[frame 56/110]
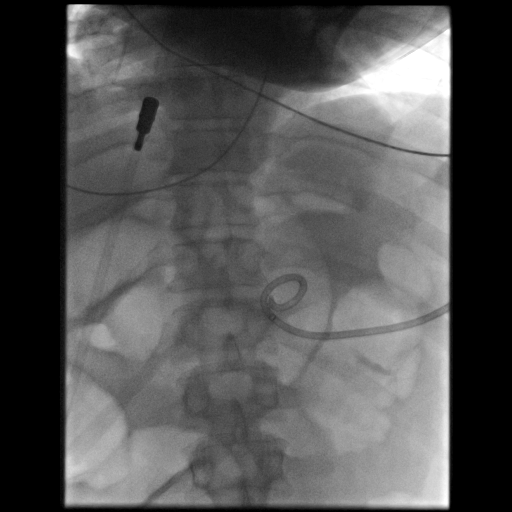
[frame 94/110]
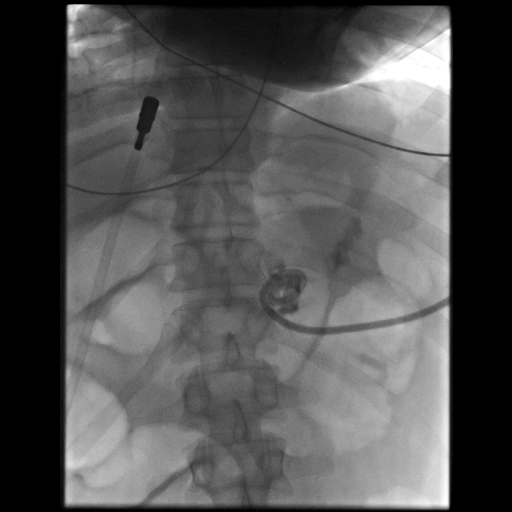

[Series 2: care single · 1 of 1 slices shown]
[im 1/1]
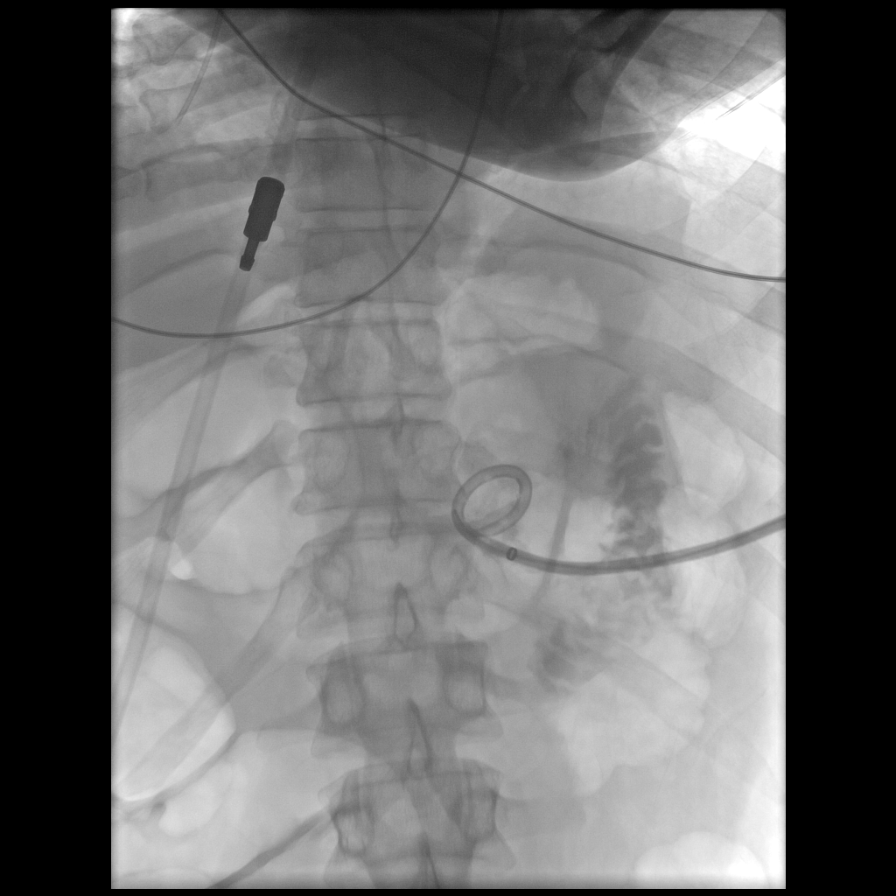

[Series 300: ir fistula / sinus track / abscess · 4 of 4 slices shown]
[im 1/4]
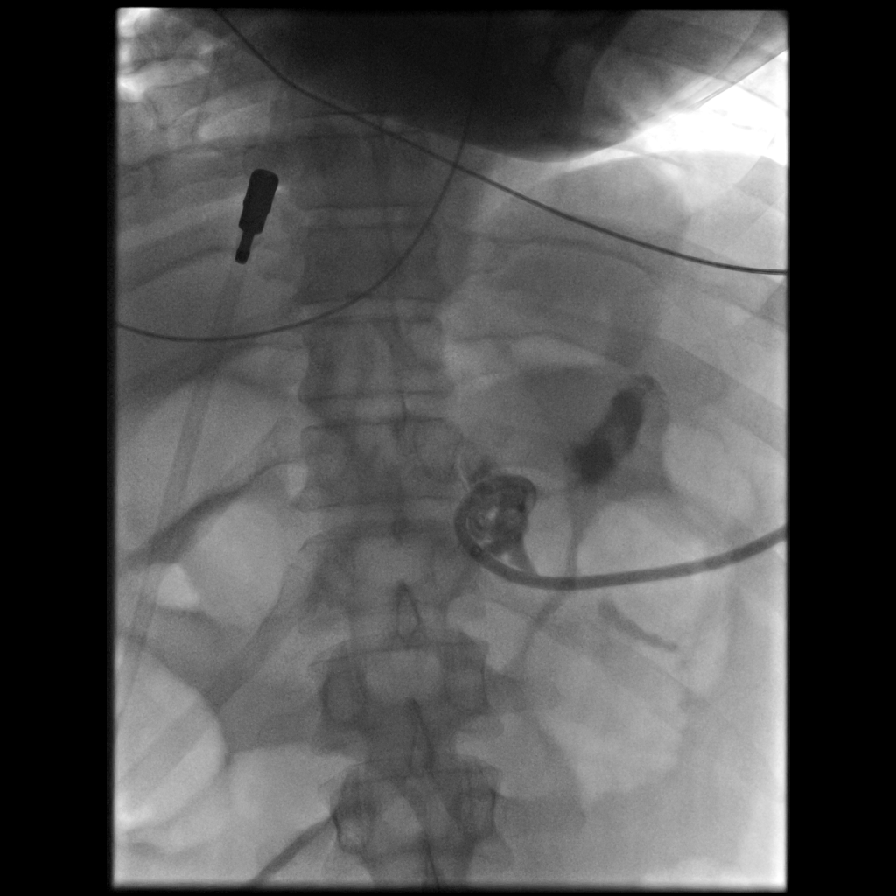
[im 2/4]
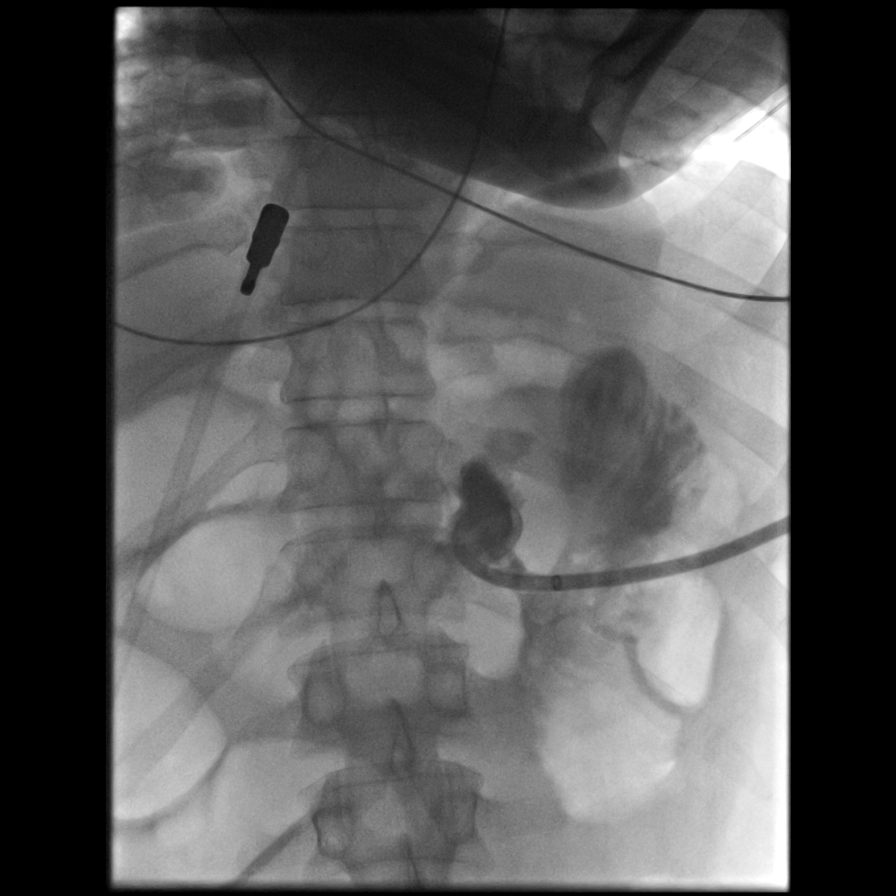
[im 3/4]
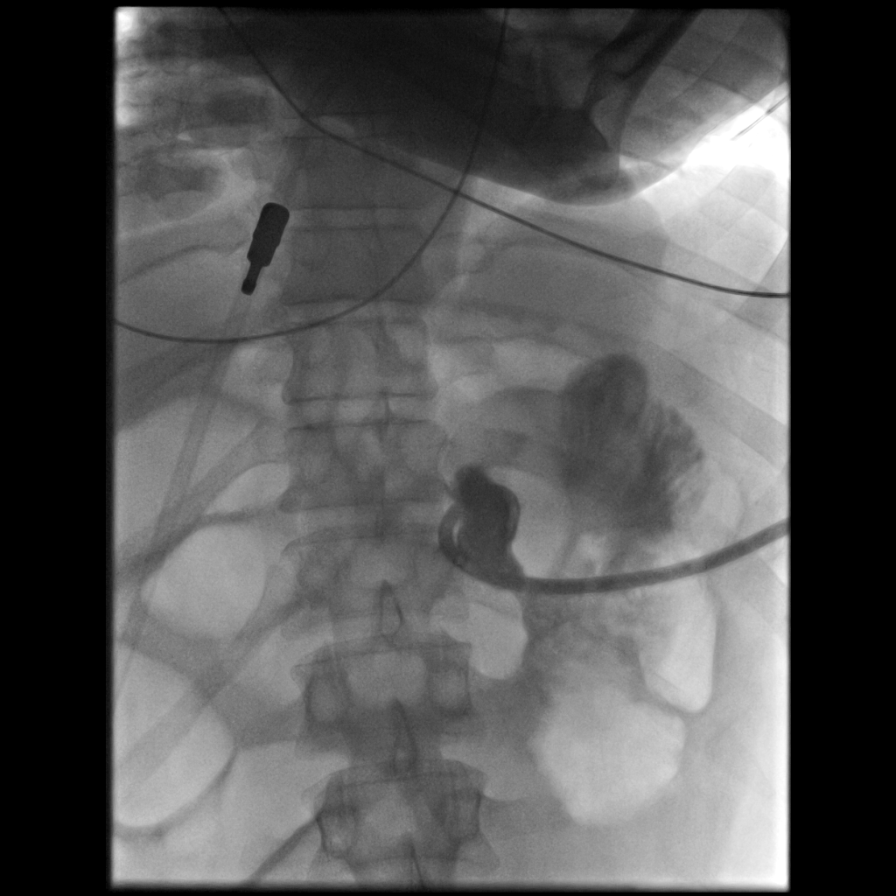
[im 4/4]
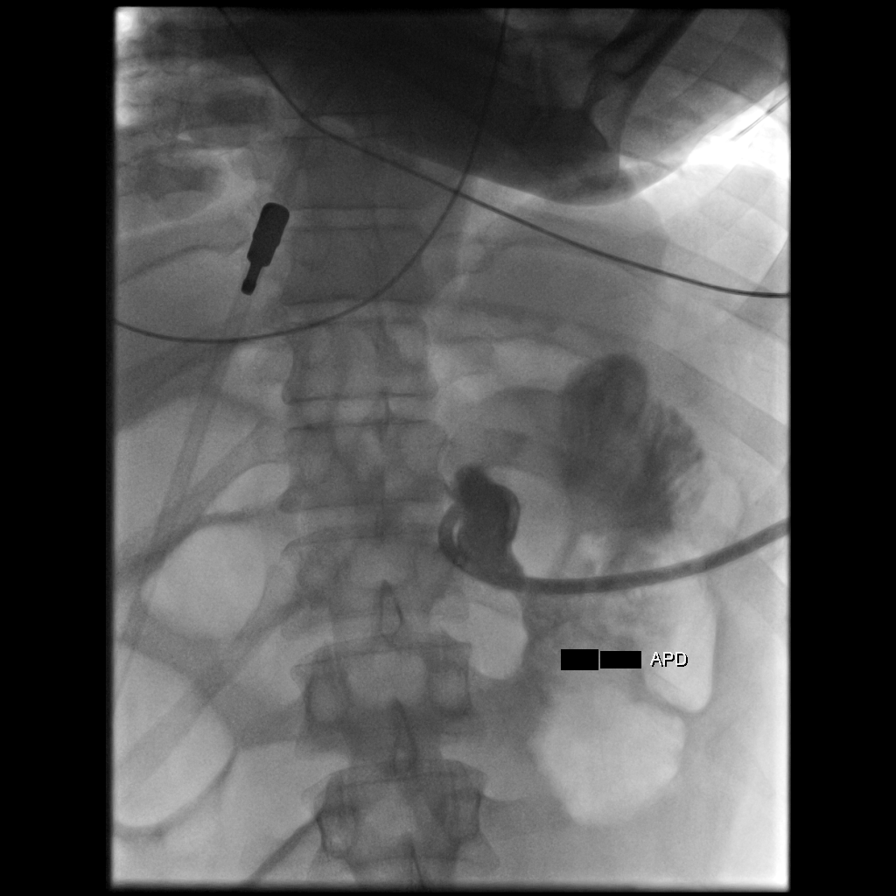

[9 of 9 positions shown; findings below may reference images not displayed]

EXAM:
SINUS TRACT INJECTION/FISTULOGRAM; IR CATHETER TUBE CHANGE

Date: 07/24/2014

PROCEDURE:
1. Drain injection under fluoroscopy
2. Manual disruption of loculations
3. Exchange and up size for a 14 Mayabel Tiger drainage
catheter

ANESTHESIA/SEDATION:
None required

FLUOROSCOPY TIME:  5 minutes 18 seconds

CONTRAST:  20 mL Omnipaque 300
Maximal barrier sterile technique utilized including caps, mask,
sterile gowns, sterile gloves, large sterile drape, hand hygiene,
and Betadine skin prep.

A gentle hand injection of contrast material was performed through
the existing catheter. The catheter was very difficult to inject
consistent with at least partial occlusion. The contrast material
opacifies a cavity about the locking loop of the catheter. There is
a fistulous connection with the adjacent small bowel in the left
upper quadrant. The catheter was cut and removed over a Bentson
wire. The catheter was inspected and found to be almost completely
clogged with thick tenacious debris.

A Kumpe catheter was advanced into the cavity over the Bentson wire.
Using a glidewire, the loculations within the abscess collection or
disrupted until contrast material is noted to extend more superior
into the undrained portion of the collection. The glidewire was
exchanged for a Bentson wire through an angled catheter. Fakamix Elfilus
14 French all-purpose drainage catheter was advanced over the wire
and formed within the collection. Approximately 20 mL of thick
purulent fluid and debris were aspirated. The catheter was gently
flush the connected to JP bulb suction. The catheter was secured to
the skin with 0 Prolene suture. The patient tolerated the procedure
well.

COMPLICATIONS:
None.
IMPRESSION: 1. Nearly completely occluded existing 12 French percutaneous drain
2. Fistulous connection between the residual abscess cavity and the
adjacent small bowel in the left upper quadrant.
3. Successful manual disruption of internal loculations resulting in
increased communication to the undrained component of the abscess
cavity.
4. Exchange and up-size for a new 14 French percutaneous drainage
catheter. The drain was connected to JP bulb suction.

PLAN:
1. Maintained 14 French drain to JP bulb suction and flush 3 times
daily.
2. Given the presence of adjacent small bowel fistula, this tube may
be in place for a prolonged course.
3. Continue NPO/bowel rest with parenteral nutrition.
4. Recommend tube injection under fluoroscopy in 2-4 weeks in
Interventional Radiology.

## 2017-05-29 ENCOUNTER — Encounter (HOSPITAL_COMMUNITY): Payer: Self-pay | Admitting: Emergency Medicine

## 2017-05-29 ENCOUNTER — Other Ambulatory Visit: Payer: Self-pay

## 2017-05-29 ENCOUNTER — Emergency Department (HOSPITAL_COMMUNITY): Payer: BLUE CROSS/BLUE SHIELD

## 2017-05-29 ENCOUNTER — Emergency Department (HOSPITAL_COMMUNITY)
Admission: EM | Admit: 2017-05-29 | Discharge: 2017-05-29 | Disposition: A | Discharge status: Home / Self Care | Payer: BLUE CROSS/BLUE SHIELD | Attending: Emergency Medicine | Admitting: Emergency Medicine

## 2017-05-29 ENCOUNTER — Ambulatory Visit (HOSPITAL_COMMUNITY)
Admission: EM | Admit: 2017-05-29 | Discharge: 2017-05-29 | Disposition: A | Discharge status: Home / Self Care | Payer: Commercial Managed Care - PPO | Attending: Family Medicine | Admitting: Family Medicine

## 2017-05-29 DIAGNOSIS — M25512 Pain in left shoulder: Secondary | ICD-10-CM | POA: Diagnosis not present

## 2017-05-29 DIAGNOSIS — I10 Essential (primary) hypertension: Secondary | ICD-10-CM | POA: Insufficient documentation

## 2017-05-29 DIAGNOSIS — Z79899 Other long term (current) drug therapy: Secondary | ICD-10-CM | POA: Insufficient documentation

## 2017-05-29 DIAGNOSIS — Z7982 Long term (current) use of aspirin: Secondary | ICD-10-CM | POA: Insufficient documentation

## 2017-05-29 LAB — CBC
HEMATOCRIT: 45 % (ref 39.0–52.0)
HEMOGLOBIN: 15.5 g/dL (ref 13.0–17.0)
MCH: 30.5 pg (ref 26.0–34.0)
MCHC: 34.4 g/dL (ref 30.0–36.0)
MCV: 88.6 fL (ref 78.0–100.0)
Platelets: 335 10*3/uL (ref 150–400)
RBC: 5.08 MIL/uL (ref 4.22–5.81)
RDW: 14.2 % (ref 11.5–15.5)
WBC: 10.7 10*3/uL — ABNORMAL HIGH (ref 4.0–10.5)

## 2017-05-29 LAB — BASIC METABOLIC PANEL
Anion gap: 8 (ref 5–15)
BUN: 15 mg/dL (ref 6–20)
CO2: 28 mmol/L (ref 22–32)
Calcium: 9.6 mg/dL (ref 8.9–10.3)
Chloride: 104 mmol/L (ref 101–111)
Creatinine, Ser: 1.22 mg/dL (ref 0.61–1.24)
GFR calc Af Amer: >60 mL/min (ref >60)
GFR calc non Af Amer: >60 mL/min (ref >60)
Glucose, Bld: 97 mg/dL (ref 65–99)
Potassium: 4 mmol/L (ref 3.5–5.1)
SODIUM: 140 mmol/L (ref 135–145)

## 2017-05-29 LAB — POCT I-STAT TROPONIN I
TROPONIN I, POC: 0 ng/mL (ref 0.00–0.08)
Troponin i, poc: 0 ng/mL (ref 0.00–0.08)

## 2017-05-29 MED ORDER — IBUPROFEN 200 MG PO TABS
600.0000 mg | ORAL_TABLET | Freq: Once | ORAL | Status: AC
  Administered 2017-05-29: 600 mg via ORAL
  Filled 2017-05-29: qty 3

## 2017-05-29 NOTE — ED Provider Notes (Signed)
[facility]    CSN: [id number] Arrival date & time: 05/29/17  1207     History   Chief Complaint Chief Complaint  Patient presents with  . Shoulder Pain    HPI [name] [name] is a 51 y.o. male.   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 He was in his normal state of health until 2:00 in the morning when he was awakened with a deep ache which he rates as 8 out of 10. The pain is currently 4/10.  Risk factors for heart disease include hypertension only.  Significant negatives: Negative first-degree family history of heart disease, diabetes, smoking, history of heart disease, and hyperlipidemia.  Other significant negatives: No diaphoresis, nausea, vomiting, lightheadedness, recent muscle strain or change in activity, unusual sleep position, chest pain, neck pain, abdominal pain.        Past Medical History:  Diagnosis Date  . Allergy    SEASONAL  . 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 Diverticulitis   . Hypercholesteremia 06/15/2012  . Hyperlipemia   . Hypertension   . Sarcoidosis     Patient Active Problem List   Diagnosis Date Noted  . Postoperative intra-abdominal abscess - ESBL resistent Ecoli 07/19/2014  . Superficial venous thrombosis of arm 07/19/2014  . Pancreatitis, acute 07/19/2014  . Protein-calorie malnutrition, moderate (HCC) 07/19/2014  . Sarcoidosis   . Hypertension   . Diverticulitis of left colon s/p lap assisted subtotal colectomy 07/01/2014 04/09/2014    Past Surgical History:  Procedure Laterality Date  . APPENDECTOMY  07/01/2014  . LAPAROSCOPIC PARTIAL COLECTOMY N/A 07/01/2014   Procedure: LAPAROSCOPIC ASSISTED  SUBTOTAL COLECTOMY INCIDENTAL APPENDECTOMY, PARTIAL OMENTECTOMY AND RIGID PROCTOSCOPY;  Surgeon: Avel Peace, MD;  Location: [facility];  Service: General;  Laterality: N/A;  . OMENTECTOMY  07/01/2014  . vertebral artery stenting  2008       Home  Medications    Prior to Admission medications   Medication Sig Start Date End Date Taking? Authorizing Provider  aspirin EC 81 MG tablet Take 81 mg by mouth daily.   Yes [provider]  losartan-hydrochlorothiazide (HYZAAR) 50-12.5 MG per tablet Take 0.5 tablets by mouth at bedtime.  11/18/13  Yes [provider]  colchicine 0.6 MG tablet Take 0.6 mg by mouth 2 (two) times daily as needed (For gout.).  06/04/14   [provider]  diphenhydrAMINE (BENADRYL) 25 MG tablet Take 25 mg by mouth every 6 (six) hours as needed for allergies.     [provider]  zolpidem (AMBIEN) 10 MG tablet Take 10 mg by mouth at bedtime as needed for sleep.     [provider]    Family History Family History  Problem Relation Age of Onset  . Allergies Mother   . Hypertension Mother   . Skin cancer Father   . Emphysema Paternal Grandfather   . Heart disease Paternal Grandfather   . Colon cancer Neg Hx     Social History Social History  Substance Use Topics  . Smoking status: Never Smoker  . Smokeless tobacco: Never Used  . Alcohol use Yes     Comment: seldom     Allergies   Celebrex [celecoxib]; Sulfa antibiotics; Codeine; and Dilaudid [hydromorphone hcl]   Review of Systems Review of Systems  Musculoskeletal: Positive for arthralgias.  All other systems reviewed and are negative.    Physical Exam Triage Vital Signs ED Triage Vitals  Enc Vitals Group     BP 05/29/17 1316 124/85     Pulse Rate 05/29/17 1316 (!) 53     Resp 05/29/17 1316 20     Temp 05/29/17 1316 98.1 F (36.7 C)     Temp Source 05/29/17 1316 Oral     SpO2 05/29/17 1316 99 %     Weight --      Height --      Head Circumference --      Peak Flow --      Pain Score 05/29/17 1312 5     Pain Loc --      Pain Edu? --      Excl. in GC? --    No data found.   Updated Vital Signs BP 124/85 (BP Location: Left Arm) Comment (BP Location): large cuff  Pulse (!) 53   Temp 98.1  F (36.7 C) (Oral)   Resp 20   SpO2 99%    Physical Exam  Constitutional: He is oriented to person, place, and time. He appears well-developed and well-nourished.  HENT:  Right Ear: External ear normal.  Left Ear: External ear normal.  Mouth/Throat: Oropharynx is clear and moist.  Eyes: Pupils are equal, round, and reactive to light. Conjunctivae and EOM are normal.  Neck: Normal range of motion. Neck supple.  Cardiovascular: Normal rate, regular rhythm and normal heart sounds.   Pulmonary/Chest: Effort normal and breath sounds normal.  Abdominal: Soft. There is no tenderness.  Musculoskeletal: Normal range of motion.  Nontender shoulder  Neurological: He is alert and oriented to person, place, and time.  Skin: Skin is warm and dry.  Nursing note and vitals reviewed.    UC Treatments / Results  Labs (all labs ordered are listed, but only abnormal results are displayed) Labs Reviewed - No data to display  EKG  EKG Interpretation None       Radiology No results found.  Procedures Procedures (including critical care time)  Medications Ordered in UC Medications - No data to display   Initial Impression / Assessment and Plan / UC Course  I have reviewed the triage vital signs and the nursing notes.  Pertinent labs & imaging results that were available during my care of the patient were reviewed by me and considered in my medical decision making (see chart for details).     Final Clinical Impressions(s) / UC Diagnoses   Final diagnoses:  Acute pain of left shoulder    New Prescriptions Current Discharge Medication List       Controlled Substance Prescriptions [place] Controlled Substance Registry consulted? Not Applicable   Elvina Sidle, MD 05/29/17 [phone]

## 2017-05-29 NOTE — ED Notes (Signed)
Pt states that he pain to his left  arm radiating down to hand. Pt describes the pain as aches.

## 2017-05-29 NOTE — Discharge Instructions (Addendum)
You have been seen today for shoulder pain. There were no acute abnormalities on the x-rays, including no sign of fracture or dislocation. Pain: Take 600 mg of ibuprofen every 6 hours or 440 mg (over the counter dose) to 500 mg (prescription dose) of naproxen every 12 hours or for the next 3 days. After this time, these medications may be used as needed for pain. Take these medications with food to avoid upset stomach. Choose only one of these medications, do not take them together.  Tylenol: Should you continue to have additional pain while taking the ibuprofen or naproxen, you may add in tylenol as needed. Your daily total maximum amount of tylenol from all sources should be limited to 4000mg /day for persons without liver problems, or 2000mg /day for those with liver problems. Ice: May apply ice to the area over the next 24 hours for 15 minutes at a time to reduce swelling. Exercises: Start by performing these exercises a few times a week, increasing the frequency until you are performing them twice daily.  Follow up: If symptoms are improving, you may follow up with your primary care provider for any continued management. If symptoms are not improving, you may follow up with the orthopedic specialist.

## 2017-05-29 NOTE — ED Provider Notes (Signed)
[facility] Provider Note   CSN: [id number] Arrival date & time: 05/29/17  1424     History   Chief Complaint Chief Complaint  Patient presents with  . Chest Pain    HPI [name] [name] is a 52 y.o. male.  HPI   [name] [name] is a 51 y.o. male, with a history of head injury, HTN, and hypercholesterolemia, presenting to the ED with left arm pain that woke the patient from a sleep this morning around 2:30 AM. Pain is aching, constant, 8/10, radiating down the arm. Nothing makes it better or worse. 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  Patient has not taken any medications for his pain. He states he can take ibuprofen, naproxen, and Tylenol without difficulty, despite his allergy list. Denies chest pain, shortness of breath, N/V, fever/chills, trauma/falls, dizziness, diaphoresis, or any other complaints.    Past Medical History:  Diagnosis Date  . Allergy    SEASONAL  . 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 Diverticulitis   . Hypercholesteremia 06/15/2012  . Hyperlipemia   . Hypertension   . Sarcoidosis     Patient Active Problem List   Diagnosis Date Noted  . Postoperative intra-abdominal abscess - ESBL resistent Ecoli 07/19/2014  . Superficial venous thrombosis of arm 07/19/2014  . Pancreatitis, acute 07/19/2014  . Protein-calorie malnutrition, moderate (HCC) 07/19/2014  . Sarcoidosis   . Hypertension   . Diverticulitis of left colon s/p lap assisted subtotal colectomy 07/01/2014 04/09/2014    Past Surgical History:  Procedure Laterality Date  . APPENDECTOMY  07/01/2014  . LAPAROSCOPIC PARTIAL COLECTOMY N/A 07/01/2014   Procedure: LAPAROSCOPIC ASSISTED  SUBTOTAL COLECTOMY INCIDENTAL APPENDECTOMY, PARTIAL OMENTECTOMY AND RIGID PROCTOSCOPY;  Surgeon: Avel Peace, MD;  Location: [facility];  Service: General;  Laterality: N/A;  . OMENTECTOMY  07/01/2014  . vertebral artery stenting  2008        Home Medications    Prior to Admission medications   Medication Sig Start Date End Date Taking? Authorizing Provider  aspirin EC 81 MG tablet Take 81 mg by mouth daily.   Yes [provider]  colchicine 0.6 MG tablet Take 0.6 mg by mouth 2 (two) times daily as needed (For gout.).  06/04/14  Yes [provider]  diphenhydrAMINE (BENADRYL) 25 MG tablet Take 25 mg by mouth every 6 (six) hours as needed for allergies.    Yes [provider]  losartan-hydrochlorothiazide (HYZAAR) 50-12.5 MG per tablet Take 0.5 tablets by mouth at bedtime.  11/18/13  Yes [provider]  zolpidem (AMBIEN) 10 MG tablet Take 5-10 mg by mouth at bedtime as needed for sleep.    Yes [provider]    Family History Family History  Problem Relation Age of Onset  . Allergies Mother   . Hypertension Mother   . Skin cancer Father   . Emphysema Paternal Grandfather   . Heart disease Paternal Grandfather   . Colon cancer Neg Hx     Social History Social History  Substance Use Topics  . Smoking status: Never Smoker  . Smokeless tobacco: Never Used  . Alcohol use Yes     Comment: seldom     Allergies   Celebrex [celecoxib]; Sulfa antibiotics; Codeine; and Dilaudid [hydromorphone hcl]   Review of Systems Review of Systems  Constitutional: Negative for chills, diaphoresis and fever.  Respiratory: Negative for shortness of breath.   Cardiovascular: Negative for  chest pain.  Gastrointestinal: Negative for abdominal pain, nausea and vomiting.  Musculoskeletal: Positive for arthralgias. Negative for back pain and joint swelling.  Neurological: Negative for dizziness, weakness and numbness.  All other systems reviewed and are negative.    Physical Exam Updated Vital Signs BP 116/84 (BP Location: Right Arm)   Pulse (!) 55   Temp 98.3 F (36.8 C) (Oral)   Resp 16   Ht 6' (1.829 m)   Wt 112 kg (247 lb)   SpO2 100%   BMI 33.50 kg/m   Physical Exam   Constitutional: He appears well-developed and well-nourished. No distress.  HENT:  Head: Normocephalic and atraumatic.  Eyes: Conjunctivae are normal.  Neck: Normal range of motion. Neck supple.  Cardiovascular: Normal rate, regular rhythm, normal heart sounds and intact distal pulses.   Pulmonary/Chest: Effort normal and breath sounds normal. No respiratory distress.  Abdominal: Soft. There is no tenderness. There is no guarding.  Musculoskeletal: He exhibits tenderness. He exhibits no edema.  Tenderness to the superior left shoulder and in the area of the left deltoid. Increased pain with abduction of left shoulder. Full passive and active ROM in left shoulder, elbow, and wrist. No erythema, swelling, or increased warmth.  Lymphadenopathy:    He has no cervical adenopathy.  Neurological: He is alert.  No noted sensory deficits in the bilateral upper extremities. Grip strength 5/5. Strength in the cardinal directions of the left shoulder, elbow, and wrist 5/5.  Skin: Skin is warm and dry. Capillary refill takes less than 2 seconds. He is not diaphoretic.  Psychiatric: He has a normal mood and affect. His behavior is normal.  Nursing note and vitals reviewed.    ED Treatments / Results  Labs (all labs ordered are listed, but only abnormal results are displayed) Labs Reviewed  CBC - Abnormal; Notable for the following:       Result Value   WBC 10.7 (*)    All other components within normal limits  BASIC METABOLIC PANEL  I-STAT TROPONIN, ED  POCT I-STAT TROPONIN I  I-STAT TROPONIN, ED  POCT I-STAT TROPONIN I    EKG  EKG Interpretation  Date/Time:  Monday May 29 2017 15:06:48 EDT Ventricular Rate:  58 PR Interval:    QRS Duration: 105 QT Interval:  413 QTC Calculation: 406 R Axis:   4 Text Interpretation:  Sinus rhythm Baseline wander in lead(s) II no acute ST/T changes no significant change compared to earlier in the day Confirmed by Pricilla Loveless [phone]) on  05/29/2017 7:33:44 PM       Radiology Dg Chest 2 View  Result Date: 05/29/2017 CLINICAL DATA:  Chest pain EXAM: CHEST  2 VIEW COMPARISON:  10/03/2015 FINDINGS: Normal heart size and mediastinal contours. No acute infiltrate or edema. No effusion or pneumothorax. No acute osseous findings. IMPRESSION: Negative chest. Electronically Signed   By: Marnee Spring M.D.   On: 05/29/2017 15:09   Dg Shoulder Left  Result Date: 05/29/2017 CLINICAL DATA:  Anterior shoulder pain without trauma. EXAM: LEFT SHOULDER - 2+ VIEW COMPARISON:  None. FINDINGS: Anterior IMPRESSION: Negative. Electronically Signed   By: Gerome Sam III M.D   On: 05/29/2017 20:58    Procedures Procedures (including critical care time)  Medications Ordered in ED Medications  ibuprofen (ADVIL,MOTRIN) tablet 600 mg (600 mg Oral Given 05/29/17 2024)     Initial Impression / Assessment and Plan / ED Course  I have reviewed the triage vital signs and the nursing notes.  Pertinent labs &  imaging results that were available during my care of the patient were reviewed by me and considered in my medical decision making (see chart for details).     Patient presents with left arm pain beginning this morning. He has no noted sensory or functional deficits. He does have pain reproducible on exam. Low suspicion for ACS. HEART score is 3, indicating low risk for a cardiac event. Wells criteria score is 0, indicating low risk for PE. Delta troponins negative. EKG without acute abnormalities. PCP versus orthopedic follow-up. Patient is nontoxic appearing, afebrile, not tachycardic, not tachypneic, not hypotensive, maintains SPO2 of 98-100% on room air, and is in no apparent distress. Patient has no signs of sepsis or other serious or life-threatening condition.   Vitals:   05/29/17 1913 05/29/17 1955 05/29/17 2118 05/29/17 2133  BP:  (!) 149/101 (!) 128/94   Pulse: (!) 55 (!) 54 (!) 54   Resp:  (!) 21 16   Temp: 98.3 F (36.8 C)  98.1 F (36.7 C) 98 F (36.7 C)   TempSrc: Oral Oral Oral   SpO2:  98% 99% 100%  Weight:      Height:           Final Clinical Impressions(s) / ED Diagnoses   Final diagnoses:  Acute pain of left shoulder    New Prescriptions Discharge Medication List as of 05/29/2017  9:08 PM       Anselm Pancoast, PA-C 05/29/17 2213    Pricilla Loveless, MD 05/31/17 2325

## 2017-05-29 NOTE — ED Triage Notes (Addendum)
Woke in early am with left shoulder aching and hurting.  Left shoulder pain was described as sharp, dull, aching.  Pain is improved now.  Denies diaphoresis, no sob.  Patient is right handed.  Denies chest pain

## 2017-05-29 NOTE — ED Triage Notes (Signed)
Pt states that he has had L shoulder pain since this morning. States he was at [facility] and was sent here for f/u blood work following having a normal EKG. Alert and oriented.

## 2017-05-29 NOTE — Discharge Instructions (Signed)
Your cardiogram is completely normal. Nevertheless, we don't have an explanation For the left shoulder pain and in situations like this it is better to be 100% sure that you don't have a heart related problem causing the left shoulder pain. Therefore I advise you to check in at the emergency department

## 2017-12-18 ENCOUNTER — Encounter: Payer: Self-pay | Admitting: Physician Assistant

## 2017-12-18 ENCOUNTER — Other Ambulatory Visit: Payer: Self-pay

## 2017-12-18 ENCOUNTER — Ambulatory Visit: Payer: Self-pay | Admitting: Physician Assistant

## 2017-12-18 VITALS — BP 128/82 | HR 62 | Temp 97.7°F | Resp 16 | Ht 72.0 in | Wt 267.0 lb

## 2017-12-18 DIAGNOSIS — M109 Gout, unspecified: Secondary | ICD-10-CM

## 2017-12-18 DIAGNOSIS — E669 Obesity, unspecified: Secondary | ICD-10-CM

## 2017-12-18 DIAGNOSIS — Z8679 Personal history of other diseases of the circulatory system: Secondary | ICD-10-CM

## 2017-12-18 LAB — COMPREHENSIVE METABOLIC PANEL
ALBUMIN: 4.3 g/dL (ref 3.5–5.2)
ALK PHOS: 65 U/L (ref 39–117)
ALT: 23 U/L (ref 0–53)
AST: 17 U/L (ref 0–37)
BUN: 15 mg/dL (ref 6–23)
CALCIUM: 9.7 mg/dL (ref 8.4–10.5)
CHLORIDE: 104 meq/L (ref 96–112)
CO2: 28 mEq/L (ref 19–32)
Creatinine, Ser: 1.12 mg/dL (ref 0.40–1.50)
GFR: 73.92 mL/min (ref >60.00)
Glucose, Bld: 110 mg/dL — ABNORMAL HIGH (ref 70–99)
Potassium: 4.1 mEq/L (ref 3.5–5.1)
Sodium: 139 mEq/L (ref 135–145)
TOTAL PROTEIN: 7.4 g/dL (ref 6.0–8.3)
Total Bilirubin: 0.5 mg/dL (ref 0.2–1.2)

## 2017-12-18 LAB — LIPID PANEL
CHOLESTEROL: 183 mg/dL (ref 0–200)
HDL: 54.1 mg/dL (ref >39.00)
LDL CALC: 114 mg/dL — AB (ref 0–99)
NonHDL: 128.59
TRIGLYCERIDES: 72 mg/dL (ref 0.0–149.0)
Total CHOL/HDL Ratio: 3
VLDL: 14.4 mg/dL (ref 0.0–40.0)

## 2017-12-18 LAB — URIC ACID: URIC ACID, SERUM: 7.2 mg/dL (ref 4.0–7.8)

## 2017-12-18 NOTE — Assessment & Plan Note (Signed)
BP normotensive today. Asymptomatic. Will keep an eye on this. Dietary and exercise recommendations reviewed. Follow-up scheduled. Will check lipids, CMP today.

## 2017-12-18 NOTE — Patient Instructions (Signed)
Please go to the lab today for blood work.  I will call you with your results. We will alter treatment regimen(s) if indicated by your results.   Please keep well-hydrated and follow a low-salt diet. I want you to increase your intake of fruits and vegetables, choosing different colors to get a variety of nutrients.   Portion meals as follows: 1/2 plate -- fruits and vegetables 1/4 plate -- lean protein (fish, chicken, lean pork) 1/4 plate -- healthy grains (brown rice, quinoa, couscous, etc).   Please increase aerobic exercise to goal of 150 minutes per week.  Can start with 30-45 minutes 2-3 days a week and go from there.  Follow-up here in office in 2 weeks for a BP check with the nurse. Follow-up with me in 4 weeks for reassessment.

## 2017-12-18 NOTE — Progress Notes (Signed)
Patient presents to clinic today to establish care.   Patient with history of hypertension, previously requiring pharmacotherapy to control. Initially lost weight and was able to be taken off of medication. Has noted > 10 pound weight gain over the past 6 months. Has switched jobs and notes a much more sedentary lifestyle. Is very mindful of what he is eating. Has cut out sodas and fast food. Would like to get back to prior weight of 245 and even improve from there. Would like to discuss exercise recommendations. Also notes history of hyperlipidemia but no current medication. States he has not had labs in > 1-1.5 years. Is fasting for labs today.   Past Medical History:  Diagnosis Date  . Allergy    SEASONAL  . 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 Diverticulitis   . Hypercholesteremia 06/15/2012  . Hyperlipemia   . Hypertension   . Sarcoidosis     Current Outpatient Medications on File Prior to Visit  Medication Sig Dispense Refill  . aspirin EC 81 MG tablet Take 81 mg by mouth daily.    . diphenhydrAMINE (BENADRYL) 25 MG tablet Take 25 mg by mouth every 6 (six) hours as needed.     No current facility-administered medications on file prior to visit.     Allergies  Allergen Reactions  . Celebrex [Celecoxib] Other (See Comments)    "Mom is allergic, so I do not take it"   . Codeine Rash  . Dilaudid [Hydromorphone Hcl] Nausea And Vomiting and Anxiety  . Sulfa Antibiotics Nausea And Vomiting    Unsure of reaction    Family History  Problem Relation Age of Onset  . Allergies Mother   . Hypertension Mother   . Arthritis Mother   . Skin cancer Father   . Diverticulitis Father   . Emphysema Paternal Grandfather   . Heart disease Paternal Grandfather   . Colon cancer Neg Hx     Social History   Socioeconomic History  . Marital status: Married    Spouse name: None  . Number of children: 2  . Years of education: None  . Highest  education level: None  Social Needs  . Financial resource strain: None  . Food insecurity - worry: None  . Food insecurity - inability: None  . Transportation needs - medical: None  . Transportation needs - non-medical: None  Occupational History  . Occupation: Curator  Tobacco Use  . Smoking status: Never Smoker  . Smokeless tobacco: Never Used  Substance and Sexual Activity  . Alcohol use: No    Frequency: Never  . Drug use: No  . Sexual activity: Yes    Comment: Married  Other Topics Concern  . None  Social History Narrative  . None   Review of Systems  Constitutional: Negative for fever and malaise/fatigue.  HENT: Negative for hearing loss.   Eyes: Negative for blurred vision.  Cardiovascular: Negative for chest pain and palpitations.  Musculoskeletal: Negative for joint pain.  Neurological: Negative for dizziness, loss of consciousness and headaches.  Psychiatric/Behavioral: Negative for depression, hallucinations, substance abuse and suicidal ideas. The patient is not nervous/anxious and does not have insomnia.     BP 128/82   Pulse 62   Temp 97.7 F (36.5 C) (Oral)   Resp 16   Ht 6' (1.829 m)   Wt 267 lb (121.1 kg)   SpO2 97%   BMI 36.21 kg/m   Physical Exam  Constitutional: He is oriented to person, place, and time and well-developed, well-nourished, and in no distress.  HENT:  Head: Normocephalic and atraumatic.  Eyes: Conjunctivae are normal.  Neck: Neck supple.  Cardiovascular: Normal rate, regular rhythm, normal heart sounds and intact distal pulses.  Pulmonary/Chest: Effort normal and breath sounds normal. No respiratory distress. He has no wheezes. He has no rales. He exhibits no tenderness.  Neurological: He is alert and oriented to person, place, and time.  Skin: Skin is warm and dry. No rash noted.  Psychiatric: Affect normal.  Vitals reviewed.  Assessment/Plan: Obesity (BMI 30-39.9) Reviewed dietary recommendations and exercise plan. Will  monitor. Follow-up in 4 weeks.   History of hypertension BP normotensive today. Asymptomatic. Will keep an eye on this. Dietary and exercise recommendations reviewed. Follow-up scheduled. Will check lipids, CMP today.    Piedad ClimesWilliam Cody Chardae Mulkern, PA-C

## 2017-12-18 NOTE — Assessment & Plan Note (Signed)
Reviewed dietary recommendations and exercise plan. Will monitor. Follow-up in 4 weeks.

## 2018-01-01 ENCOUNTER — Ambulatory Visit (INDEPENDENT_AMBULATORY_CARE_PROVIDER_SITE_OTHER): Payer: Self-pay | Admitting: Emergency Medicine

## 2018-01-01 ENCOUNTER — Encounter: Payer: Self-pay | Admitting: Emergency Medicine

## 2018-01-01 VITALS — BP 130/82

## 2018-01-01 DIAGNOSIS — Z8679 Personal history of other diseases of the circulatory system: Secondary | ICD-10-CM

## 2018-01-01 NOTE — Progress Notes (Signed)
Patient presented for nurse visit today for repeat BP check. 130/82 on recheck. Continue DASH diet and TLC. Follow-up in office in 2 weeks.

## 2018-01-15 ENCOUNTER — Ambulatory Visit: Payer: Self-pay | Admitting: Physician Assistant

## 2018-01-15 DIAGNOSIS — Z0289 Encounter for other administrative examinations: Secondary | ICD-10-CM

## 2018-03-29 ENCOUNTER — Ambulatory Visit: Payer: Self-pay | Admitting: Physician Assistant

## 2018-03-29 ENCOUNTER — Encounter: Payer: Self-pay | Admitting: Physician Assistant

## 2018-03-29 ENCOUNTER — Other Ambulatory Visit: Payer: Self-pay

## 2018-03-29 DIAGNOSIS — Z8679 Personal history of other diseases of the circulatory system: Secondary | ICD-10-CM

## 2018-03-29 NOTE — Patient Instructions (Signed)
BP looks great today. Keep up with diet and exercise. No evidence of hypertension. Will monitor at checkups and sick visits.   BP Readings from Last 3 Encounters:  03/29/18 118/88  01/01/18 130/82  12/18/17 128/82

## 2018-03-29 NOTE — Progress Notes (Signed)
Patient presents to clinic today for follow-up of BP. Patent with a history of hypertension. Has not been on medication in some time. He is following DASH diet as directed. Is not checking BP at home. Patient denies chest pain, palpitations, lightheadedness, dizziness, vision changes or frequent headaches.  BP Readings from Last 3 Encounters:  03/29/18 118/88  01/01/18 130/82  12/18/17 128/82    Past Medical History:  Diagnosis Date  . Allergy    SEASONAL  . 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 Diverticulitis   . Hypercholesteremia 06/15/2012  . Hyperlipemia   . Hypertension   . Sarcoidosis     Current Outpatient Medications on File Prior to Visit  Medication Sig Dispense Refill  . diphenhydrAMINE (BENADRYL) 25 MG tablet Take 25 mg by mouth every 6 (six) hours as needed.    Marland Kitchen aspirin EC 81 MG tablet Take 81 mg by mouth daily.     No current facility-administered medications on file prior to visit.     Allergies  Allergen Reactions  . Celebrex [Celecoxib] Other (See Comments)    "Mom is allergic, so I do not take it"   . Codeine Rash  . Dilaudid [Hydromorphone Hcl] Nausea And Vomiting and Anxiety  . Sulfa Antibiotics Nausea And Vomiting    Unsure of reaction    Family History  Problem Relation Age of Onset  . Allergies Mother   . Hypertension Mother   . Arthritis Mother   . Skin cancer Father   . Diverticulitis Father   . Emphysema Paternal Grandfather   . Heart disease Paternal Grandfather   . Colon cancer Neg Hx     Social History   Socioeconomic History  . Marital status: Married    Spouse name: Not on file  . Number of children: 2  . Years of education: Not on file  . Highest education level: Not on file  Occupational History  . Occupation: Curator  Social Needs  . Financial resource strain: Not on file  . Food insecurity:    Worry: Not on file    Inability: Not on file  . Transportation needs:    Medical: Not  on file    Non-medical: Not on file  Tobacco Use  . Smoking status: Never Smoker  . Smokeless tobacco: Never Used  Substance and Sexual Activity  . Alcohol use: No    Frequency: Never  . Drug use: No  . Sexual activity: Yes    Comment: Married  Lifestyle  . Physical activity:    Days per week: Not on file    Minutes per session: Not on file  . Stress: Not on file  Relationships  . Social connections:    Talks on phone: Not on file    Gets together: Not on file    Attends religious service: Not on file    Active member of club or organization: Not on file    Attends meetings of clubs or organizations: Not on file    Relationship status: Not on file  Other Topics Concern  . Not on file  Social History Narrative  . Not on file   Review of Systems - See HPI.  All other ROS are negative.  BP 118/88   Pulse (!) 55   Temp 98.3 F (36.8 C) (Oral)   Resp 16   Ht 6' (1.829 m)   Wt 266 lb 6.4 oz (120.8 kg)   SpO2 97%  BMI 36.13 kg/m   Physical Exam  Constitutional: He is oriented to person, place, and time. He appears well-developed and well-nourished.  HENT:  Head: Normocephalic and atraumatic.  Right Ear: External ear normal.  Neck: Neck supple.  Cardiovascular: Normal rate, regular rhythm and normal heart sounds.  Pulmonary/Chest: Effort normal and breath sounds normal. No stridor. No respiratory distress. He has no wheezes. He has no rales. He exhibits no tenderness.  Neurological: He is alert and oriented to person, place, and time. No cranial nerve deficit. Coordination normal.  Skin: Skin is warm and dry.  Vitals reviewed.  Assessment/Plan: History of hypertension BP remains normotensive. No sign of active HTN. Continue DASH diet. Exercise recommendations reviewed. Will check BP routinely at physicals.     Piedad ClimesWilliam Cody Thomas Mabry, PA-C

## 2018-03-29 NOTE — Assessment & Plan Note (Signed)
BP remains normotensive. No sign of active HTN. Continue DASH diet. Exercise recommendations reviewed. Will check BP routinely at physicals.

## 2018-04-04 ENCOUNTER — Telehealth: Payer: Self-pay | Admitting: Emergency Medicine

## 2018-04-04 NOTE — Telephone Encounter (Signed)
Called the number provided but no answer. I did not leave a message unsure of VM is correct.  Copied from CRM [id number]. Topic: General - Other >> Apr 03, 2018  3:55 PM Terisa Starraylor, Brittany L wrote: Reason for CRM: Casimiro NeedleMichael from [employer] [place] called and said that he is trying to get life insurance with there company. They know that his blood pressure is in good standing. The insurance company needs to have 3 separate readings with 5 minute intervals. He would be approved if they are 3 are in good standing readings. He has several questions for the clinical staff. Please call Casimiro NeedleMichael back @ [phone]

## 2018-04-05 NOTE — Telephone Encounter (Signed)
Called patient but VM was not set up.  Spoke with Casimiro NeedleMichael at [employer] [place] He states they need 3 separate readings with 5 min intervals in between to document patient is not hypertensive. Will schedule patient for a nurse visit to check blood pressures and document in chart to sent to insurance company.

## 2018-04-06 NOTE — Telephone Encounter (Signed)
Called patient again but VM not set up to leave a message.

## 2018-04-09 ENCOUNTER — Encounter: Payer: Self-pay | Admitting: Physician Assistant

## 2018-04-09 ENCOUNTER — Ambulatory Visit (INDEPENDENT_AMBULATORY_CARE_PROVIDER_SITE_OTHER): Payer: Self-pay

## 2018-04-09 VITALS — BP 118/82 | HR 71 | Temp 98.0°F | Resp 16 | Ht 72.0 in | Wt 267.0 lb

## 2018-04-09 DIAGNOSIS — Z0130 Encounter for examination of blood pressure without abnormal findings: Secondary | ICD-10-CM

## 2018-04-09 NOTE — Progress Notes (Signed)
BP looks great today. We have met insurance requirements to show that his BP remains in a normal range. Will print letter with results to fax to them.

## 2018-04-09 NOTE — Telephone Encounter (Signed)
Patient has nurse visit today to have BP checked and documented for the life insurance.

## 2019-02-26 ENCOUNTER — Encounter: Payer: Self-pay | Admitting: Physician Assistant

## 2019-02-26 ENCOUNTER — Other Ambulatory Visit: Payer: Self-pay

## 2019-02-26 ENCOUNTER — Ambulatory Visit (INDEPENDENT_AMBULATORY_CARE_PROVIDER_SITE_OTHER): Payer: Self-pay | Admitting: Physician Assistant

## 2019-02-26 VITALS — Temp 98.5°F | Ht 72.0 in | Wt 257.0 lb

## 2019-02-26 DIAGNOSIS — R0982 Postnasal drip: Secondary | ICD-10-CM

## 2019-02-26 NOTE — Progress Notes (Signed)
     Virtual Visit via Video   I connected with patient on 02/26/19 at  2:20 PM EDT by a video enabled telemedicine application and verified that I am speaking with the correct person using two identifiers.  Location patient: Home Location provider: [place] [facility] Persons participating in the virtual visit: Patient, Provider, CMA (Patina Moore)  I discussed the limitations of evaluation and management by telemedicine and the availability of in person appointments. The patient expressed understanding and agreed to proceed.  Subjective:   HPI:   Patient presents via [facility] today c/o 2 days of rhinorrhea, scratchy throat, nasal congestion and PND. Denies fever, chills, chest congestion or SOB. Notes sneezing as well. Denies recent travel, sick contact or exposure to COVID. Denies change to smell or taste. Denies GI symptoms. Notes his work wanted him to have assessment to make sure he is ok to continue working currently. States today he feels better without any sore throat, just the PND and runny nose. Has history of seasonal allergies.   ROS:   See pertinent positives and negatives per HPI.  Patient Active Problem List   Diagnosis Date Noted  . Obesity (BMI 30-39.9) 12/18/2017  . Sarcoidosis   . History of hypertension   . Diverticulitis of left colon s/p lap assisted subtotal colectomy 07/01/2014 04/09/2014    Social History   Tobacco Use  . Smoking status: Never Smoker  . Smokeless tobacco: Never Used  Substance Use Topics  . Alcohol use: No    Frequency: Never    Current Outpatient Medications:  .  aspirin EC 81 MG tablet, Take 81 mg by mouth daily., Disp: , Rfl:  .  diphenhydrAMINE (BENADRYL) 25 MG tablet, Take 25 mg by mouth every 6 (six) hours as needed., Disp: , Rfl:   Allergies  Allergen Reactions  . Celebrex [Celecoxib] Other (See Comments)    "Mom is allergic, so I do not take it"   . Codeine Rash  . Dilaudid [Hydromorphone Hcl] Nausea And  Vomiting and Anxiety  . Sulfa Antibiotics Nausea And Vomiting    Unsure of reaction    Objective:   Temp 98.5 F (36.9 C) (Temporal)   Ht 6' (1.829 m)   Wt 257 lb (116.6 kg)   BMI 34.86 kg/m   Patient is well-developed, well-nourished in no acute distress.  Resting comfortably at home.  Head is normocephalic, atraumatic.  No labored breathing.  Speech is clear and coherent with logical content.  Patient is alert and oriented at baseline.  No TTP of sinuses on exam with patient help.   Assessment and Plan:   1. Post-nasal drip Very mild symptoms. Allergic inflammation (most likely) versus viral etiology (less likely). Low concern for COVID and no alarm signs/symptoms presents. Has taken benadryl last night and feeling better so far today. Recommended daily non-drowsy antihistamine, saline nasal rinse and OTC Flonase. Close monitoring of symptoms. Ok to return tomorrow if still feeling better. Letter written for work.    Piedad Climes, PA-C 02/26/2019

## 2019-02-26 NOTE — Progress Notes (Signed)
I have discussed the procedure for the virtual visit with the patient who has given consent to proceed with assessment and treatment.   Tadarius Maland S Brydan Downard, CMA     

## 2019-04-29 ENCOUNTER — Encounter: Payer: Self-pay | Admitting: Physician Assistant

## 2019-04-29 ENCOUNTER — Encounter (HOSPITAL_COMMUNITY): Payer: Self-pay

## 2019-04-29 ENCOUNTER — Emergency Department (HOSPITAL_COMMUNITY)
Admission: EM | Admit: 2019-04-29 | Discharge: 2019-04-29 | Disposition: A | Discharge status: Home / Self Care | Payer: Self-pay | Attending: Emergency Medicine | Admitting: Emergency Medicine

## 2019-04-29 ENCOUNTER — Emergency Department (HOSPITAL_COMMUNITY): Payer: Self-pay

## 2019-04-29 ENCOUNTER — Other Ambulatory Visit: Payer: Self-pay

## 2019-04-29 ENCOUNTER — Ambulatory Visit: Payer: Self-pay | Admitting: Physician Assistant

## 2019-04-29 VITALS — BP 150/100 | HR 60 | Temp 99.3°F | Resp 16 | Ht 72.0 in | Wt 261.0 lb

## 2019-04-29 DIAGNOSIS — M25511 Pain in right shoulder: Secondary | ICD-10-CM

## 2019-04-29 DIAGNOSIS — Z8673 Personal history of transient ischemic attack (TIA), and cerebral infarction without residual deficits: Secondary | ICD-10-CM | POA: Insufficient documentation

## 2019-04-29 DIAGNOSIS — E785 Hyperlipidemia, unspecified: Secondary | ICD-10-CM

## 2019-04-29 DIAGNOSIS — Z7982 Long term (current) use of aspirin: Secondary | ICD-10-CM | POA: Insufficient documentation

## 2019-04-29 DIAGNOSIS — Z8679 Personal history of other diseases of the circulatory system: Secondary | ICD-10-CM

## 2019-04-29 DIAGNOSIS — G8929 Other chronic pain: Secondary | ICD-10-CM

## 2019-04-29 DIAGNOSIS — Z125 Encounter for screening for malignant neoplasm of prostate: Secondary | ICD-10-CM

## 2019-04-29 DIAGNOSIS — M5412 Radiculopathy, cervical region: Secondary | ICD-10-CM | POA: Insufficient documentation

## 2019-04-29 DIAGNOSIS — Z0000 Encounter for general adult medical examination without abnormal findings: Secondary | ICD-10-CM

## 2019-04-29 MED ORDER — HYDROCODONE-ACETAMINOPHEN 5-325 MG PO TABS: 1.0000 | ORAL_TABLET | Freq: Four times a day (QID) | ORAL | 0 refills | Status: DC | PRN

## 2019-04-29 MED ORDER — MELOXICAM 7.5 MG PO TABS: 7.5000 mg | ORAL_TABLET | Freq: Every day | ORAL | 0 refills | Status: DC

## 2019-04-29 MED ORDER — HYDROCODONE-ACETAMINOPHEN 5-325 MG PO TABS
1.0000 | ORAL_TABLET | Freq: Once | ORAL | Status: AC
  Administered 2019-04-29: 12:00:00 1 via ORAL
  Filled 2019-04-29: qty 1

## 2019-04-29 MED ORDER — LOSARTAN POTASSIUM 50 MG PO TABS: 50.0000 mg | ORAL_TABLET | Freq: Every day | ORAL | 3 refills | Status: DC

## 2019-04-29 MED ORDER — KETOROLAC TROMETHAMINE 30 MG/ML IJ SOLN
30.0000 mg | Freq: Once | INTRAMUSCULAR | Status: AC
  Administered 2019-04-29: 12:00:00 30 mg via INTRAMUSCULAR
  Filled 2019-04-29: qty 1

## 2019-04-29 NOTE — Progress Notes (Signed)
Patient presents to clinic today for annual exam.  Patient is fasting for labs.  Acute Concerns: Endorses 3-4 weeks of R shoulder pain that has been gradually worsening. Describes pain as aching and sharp. Notes occasional radiation into the RUE with intermittent tingling in the fingers of the hand. Noted significant worsening of pain this morning and as such presented to the [facility] ER early this morning. ER workup included examination, x-ray R Shoulder (unremarkable). Pain is currently an 8/10. Was given Meloxicam and hydrocodone which has picked up but has not taken yet. Was told he may need MRI if symptoms do not calm down as he is self pay.  Chronic Issues: Hyperlipidemia -- Previous borderline elevation, managed with diet and exercise. Due for recheck today.  Health Maintenance: Immunizations -- Declines Tetanus. Colonoscopy -- Patient self-pay which is creating barrier to adequate screening. Average risk and asymptomatic. He is working on Print production planner. Will reassess at that time.  Past Medical History:  Diagnosis Date  . Allergy    SEASONAL  . CVA (cerebral vascular accident) ([place])    due to head injury 2008  . Diverticulitis   . Hypercholesteremia 06/15/2012  . Hyperlipemia   . Sarcoidosis     Past Surgical History:  Procedure Laterality Date  . LAPAROSCOPIC PARTIAL COLECTOMY N/A 07/01/2014   Procedure: LAPAROSCOPIC ASSISTED  SUBTOTAL COLECTOMY INCIDENTAL APPENDECTOMY, PARTIAL OMENTECTOMY AND RIGID PROCTOSCOPY;  Surgeon: Jackolyn Confer, MD;  Location: [facility] ORS;  Service: General;  Laterality: N/A;  . OMENTECTOMY  07/01/2014  . vertebral artery stenting  2008    Current Outpatient Medications on File Prior to Visit  Medication Sig Dispense Refill  . aspirin EC 81 MG tablet Take 81 mg by mouth daily.    . diphenhydrAMINE (BENADRYL) 25 MG tablet Take 25 mg by mouth every 6 (six) hours as needed.     No current facility-administered medications on file prior to visit.     Allergies  Allergen Reactions  . Celebrex [Celecoxib] Other (See Comments)    "Mom is allergic, so I do not take it"   . Codeine Rash  . Dilaudid [Hydromorphone Hcl] Nausea And Vomiting and Anxiety  . Sulfa Antibiotics Nausea And Vomiting    Unsure of reaction    Family History  Problem Relation Age of Onset  . Allergies Mother   . Hypertension Mother   . Arthritis Mother   . Skin cancer Father   . Diverticulitis Father   . Emphysema Paternal Grandfather   . Heart disease Paternal Grandfather   . Colon cancer Neg Hx     Social History   Socioeconomic History  . Marital status: Married    Spouse name: Not on file  . Number of children: 2  . Years of education: Not on file  . Highest education level: Not on file  Occupational History  . Occupation: Dealer  Social Needs  . Financial resource strain: Not on file  . Food insecurity    Worry: Not on file    Inability: Not on file  . Transportation needs    Medical: Not on file    Non-medical: Not on file  Tobacco Use  . Smoking status: Never Smoker  . Smokeless tobacco: Never Used  Substance and Sexual Activity  . Alcohol use: No    Frequency: Never  . Drug use: No  . Sexual activity: Yes    Comment: Married  Lifestyle  . Physical activity    Days per week: Not on  file    Minutes per session: Not on file  . Stress: Not on file  Relationships  . Social Musicianconnections    Talks on phone: Not on file    Gets together: Not on file    Attends religious service: Not on file    Active member of club or organization: Not on file    Attends meetings of clubs or organizations: Not on file    Relationship status: Not on file  . Intimate partner violence    Fear of current or ex partner: Not on file    Emotionally abused: Not on file    Physically abused: Not on file    Forced sexual activity: Not on file  Other Topics Concern  . Not on file  Social History Narrative  . Not on file   Review of Systems   Constitutional: Negative for fever, malaise/fatigue and weight loss.  HENT: Negative for hearing loss.   Eyes: Negative for blurred vision and double vision.  Respiratory: Negative for cough, shortness of breath and wheezing.   Cardiovascular: Negative for chest pain and palpitations.  Gastrointestinal: Negative for abdominal pain, heartburn, nausea and vomiting.  Genitourinary: Negative for dysuria, frequency, hematuria and urgency.  Musculoskeletal: Positive for back pain, joint pain and neck pain. Negative for falls.  Neurological: Negative for dizziness.  Endo/Heme/Allergies: Positive for environmental allergies.  Psychiatric/Behavioral: Negative for depression. The patient is not nervous/anxious and does not have insomnia.    BP (!) 150/100   Pulse 60   Temp 99.3 F (37.4 C) (Skin)   Resp 16   Ht 6' (1.829 m)   Wt 261 lb (118.4 kg)   SpO2 98%   BMI 35.40 kg/m   Physical Exam  Assessment/Plan: 1. Visit for preventive health examination Depression screen negative. Health Maintenance reviewed. Preventive schedule discussed and handout given in AVS. Will obtain fasting labs today.  - CBC with Differential/Platelet - Comprehensive metabolic panel - Hemoglobin A1c - Lipid panel  2. Prostate cancer screening The natural history of prostate cancer and ongoing controversy regarding screening and potential treatment outcomes of prostate cancer has been discussed with the patient. The meaning of a false positive PSA and a false negative PSA has been discussed. He indicates understanding of the limitations of this screening test and wishes to proceed with screening PSA testing.  - PSA  3. History of hypertension With elevation today. Unclear how much of this is just baseline HTN that has recurred versus related to level of pain. Will restart losartan 50 mg once daily for now. TLC recommendations reviewed. Close follow-up scheduled.  - losartan (COZAAR) 50 MG tablet; Take 1  tablet (50 mg total) by mouth daily.  Dispense: 30 tablet; Refill: 3  4. Hyperlipidemia, unspecified hyperlipidemia type Repeat fasting labs today. Dietary and exercise recommendations reviewed with patient.   5. Chronic right shoulder pain > 1 month. X-rays unremarkable. Agree with cervical radiculopathy. No alarm signs present. Encouraged him to start treatment as recommended by ER provider.  If not improving will send to Sports Medicine for MSK US (due to cost of MRI -- patient self pay). Strict ER precautions reviewed with patient.    Piedad ClimesWilliam Cody Deborah Dondero, PA-C

## 2019-04-29 NOTE — Patient Instructions (Signed)
Please keep hydrated. Take the BP medication as directed. Limit salt intake (follow the dietary recommendations below).   Take the pain medication and anti-inflammatory medications as directed by the ER provider. No heavy lifting or overexertion.  We will follow-up via phone on Wednesday to reassess things.   ER for any acute worsening of symptoms.    DASH Eating Plan DASH stands for "Dietary Approaches to Stop Hypertension." The DASH eating plan is a healthy eating plan that has been shown to reduce high blood pressure (hypertension). It may also reduce your risk for type 2 diabetes, heart disease, and stroke. The DASH eating plan may also help with weight loss. What are tips for following this plan?  General guidelines  Avoid eating more than 2,300 mg (milligrams) of salt (sodium) a day. If you have hypertension, you may need to reduce your sodium intake to 1,500 mg a day.  Limit alcohol intake to no more than 1 drink a day for nonpregnant women and 2 drinks a day for men. One drink equals 12 oz of beer, 5 oz of wine, or 1 oz of hard liquor.  Work with your health care provider to maintain a healthy body weight or to lose weight. Ask what an ideal weight is for you.  Get at least 30 minutes of exercise that causes your heart to beat faster (aerobic exercise) most days of the week. Activities may include walking, swimming, or biking.  Work with your health care provider or diet and nutrition specialist (dietitian) to adjust your eating plan to your individual calorie needs. Reading food labels   Check food labels for the amount of sodium per serving. Choose foods with less than 5 percent of the Daily Value of sodium. Generally, foods with less than 300 mg of sodium per serving fit into this eating plan.  To find whole grains, look for the word "whole" as the first word in the ingredient list. Shopping  Buy products labeled as "low-sodium" or "no salt added."  Buy fresh foods.  Avoid canned foods and premade or frozen meals. Cooking  Avoid adding salt when cooking. Use salt-free seasonings or herbs instead of table salt or sea salt. Check with your health care provider or pharmacist before using salt substitutes.  Do not fry foods. Cook foods using healthy methods such as baking, boiling, grilling, and broiling instead.  Cook with heart-healthy oils, such as olive, canola, soybean, or sunflower oil. Meal planning  Eat a balanced diet that includes: ? 5 or more servings of fruits and vegetables each day. At each meal, try to fill half of your plate with fruits and vegetables. ? Up to 6-8 servings of whole grains each day. ? Less than 6 oz of lean meat, poultry, or fish each day. A 3-oz serving of meat is about the same size as a deck of cards. One egg equals 1 oz. ? 2 servings of low-fat dairy each day. ? A serving of nuts, seeds, or beans 5 times each week. ? Heart-healthy fats. Healthy fats called Omega-3 fatty acids are found in foods such as flaxseeds and coldwater fish, like sardines, salmon, and mackerel.  Limit how much you eat of the following: ? Canned or prepackaged foods. ? Food that is high in trans fat, such as fried foods. ? Food that is high in saturated fat, such as fatty meat. ? Sweets, desserts, sugary drinks, and other foods with added sugar. ? Full-fat dairy products.  Do not salt foods before eating.  Try to eat at least 2 vegetarian meals each week.  Eat more home-cooked food and less restaurant, buffet, and fast food.  When eating at a restaurant, ask that your food be prepared with less salt or no salt, if possible. What foods are recommended? The items listed may not be a complete list. Talk with your dietitian about what dietary choices are best for you. Grains Whole-grain or whole-wheat bread. Whole-grain or whole-wheat pasta. Brown rice. Orpah Cobb. Bulgur. Whole-grain and low-sodium cereals. Pita bread. Low-fat, low-sodium  crackers. Whole-wheat flour tortillas. Vegetables Fresh or frozen vegetables (raw, steamed, roasted, or grilled). Low-sodium or reduced-sodium tomato and vegetable juice. Low-sodium or reduced-sodium tomato sauce and tomato paste. Low-sodium or reduced-sodium canned vegetables. Fruits All fresh, dried, or frozen fruit. Canned fruit in natural juice (without added sugar). Meat and other protein foods Skinless chicken or [place]. Ground chicken or [place]. Pork with fat trimmed off. Fish and seafood. Egg whites. Dried beans, peas, or lentils. Unsalted nuts, nut butters, and seeds. Unsalted canned beans. Lean cuts of beef with fat trimmed off. Low-sodium, lean deli meat. Dairy Low-fat (1%) or fat-free (skim) milk. Fat-free, low-fat, or reduced-fat cheeses. Nonfat, low-sodium ricotta or cottage cheese. Low-fat or nonfat yogurt. Low-fat, low-sodium cheese. Fats and oils Soft margarine without trans fats. Vegetable oil. Low-fat, reduced-fat, or light mayonnaise and salad dressings (reduced-sodium). Canola, safflower, olive, soybean, and sunflower oils. Avocado. Seasoning and other foods Herbs. Spices. Seasoning mixes without salt. Unsalted popcorn and pretzels. Fat-free sweets. What foods are not recommended? The items listed may not be a complete list. Talk with your dietitian about what dietary choices are best for you. Grains Baked goods made with fat, such as croissants, muffins, or some breads. Dry pasta or rice meal packs. Vegetables Creamed or fried vegetables. Vegetables in a cheese sauce. Regular canned vegetables (not low-sodium or reduced-sodium). Regular canned tomato sauce and paste (not low-sodium or reduced-sodium). Regular tomato and vegetable juice (not low-sodium or reduced-sodium). Rosita Fire. Olives. Fruits Canned fruit in a light or heavy syrup. Fried fruit. Fruit in cream or butter sauce. Meat and other protein foods Fatty cuts of meat. Ribs. Fried meat. Tomasa Blase. Sausage. Bologna and  other processed lunch meats. Salami. Fatback. Hotdogs. Bratwurst. Salted nuts and seeds. Canned beans with added salt. Canned or smoked fish. Whole eggs or egg yolks. Chicken or [place] with skin. Dairy Whole or 2% milk, cream, and half-and-half. Whole or full-fat cream cheese. Whole-fat or sweetened yogurt. Full-fat cheese. Nondairy creamers. Whipped toppings. Processed cheese and cheese spreads. Fats and oils Butter. Stick margarine. Lard. Shortening. Ghee. Bacon fat. Tropical oils, such as coconut, palm kernel, or palm oil. Seasoning and other foods Salted popcorn and pretzels. Onion salt, garlic salt, seasoned salt, table salt, and sea salt. Worcestershire sauce. Tartar sauce. Barbecue sauce. Teriyaki sauce. Soy sauce, including reduced-sodium. Steak sauce. Canned and packaged gravies. Fish sauce. Oyster sauce. Cocktail sauce. Horseradish that you find on the shelf. Ketchup. Mustard. Meat flavorings and tenderizers. Bouillon cubes. Hot sauce and Tabasco sauce. Premade or packaged marinades. Premade or packaged taco seasonings. Relishes. Regular salad dressings. Where to find more information:  National Heart, Lung, and Blood Institute: PopSteam.is  American Heart Association: www.heart.org Summary  The DASH eating plan is a healthy eating plan that has been shown to reduce high blood pressure (hypertension). It may also reduce your risk for type 2 diabetes, heart disease, and stroke.  With the DASH eating plan, you should limit salt (sodium) intake to 2,300 mg a day. If  you have hypertension, you may need to reduce your sodium intake to 1,500 mg a day.  When on the DASH eating plan, aim to eat more fresh fruits and vegetables, whole grains, lean proteins, low-fat dairy, and heart-healthy fats.  Work with your health care provider or diet and nutrition specialist (dietitian) to adjust your eating plan to your individual calorie needs. This information is not intended to replace advice  given to you by your health care provider. Make sure you discuss any questions you have with your health care provider. Document Released: 09/15/2011 Document Revised: 09/08/2017 Document Reviewed: 09/19/2016 Elsevier Patient Education  2020 [employer].

## 2019-04-29 NOTE — Discharge Instructions (Signed)
Take Mobic once daily for your pain.  For severe, breakthrough pain, take 1-2 Vicodin every 6 hours.  Do not drive or operate machinery while taking this medication.  Use ice 3-4 times daily alternating 20 minutes on, 20 minutes off.  Please follow-up with your doctor today and discuss possible imaging.  You may end up needing to see an orthopedic doctor or neurosurgeon depending upon whether your nerve impingement is coming from your shoulder or neck.  Please return the emergency department you develop any weakness of your arm, dropping things, complete numbness, or any other concerning symptoms.  Do not drink alcohol, drive, operate machinery or participate in any other potentially dangerous activities while taking opiate pain medication as it may make you sleepy. Do not take this medication with any other sedating medications, either prescription or over-the-counter. If you were prescribed Percocet or Vicodin, do not take these with acetaminophen (Tylenol) as it is already contained within these medications and overdose of Tylenol is dangerous.   This medication is an opiate (or narcotic) pain medication and can be habit forming.  Use it as little as possible to achieve adequate pain control.  Do not use or use it with extreme caution if you have a history of opiate abuse or dependence. This medication is intended for your use only - do not give any to anyone else and keep it in a secure place where nobody else, especially children, have access to it. It will also cause or worsen constipation, so you may want to consider taking an over-the-counter stool softener while you are taking this medication.

## 2019-04-29 NOTE — ED Triage Notes (Signed)
Pt states pain down right arm and shoulder. Pt has hx of shoulder pain . Pt describes numbness and tingling that has increased. Pt c/o pinched nerve. Pt states he has been unable to sleep.

## 2019-04-29 NOTE — ED Provider Notes (Signed)
[facility] DEPT Provider Note   CSN: [id number] Arrival date & time: 04/29/19  3267     History   Chief Complaint Chief Complaint  Patient presents with  . Arm Pain    HPI [name] [name] is a 53 y.o. 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  Patient has had numbness and tingling worse in his thumb and first finger.  He denies any neck pain.  He denies any notable weakness or dropping things.  It has been worse while he is sleeping.  He reports he can only really sleep with his arm above his head comfortably.  He works as a Dealer.  He is right-handed.  He has been trying ibuprofen without relief.  Patient reports he received a cortisone injection about 7 or 8 years ago to his shoulder, but this feels different.     HPI  Past Medical History:  Diagnosis Date  . Allergy    SEASONAL  . CVA (cerebral vascular accident) ([place])    due to head injury 2008  . Diverticulitis   . Hypercholesteremia 06/15/2012  . Hyperlipemia   . Sarcoidosis     Patient Active Problem List   Diagnosis Date Noted  . Obesity (BMI 30-39.9) 12/18/2017  . Sarcoidosis   . History of hypertension   . Diverticulitis of left colon s/p lap assisted subtotal colectomy 07/01/2014 04/09/2014    Past Surgical History:  Procedure Laterality Date  . LAPAROSCOPIC PARTIAL COLECTOMY N/A 07/01/2014   Procedure: LAPAROSCOPIC ASSISTED  SUBTOTAL COLECTOMY INCIDENTAL APPENDECTOMY, PARTIAL OMENTECTOMY AND RIGID PROCTOSCOPY;  Surgeon: Jackolyn Confer, MD;  Location: [facility];  Service: General;  Laterality: N/A;  . OMENTECTOMY  07/01/2014  . vertebral artery stenting  2008        Home Medications    Prior to Admission medications   Medication Sig Start Date End Date Taking? Authorizing Provider  aspirin EC 81 MG tablet Take 81 mg by mouth daily.    [provider]   diphenhydrAMINE (BENADRYL) 25 MG tablet Take 25 mg by mouth every 6 (six) hours as needed.    [provider]  HYDROcodone-acetaminophen (NORCO/VICODIN) 5-325 MG tablet Take 1-2 tablets by mouth every 6 (six) hours as needed for severe pain. 04/29/19   Odie Edmonds, Bea Graff, PA-C  meloxicam (MOBIC) 7.5 MG tablet Take 1 tablet (7.5 mg total) by mouth daily. 04/29/19   Frederica Kuster, PA-C    Family History Family History  Problem Relation Age of Onset  . Allergies Mother   . Hypertension Mother   . Arthritis Mother   . Skin cancer Father   . Diverticulitis Father   . Emphysema Paternal Grandfather   . Heart disease Paternal Grandfather   . Colon cancer Neg Hx     Social History Social History   Tobacco Use  . Smoking status: Never Smoker  . Smokeless tobacco: Never Used  Substance Use Topics  . Alcohol use: No    Frequency: Never  . Drug use: No     Allergies   Celebrex [celecoxib], Codeine, Dilaudid [hydromorphone hcl], and Sulfa antibiotics   Review of Systems Review of Systems  Constitutional: Negative for chills and fever.  HENT: Negative for facial swelling and sore throat.   Respiratory: Negative for shortness of breath.   Cardiovascular: Negative for chest pain.  Gastrointestinal: Negative for abdominal pain, nausea and vomiting.  Genitourinary: Negative for dysuria.  Musculoskeletal: Positive for arthralgias. Negative for back pain and neck pain.  Skin: Negative for rash and wound.  Neurological: Positive for numbness. Negative for headaches.  Psychiatric/Behavioral: The patient is not nervous/anxious.      Physical Exam Updated Vital Signs BP (!) 174/113 (BP Location: Left Arm)   Pulse (!) 57   Temp 97.8 F (36.6 C) (Oral)   Resp 16   Ht 6' (1.829 m)   Wt 118.6 kg   SpO2 100%   BMI 35.45 kg/m   Physical Exam Vitals signs and nursing note reviewed.  Constitutional:      General: He is not in acute distress.    Appearance: He is  well-developed. He is not diaphoretic.  HENT:     Head: Normocephalic and atraumatic.     Mouth/Throat:     Pharynx: No oropharyngeal exudate.  Eyes:     General: No scleral icterus.       Right eye: No discharge.        Left eye: No discharge.     Conjunctiva/sclera: Conjunctivae normal.     Pupils: Pupils are equal, round, and reactive to light.  Neck:     Musculoskeletal: Normal range of motion and neck supple.     Thyroid: No thyromegaly.  Cardiovascular:     Rate and Rhythm: Normal rate and regular rhythm.     Heart sounds: Normal heart sounds. No murmur. No friction rub. No gallop.   Pulmonary:     Effort: Pulmonary effort is normal. No respiratory distress.     Breath sounds: Normal breath sounds. No stridor. No wheezing or rales.  Abdominal:     General: Bowel sounds are normal. There is no distension.     Palpations: Abdomen is soft.     Tenderness: There is no abdominal tenderness. There is no guarding or rebound.  Musculoskeletal:     Right shoulder: He exhibits tenderness and bony tenderness.       Arms:     Comments: No cervical tenderness midline or paraspinal; some tenderness about the right upper trapezius muscle and about the shoulder 5/5 strength with shoulder flexion, extension, internal and external rotation; equal bilateral grip strength Some paresthesias on the right thumb and index finger, but sensation intact Negative Tinel's and reverse Phalen's  Lymphadenopathy:     Cervical: No cervical adenopathy.  Skin:    General: Skin is warm and dry.     Coloration: Skin is not pale.     Findings: No rash.  Neurological:     Mental Status: He is alert.     Coordination: Coordination normal.      ED Treatments / Results  Labs (all labs ordered are listed, but only abnormal results are displayed) Labs Reviewed - No data to display  EKG None  Radiology Dg Shoulder Right  Result Date: 04/29/2019 CLINICAL DATA:  Right shoulder and arm pain, numbness  and tingling, increased. No known injury. EXAM: RIGHT SHOULDER - 2+ VIEW COMPARISON:  None. FINDINGS: There is no evidence of fracture or dislocation. There is no evidence of arthropathy or other focal bone abnormality. Soft tissues are unremarkable. IMPRESSION: Normal examination. Electronically Signed   By: Beckie SaltsSteven  Reid M.D.   On: 04/29/2019 09:21    Procedures Procedures (including critical care time)  Medications Ordered in ED Medications  ketorolac (TORADOL) 30 MG/ML injection 30 mg (30 mg Intramuscular Given 04/29/19 1131)  HYDROcodone-acetaminophen (NORCO/VICODIN) 5-325 MG per tablet 1 tablet (1 tablet Oral Given 04/29/19 1130)  Initial Impression / Assessment and Plan / ED Course  I have reviewed the triage vital signs and the nursing notes.  Pertinent labs & imaging results that were available during my care of the patient were reviewed by me and considered in my medical decision making (see chart for details).        Patient presenting with right shoulder pain and pain rating down the arm with tingling.  Suspect cervical radiculopathy, but also shoulder impingement is possibility considering no neck pain or tenderness.  No emergent indication for MRI today.  Shoulder x-ray is negative.  Will begin Mobic as well as ice, heat and short course of Norco for breakthrough pain.  Patient's blood pressure noted to be elevated today, but decreased to 150/100.  Patient given IM Toradol and Vicodin in the ED.  Patient has an appointment with his PCP today.  I advised to discuss patient's shoulder and arm pain and tingling with them for possible MRI prior to following up with a specialist, considering Ortho versus neurosurgery may be necessary.  I also discussed blood pressure monitoring.  Return precautions discussed.  Patient understands and agrees with plan.  Patient discharged in satisfactory condition  Final Clinical Impressions(s) / ED Diagnoses   Final diagnoses:  Cervical  radiculopathy    ED Discharge Orders         Ordered    meloxicam (MOBIC) 7.5 MG tablet  Daily     04/29/19 1150    HYDROcodone-acetaminophen (NORCO/VICODIN) 5-325 MG tablet  Every 6 hours PRN     04/29/19 [address]1150           Winson Eichorn SelbyM, New JerseyPA-C 04/29/19 1201    Maia PlanLong, Joshua G, MD 04/30/19 1036

## 2019-04-30 LAB — CBC WITH DIFFERENTIAL/PLATELET
Basophils Absolute: 0.1 10*3/uL (ref 0.0–0.1)
Basophils Relative: 1.2 % (ref 0.0–3.0)
Eosinophils Absolute: 0.4 10*3/uL (ref 0.0–0.7)
Eosinophils Relative: 3.8 % (ref 0.0–5.0)
HCT: 42.9 % (ref 39.0–52.0)
Hemoglobin: 14.2 g/dL (ref 13.0–17.0)
Lymphocytes Relative: 21.1 % (ref 12.0–46.0)
Lymphs Abs: 2.1 10*3/uL (ref 0.7–4.0)
MCHC: 33.1 g/dL (ref 30.0–36.0)
MCV: 90.2 fl (ref 78.0–100.0)
Monocytes Absolute: 0.6 10*3/uL (ref 0.1–1.0)
Monocytes Relative: 6.4 % (ref 3.0–12.0)
Neutro Abs: 6.8 10*3/uL (ref 1.4–7.7)
Neutrophils Relative %: 67.5 % (ref 43.0–77.0)
Platelets: 364 10*3/uL (ref 150.0–400.0)
RBC: 4.75 Mil/uL (ref 4.22–5.81)
RDW: 14.1 % (ref 11.5–15.5)
WBC: 10.1 10*3/uL (ref 4.0–10.5)

## 2019-04-30 LAB — COMPREHENSIVE METABOLIC PANEL
ALT: 23 U/L (ref 0–53)
AST: 19 U/L (ref 0–37)
Albumin: 4.2 g/dL (ref 3.5–5.2)
Alkaline Phosphatase: 68 U/L (ref 39–117)
BUN: 12 mg/dL (ref 6–23)
CO2: 26 mEq/L (ref 19–32)
Calcium: 8.9 mg/dL (ref 8.4–10.5)
Chloride: 102 mEq/L (ref 96–112)
Creatinine, Ser: 1.14 mg/dL (ref 0.40–1.50)
GFR: 67.77 mL/min (ref >60.00)
Glucose, Bld: 177 mg/dL — ABNORMAL HIGH (ref 70–99)
Potassium: 3.5 mEq/L (ref 3.5–5.1)
Sodium: 137 mEq/L (ref 135–145)
Total Bilirubin: 0.5 mg/dL (ref 0.2–1.2)
Total Protein: 6.7 g/dL (ref 6.0–8.3)

## 2019-04-30 LAB — LIPID PANEL
Cholesterol: 193 mg/dL (ref 0–200)
HDL: 54.3 mg/dL (ref >39.00)
LDL Cholesterol: 119 mg/dL — ABNORMAL HIGH (ref 0–99)
NonHDL: 138.87
Total CHOL/HDL Ratio: 4
Triglycerides: 98 mg/dL (ref 0.0–149.0)
VLDL: 19.6 mg/dL (ref 0.0–40.0)

## 2019-04-30 LAB — HEMOGLOBIN A1C: Hgb A1c MFr Bld: 6 % (ref 4.6–6.5)

## 2019-04-30 LAB — PSA: PSA: 0.6 ng/mL (ref 0.10–4.00)

## 2019-05-03 ENCOUNTER — Other Ambulatory Visit: Payer: Self-pay | Admitting: Physician Assistant

## 2019-05-03 DIAGNOSIS — G8929 Other chronic pain: Secondary | ICD-10-CM

## 2019-05-03 MED ORDER — HYDROCODONE-ACETAMINOPHEN 5-325 MG PO TABS: 1.0000 | ORAL_TABLET | Freq: Four times a day (QID) | ORAL | 0 refills | Status: DC | PRN

## 2019-05-03 NOTE — Telephone Encounter (Signed)
Advised patient of referral placed to Sports Medicine to have ultrasound and PCP refilled pain medication to the local pharmacy

## 2019-05-03 NOTE — Telephone Encounter (Signed)
Please advise about patient shoulder pain. Per last OV to refer to Sports medicine for ultrasound

## 2019-05-03 NOTE — Telephone Encounter (Signed)
Ok to place referral to Sports medicine. I will give refill of pain medication since we have seen him since ER visit. Refill will be sent to local pharmacy on file.

## 2019-05-03 NOTE — Telephone Encounter (Signed)
Pt called and is requesting an ultrasound on his shoulder and is requesting a pain medication before the weekend if possible. Please advise.   [id number] - [place], [place] - [place]. [address] [place] [address]  Phone: [phone] Fax: [phone]  Not a 24 hour pharmacy; exact hours not known.

## 2019-06-03 ENCOUNTER — Ambulatory Visit (INDEPENDENT_AMBULATORY_CARE_PROVIDER_SITE_OTHER): Payer: Self-pay | Admitting: Family Medicine

## 2019-06-03 ENCOUNTER — Other Ambulatory Visit: Payer: Self-pay

## 2019-06-03 VITALS — BP 132/80 | Ht 72.0 in | Wt 250.0 lb

## 2019-06-03 DIAGNOSIS — M5010 Cervical disc disorder with radiculopathy, unspecified cervical region: Secondary | ICD-10-CM

## 2019-06-03 MED ORDER — CYCLOBENZAPRINE HCL 5 MG PO TABS: 5.0000 mg | ORAL_TABLET | Freq: Every evening | ORAL | 0 refills | Status: DC | PRN

## 2019-06-03 MED ORDER — PREDNISONE 10 MG PO TABS: ORAL_TABLET | ORAL | 0 refills | Status: DC

## 2019-06-03 NOTE — Progress Notes (Signed)
PCP: Waldon MerlMartin, William C, PA-C  Subjective:   HPI: Patient is a 53 y.o. male here for right shoulder pain and hand numbness. His symptoms started gradually about about 6 weeks ago. He had intermittent dull (sometimes sharp) pain at distal aspect of his shoulder that radiated to his arm and also associated with numbness and tingling down arm into his right thumb. No weakness. 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 Hanging his arm make his symptoms worse and rising his arm over his head makes it better. He mentions that he needs to put his arm above his head when going to bed to alleviate the pain and being able to sleep. He denies erythema or swelling or his shoulder. No fever or chills. No abdominal pain, nausea or vomiting. No chest pain or shortness of breath.  Past Medical History:  Diagnosis Date  . Allergy    SEASONAL  . 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 Diverticulitis   . Hypercholesteremia 06/15/2012  . Hyperlipemia   . Sarcoidosis     Current Outpatient Medications on File Prior to Visit  Medication Sig Dispense Refill  . aspirin EC 81 MG tablet Take 81 mg by mouth daily.    . diphenhydrAMINE (BENADRYL) 25 MG tablet Take 25 mg by mouth every 6 (six) hours as needed.    Marland Kitchen. HYDROcodone-acetaminophen (NORCO/VICODIN) 5-325 MG tablet Take 1-2 tablets by mouth every 6 (six) hours as needed for severe pain. 20 tablet 0  . losartan (COZAAR) 50 MG tablet Take 1 tablet (50 mg total) by mouth daily. 30 tablet 3   No current facility-administered medications on file prior to visit.     Past Surgical History:  Procedure Laterality Date  . LAPAROSCOPIC PARTIAL COLECTOMY N/A 07/01/2014   Procedure: LAPAROSCOPIC ASSISTED  SUBTOTAL COLECTOMY INCIDENTAL APPENDECTOMY, PARTIAL OMENTECTOMY AND RIGID PROCTOSCOPY;  Surgeon: Avel Peaceodd Rosenbower, MD;  Location: [facility];  Service: General;  Laterality: N/A;  . OMENTECTOMY   07/01/2014  . vertebral artery stenting  2008    Allergies  Allergen Reactions  . Celebrex [Celecoxib] Other (See Comments)    "Mom is allergic, so I do not take it"   . Codeine Rash  . Dilaudid [Hydromorphone Hcl] Nausea And Vomiting and Anxiety  . Sulfa Antibiotics Nausea And Vomiting    Unsure of reaction    Social History   Socioeconomic History  . Marital status: Married    Spouse name: Not on file  . Number of children: 2  . Years of education: Not on file  . Highest education level: Not on file  Occupational History  . Occupation: Curatormechanic  Social Needs  . Financial resource strain: Not on file  . Food insecurity    Worry: Not on file    Inability: Not on file  . Transportation needs    Medical: Not on file    Non-medical: Not on file  Tobacco Use  . Smoking status: Never Smoker  . Smokeless tobacco: Never Used  Substance and Sexual Activity  . Alcohol use: No    Frequency: Never  . Drug use: No  . Sexual activity: Yes    Comment: Married  Lifestyle  . Physical activity    Days per week: Not on file    Minutes per session: Not on file  . Stress: Not on file  Relationships  . Social connections    Talks  on phone: Not on file    Gets together: Not on file    Attends religious service: Not on file    Active member of club or organization: Not on file    Attends meetings of clubs or organizations: Not on file    Relationship status: Not on file  . Intimate partner violence    Fear of current or ex partner: Not on file    Emotionally abused: Not on file    Physically abused: Not on file    Forced sexual activity: Not on file  Other Topics Concern  . Not on file  Social History Narrative  . Not on file    Family History  Problem Relation Age of Onset  . Allergies Mother   . Hypertension Mother   . Arthritis Mother   . Skin cancer Father   . Diverticulitis Father   . Emphysema Paternal Grandfather   . Heart disease Paternal Grandfather   .  Colon cancer Neg Hx     BP 132/80   Ht 6' (1.829 m)   Wt 250 lb (113.4 kg)   BMI 33.91 kg/m   Review of Systems: See HPI above.     Objective:  Physical Exam:  Gen: NAD, comfortable in exam room  Head and neck: No deformity No cervical spine tenderness ROM is full except extension only 20 degrees with mild discomfort.   5/5 strength upper extremity muscle groups Negative Spurling test Decreased sensation right thumb compared to left.  Rt shoulder: No deformity Mild tenderness to palpation at anterior and posterior aspect of shoulder FROM with 5/5 strength Jobs test is negative, Hawkins, Neers, Empty can are negative  Left shoulder: No deformity No tenderness to palpation FROM with 5/5 strength NVI distally  Right hand: No deformity No tenderness to palpation FROM with 5/5 strength Decreased sensation at right thumb Negative Tinel's and Phalen's  Trace DTR right biceps compared to 1+ on left.  Assessment & Plan:  1. Right arm pain and numbness - patient's history of pain through right shoulder into arm, numbness of thumb, decreased neck extension, pain improved with arm overhead all consistent with cervical radiculopathy (likely C6 considering the distribution of tingling and decreased sensation). No evidence of CTS.  -PO Prednisone (tapeing for 6 days) -Flexeril 5 mg at bedtime PRN -Encouraged simple range of motion exercise -Cervical collar if still painful -Instructed to call us in a week for update and f/u in clinic in a month  -Will consider PT -Will consider MRI in no improvement

## 2019-06-03 NOTE — Patient Instructions (Signed)
You have cervical radiculopathy (a pinched nerve in the neck). Prednisone 6 day dose pack to relieve irritation/inflammation of the nerve. Don't take aleve, meloxicam, or ibuprofen while on the prednisone Flexeril at bedtime as needed for spasms, sleep. Consider cervical collar if severely painful. Simple range of motion exercises within limits of pain to prevent further stiffness. Consider physical therapy for stretching, exercises, traction, and modalities. Heat 15 minutes at a time 3-4 times a day to help with spasms. Watch head position when on computers, texting, when sleeping in bed - should in line with back to prevent further nerve traction and irritation. If not improving we will consider an MRI. Follow up with me in 1 month but call me in a week to let me know how you're doing.

## 2019-06-04 ENCOUNTER — Encounter: Payer: Self-pay | Admitting: Family Medicine

## 2019-06-19 ENCOUNTER — Other Ambulatory Visit: Payer: Self-pay

## 2019-06-19 ENCOUNTER — Encounter: Payer: Self-pay | Admitting: Physician Assistant

## 2019-06-19 ENCOUNTER — Ambulatory Visit (INDEPENDENT_AMBULATORY_CARE_PROVIDER_SITE_OTHER): Payer: Self-pay | Admitting: Physician Assistant

## 2019-06-19 VITALS — BP 136/80 | HR 78 | Temp 98.7°F | Resp 16 | Ht 72.0 in | Wt 262.0 lb

## 2019-06-19 DIAGNOSIS — S0502XA Injury of conjunctiva and corneal abrasion without foreign body, left eye, initial encounter: Secondary | ICD-10-CM

## 2019-06-19 MED ORDER — TOBRAMYCIN-DEXAMETHASONE 0.3-0.1 % OP SUSP: 1.0000 [drp] | Freq: Four times a day (QID) | OPHTHALMIC | 0 refills | Status: DC

## 2019-06-19 NOTE — Progress Notes (Signed)
Patient presents to clinic today c/o 6 days of left eye irritation with intermittent redness, drainage and AM crusting. Symptoms starting after getting dirt in his eye at work. Denies trauma or injury. Denies vision changes, fever, chills, malaise or fatigue. Has been trying to keep from rubbing the area. Does not wear corrective lenses.   Past Medical History:  Diagnosis Date  . Allergy    SEASONAL  . 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 Diverticulitis   . Hypercholesteremia 06/15/2012  . Hyperlipemia   . Sarcoidosis     Current Outpatient Medications on File Prior to Visit  Medication Sig Dispense Refill  . aspirin EC 81 MG tablet Take 81 mg by mouth daily.    . diphenhydrAMINE (BENADRYL) 25 MG tablet Take 25 mg by mouth every 6 (six) hours as needed.    Marland Kitchen losartan (COZAAR) 50 MG tablet Take 1 tablet (50 mg total) by mouth daily. 30 tablet 3   No current facility-administered medications on file prior to visit.     Allergies  Allergen Reactions  . Celebrex [Celecoxib] Other (See Comments)    "Mom is allergic, so I do not take it"   . Codeine Rash  . Dilaudid [Hydromorphone Hcl] Nausea And Vomiting and Anxiety  . Sulfa Antibiotics Nausea And Vomiting    Unsure of reaction    Family History  Problem Relation Age of Onset  . Allergies Mother   . Hypertension Mother   . Arthritis Mother   . Skin cancer Father   . Diverticulitis Father   . Emphysema Paternal Grandfather   . Heart disease Paternal Grandfather   . Colon cancer Neg Hx     Social History   Socioeconomic History  . Marital status: Married    Spouse name: Not on file  . Number of children: 2  . Years of education: Not on file  . Highest education level: Not on file  Occupational History  . Occupation: Curator  Social Needs  . Financial resource strain: Not on file  . Food insecurity    Worry: Not on file    Inability: Not on file  . Transportation needs   Medical: Not on file    Non-medical: Not on file  Tobacco Use  . Smoking status: Never Smoker  . Smokeless tobacco: Never Used  Substance and Sexual Activity  . Alcohol use: No    Frequency: Never  . Drug use: No  . Sexual activity: Yes    Comment: Married  Lifestyle  . Physical activity    Days per week: Not on file    Minutes per session: Not on file  . Stress: Not on file  Relationships  . Social Musician on phone: Not on file    Gets together: Not on file    Attends religious service: Not on file    Active member of club or organization: Not on file    Attends meetings of clubs or organizations: Not on file    Relationship status: Not on file  Other Topics Concern  . Not on file  Social History Narrative  . Not on file    Review of Systems - See HPI.  All other ROS are negative.  BP 136/80   Pulse 78   Temp 98.7 F (37.1 C) (Skin)   Resp 16   Ht 6' (1.829 m)   Wt 262 lb (118.8 kg)   SpO2  98%   BMI 35.53 kg/m   Physical Exam Vitals signs reviewed.  Constitutional:      Appearance: Normal appearance.  HENT:     Head: Normocephalic and atraumatic.     Right Ear: Tympanic membrane normal.     Left Ear: Tympanic membrane normal.  Eyes:     General: Lids are normal. Lids are everted, no foreign bodies appreciated. Vision grossly intact. No visual field deficit.       Right eye: No foreign body, discharge or hordeolum.        Left eye: No foreign body, discharge or hordeolum.     Extraocular Movements: Extraocular movements intact.     Conjunctiva/sclera:     Left eye: Left conjunctiva is injected (slight).     Pupils: Pupils are equal, round, and reactive to light.     Comments: Fluorescein stain applied to left eye and viewed with black light revealing small conjunctival abrasion just inferior to lateral border of iris.   Neck:     Musculoskeletal: Neck supple.  Cardiovascular:     Rate and Rhythm: Normal rate and regular rhythm.     Heart  sounds: Normal heart sounds.  Pulmonary:     Effort: Pulmonary effort is normal.     Breath sounds: Normal breath sounds.  Neurological:     Mental Status: He is alert.     Recent Results (from the past 2160 hour(s))  CBC with Differential/Platelet     Status: None   Collection Time: 04/29/19  3:45 PM  Result Value Ref Range   WBC 10.1 4.0 - 10.5 K/uL   RBC 4.75 4.22 - 5.81 Mil/uL   Hemoglobin 14.2 13.0 - 17.0 g/dL   HCT 42.9 39.0 - 52.0 %   MCV 90.2 78.0 - 100.0 fl   MCHC 33.1 30.0 - 36.0 g/dL   RDW 14.1 11.5 - 15.5 %   Platelets 364.0 150.0 - 400.0 K/uL   Neutrophils Relative % 67.5 43.0 - 77.0 %   Lymphocytes Relative 21.1 12.0 - 46.0 %   Monocytes Relative 6.4 3.0 - 12.0 %   Eosinophils Relative 3.8 0.0 - 5.0 %   Basophils Relative 1.2 0.0 - 3.0 %   Neutro Abs 6.8 1.4 - 7.7 K/uL   Lymphs Abs 2.1 0.7 - 4.0 K/uL   Monocytes Absolute 0.6 0.1 - 1.0 K/uL   Eosinophils Absolute 0.4 0.0 - 0.7 K/uL   Basophils Absolute 0.1 0.0 - 0.1 K/uL  Comprehensive metabolic panel     Status: Abnormal   Collection Time: 04/29/19  3:45 PM  Result Value Ref Range   Sodium 137 135 - 145 mEq/L   Potassium 3.5 3.5 - 5.1 mEq/L   Chloride 102 96 - 112 mEq/L   CO2 26 19 - 32 mEq/L   Glucose, Bld 177 (H) 70 - 99 mg/dL   BUN 12 6 - 23 mg/dL   Creatinine, Ser 1.14 0.40 - 1.50 mg/dL   Total Bilirubin 0.5 0.2 - 1.2 mg/dL   Alkaline Phosphatase 68 39 - 117 U/L   AST 19 0 - 37 U/L   ALT 23 0 - 53 U/L   Total Protein 6.7 6.0 - 8.3 g/dL   Albumin 4.2 3.5 - 5.2 g/dL   Calcium 8.9 8.4 - 10.5 mg/dL   GFR 67.77 >60.00 mL/min  Hemoglobin A1c     Status: None   Collection Time: 04/29/19  3:45 PM  Result Value Ref Range   Hgb A1c MFr Bld 6.0 4.6 -  6.5 %    Comment: Glycemic Control Guidelines for People with Diabetes:Non Diabetic:  <6%Goal of Therapy: <7%Additional Action Suggested:  >8%   Lipid panel     Status: Abnormal   Collection Time: 04/29/19  3:45 PM  Result Value Ref Range   Cholesterol 193  0 - 200 mg/dL    Comment: ATP III Classification       Desirable:  < 200 mg/dL               Borderline High:  200 - 239 mg/dL          High:  > = 295240 mg/dL   Triglycerides 62.198.0 0.0 - 149.0 mg/dL    Comment: Normal:  <308<150 mg/dLBorderline High:  150 - 199 mg/dL   HDL 65.7854.30 >46.96>39.00 mg/dL   VLDL 29.519.6 0.0 - 28.440.0 mg/dL   LDL Cholesterol 132119 (H) 0 - 99 mg/dL   Total CHOL/HDL Ratio 4     Comment:                Men          Women1/2 Average Risk     3.4          3.3Average Risk          5.0          4.42X Average Risk          9.6          7.13X Average Risk          15.0          11.0                       NonHDL 138.87     Comment: NOTE:  Non-HDL goal should be 30 mg/dL higher than patient's LDL goal (i.e. LDL goal of < 70 mg/dL, would have non-HDL goal of < 100 mg/dL)  PSA     Status: None   Collection Time: 04/29/19  3:45 PM  Result Value Ref Range   PSA 0.60 0.10 - 4.00 ng/mL    Comment: Test performed using Access [employer]br[employer]ch PSA Assay, a parmagnetic partical, chemiluminecent immunoassay.    Assessment/Plan: 1. Conjunctival abrasion, left, initial encounter Supportive measures reviewed - Warm compresses, eye drops, avoid touching/rubbing the eye.  Start Tobradex as directed. Encouraged to wear safety goggles at work. RTW note written for patient.  - tobramycin-dexamethasone (TOBRADEX) ophthalmic solution; Place 1-2 drops into the left eye every 6 (six) hours. For 5 days  Dispense: 5 mL; Refill: 0   Piedad ClimesWilliam Cody Dejah Droessler, PA-C

## 2019-06-19 NOTE — Patient Instructions (Signed)
Please avoid touching and rubbing the eyes. Apply warm compress to the eye 2-3 x day for 5-10 minutes. Use the antibiotic/antiinflammatory drops as directed. Symptoms should continue to improve/resolve over the next week.  If you note any new or worsening symptoms, or if things are not improving let me know ASAP.

## 2019-07-03 ENCOUNTER — Ambulatory Visit: Payer: Self-pay | Admitting: Family Medicine

## 2019-07-08 ENCOUNTER — Ambulatory Visit: Payer: Self-pay | Admitting: Family Medicine

## 2019-07-12 ENCOUNTER — Other Ambulatory Visit: Payer: Self-pay

## 2019-07-12 ENCOUNTER — Encounter: Payer: Self-pay | Admitting: Sports Medicine

## 2019-07-12 ENCOUNTER — Ambulatory Visit (INDEPENDENT_AMBULATORY_CARE_PROVIDER_SITE_OTHER): Payer: Self-pay | Admitting: Sports Medicine

## 2019-07-12 DIAGNOSIS — M5412 Radiculopathy, cervical region: Secondary | ICD-10-CM

## 2019-07-12 MED ORDER — PREDNISONE 10 MG PO TABS: ORAL_TABLET | ORAL | 0 refills | Status: DC

## 2019-07-12 NOTE — Assessment & Plan Note (Signed)
Patient having refractory symptoms not responding to short course of oral prednisone. Symptoms more consistent with cervical radiculopathy vs rotator cuff or shoulder pathology. Median nerve seen under u/s today appeared normal. Due to patient not having insurance may not be able to obtain MRI - Patient will call around several places to get pricing on MRI and if affordable he will let us know so he can proceed with getting injections if found to be medically indicated from MRI. - 12 day course of oral prednisone; if he continues to have symptoms can try gabapentin and/or nortriptyline - F/u with [place] in 6 weeks

## 2019-07-12 NOTE — Patient Instructions (Addendum)
It was great to meet you today! Thank you for letting me participate in your care!  Today, we discussed your continued right thumb and first finger numbness with right arm pain. It is most likely coming from nerve impingement or a bulging disc in your neck, which is causing your symptoms called radiculopathy.   We will try a longer course of Prednisone to help with your symptoms. If you don't have any improvement please call us back and we can try some other medications.  Please call around and find the most cost effective place to have your MRI and then let us know. Remember there will be a cost for the shot in your neck as well if that is found to be necessary.   Cervical Radiculopathy  Cervical radiculopathy means that a nerve in the neck (a cervical nerve) is pinched or bruised. This can happen because of an injury to the cervical spine (vertebrae) in the neck, or as a normal part of getting older. This can cause pain or loss of feeling (numbness) that runs from your neck all the way down to your arm and fingers. Often, this condition gets better with rest. Treatment may be needed if the condition does not get better. What are the causes?  A neck injury.  A bulging disk in your spine.  Muscle movements that you cannot control (muscle spasms).  Tight muscles in your neck due to overuse.  Arthritis.  Breakdown in the bones and joints of the spine (spondylosis) due to getting older.  Bone spurs that form near the nerves in the neck. What are the signs or symptoms?  Pain. The pain may: ? Run from the neck to the arm and hand. ? Be very bad or irritating. ? Be worse when you move your neck.  Loss of feeling or tingling in your arm or hand.  Weakness in your arm or hand, in very bad cases. How is this treated? In many cases, treatment is not needed for this condition. With rest, the condition often gets better over time. If treatment is needed, options may include:  Wearing a  soft neck collar (cervical collar) for short periods of time, as told by your doctor.  Doing exercises (physical therapy) to strengthen your neck muscles.  Taking medicines.  Having shots (injections) in your spine, in very bad cases.  Having surgery. This may be needed if other treatments do not help. The type of surgery that is used depends on the cause of your condition. Follow these instructions at home: If you have a soft neck collar:  Wear it as told by your doctor. Remove it only as told by your doctor.  Ask your doctor if you can remove the collar for cleaning and bathing. If you are allowed to remove the collar for cleaning or bathing: ? Follow instructions from your doctor about how to remove the collar safely. ? Clean the collar by wiping it with mild soap and water and drying it completely. ? Take out any removable pads in the collar every 1-2 days. Wash them by hand with soap and water. Let them air-dry completely before you put them back in the collar. ? Check your skin under the collar for redness or sores. If you see any, tell your doctor. Managing pain      Take over-the-counter and prescription medicines only as told by your doctor.  If told, put ice on the painful area. ? If you have a soft neck collar, remove it  as told by your doctor. ? Put ice in a plastic bag. ? Place a towel between your skin and the bag. ? Leave the ice on for 20 minutes, 2-3 times a day.  If using ice does not help, you can try using heat. Use the heat source that your doctor recommends, such as a moist heat pack or a heating pad. ? Place a towel between your skin and the heat source. ? Leave the heat on for 20-30 minutes. ? Remove the heat if your skin turns bright red. This is very important if you are unable to feel pain, heat, or cold. You may have a greater risk of getting burned.  You may try a gentle neck and shoulder rub (massage). Activity  Rest as needed.  Return to your  normal activities as told by your doctor. Ask your doctor what activities are safe for you.  Do exercises as told by your doctor or physical therapist.  Do not lift anything that is heavier than 10 lb (4.5 kg) until your doctor tells you that it is safe. General instructions  Use a flat pillow when you sleep.  Do not drive while wearing a soft neck collar. If you do not have a soft neck collar, ask your doctor if it is safe to drive while your neck heals.  Ask your doctor if the medicine prescribed to you requires you to avoid driving or using heavy machinery.  Do not use any products that contain nicotine or tobacco, such as cigarettes, e-cigarettes, and chewing tobacco. These can delay healing. If you need help quitting, ask your doctor.  Keep all follow-up visits as told by your doctor. This is important. Contact a doctor if:  Your condition does not get better with treatment. Get help right away if:  Your pain gets worse and is not helped with medicine.  You lose feeling or feel weak in your hand, arm, face, or leg.  You have a high fever.  You have a stiff neck.  You cannot control when you poop or pee (have incontinence).  You have trouble with walking, balance, or talking. Summary  Cervical radiculopathy means that a nerve in the neck is pinched or bruised.  A nerve can get pinched from a bulging disk, arthritis, an injury to the neck, or other causes.  Symptoms include pain, tingling, or loss of feeling that goes from the neck into the arm or hand.  Weakness in your arm or hand can happen in very bad cases.  Treatment may include resting, wearing a soft neck collar, and doing exercises. You might need to take medicines for pain. In very bad cases, shots or surgery may be needed. This information is not intended to replace advice given to you by your health care provider. Make sure you discuss any questions you have with your health care provider. Document Released:  09/15/2011 Document Revised: 08/17/2018 Document Reviewed: 08/17/2018 Elsevier Patient Education  2020 [place] [employer], Harolyn Rutherford, DO PGY-3, [facility]

## 2019-07-12 NOTE — Progress Notes (Addendum)
     Subjective: HPI: [name] [name] is a 53 y.o. presenting to clinic today to discuss the following:  Follow up for neck pain/shoulder pain Patient returns today for follow up for his ongoing numbness and tingling. He was seen by Dr. Barbaraann Barthel in our clinic about one month ago and given a short course of oral prednisone to help with suspected cervical radiculopathy. He states the prednisone helped some for a couple of days but then his symptoms returned. He is having continued numbness and tingling in his right thumb and first finger. He has intermittent shooting pains that radiate from his arm down to his hand that seems to come with overhead movements.  ROS noted in HPI.    Social History   Tobacco Use  Smoking Status Never Smoker  Smokeless Tobacco Never Used    Objective: BP 120/86   Ht 6' (1.829 m)   Wt 255 lb (115.7 kg)   BMI 34.58 kg/m  Vitals and nursing notes reviewed  Physical Exam Gen: NAD HEENT: Normocephalic, atraumatic Neck: No obvious deformity, no cervical spinous process tenderness, full range of motion in flexion, rotation, and side bending, mildly limited extension of cervical spine. MSK: Hand and Wrist, Right: Inspection yielded no erythema, ecchymosis, bony deformity, or swelling. ROM full with good flexion and extension and ulnar/radial deviation that is symmetrical with opposite wrist. Strength 5/5 in all directions without pain. Negative Finkelstein, tinel's and phalens. Patient has intact sensation to dull vs sharp sensation. Skin: warm, dry, intact, no rashes  Assessment/Plan:  Cervical radiculopathy at C6 Patient having refractory symptoms not responding to short course of oral prednisone. Symptoms more consistent with cervical radiculopathy vs rotator cuff or shoulder pathology. Median nerve seen under u/s today appeared normal. Due to patient not having insurance may not be able to obtain MRI - Patient will call around several places to get pricing  on MRI and if affordable he will let us know so he can proceed with getting injections if found to be medically indicated from MRI. - 12 day course of oral prednisone; if he continues to have symptoms can try gabapentin and/or nortriptyline - F/u with [place] in 6 weeks  PATIENT EDUCATION PROVIDED: See AVS    Diagnosis and plan along with any newly prescribed medication(s) were discussed in detail with this patient today. The patient verbalized understanding and agreed with the plan. Patient advised if symptoms worsen return to clinic or ER.    Meds ordered this encounter  Medications  . predniSONE (DELTASONE) 10 MG tablet    Sig: Use as directed per doctors orders.    Dispense:  48 tablet    Refill:  0     Tim Garlan Fillers, DO 07/12/2019, 11:12 AM PGY-3 [facility]  Addendum:  Patient seen in the office by resident and fellow.  Their history, exam, plan of care were precepted with me.  Karlton Lemon MD Kirt Boys

## 2019-07-21 ENCOUNTER — Other Ambulatory Visit: Payer: Self-pay | Admitting: Physician Assistant

## 2019-07-21 DIAGNOSIS — Z8679 Personal history of other diseases of the circulatory system: Secondary | ICD-10-CM

## 2019-08-06 ENCOUNTER — Telehealth: Payer: Self-pay | Admitting: Physician Assistant

## 2019-08-06 NOTE — Telephone Encounter (Signed)
Pt called in stating that the pharmacy told him he didn't have any refills for the Losartan on file. Can we call to check on that? We sent in a new script  On 07/21/2019 to the [facility] on [address]

## 2019-08-06 NOTE — Telephone Encounter (Signed)
Spoke with patient pharmacy and patient didn't pick up his rx for Losartan. Advised patient rx is at the pharmacy and he can go pick up rx. He is agreeable.

## 2019-09-11 ENCOUNTER — Encounter: Payer: Self-pay | Admitting: Internal Medicine

## 2020-02-02 ENCOUNTER — Other Ambulatory Visit: Payer: Self-pay | Admitting: Physician Assistant

## 2020-02-02 DIAGNOSIS — Z8679 Personal history of other diseases of the circulatory system: Secondary | ICD-10-CM

## 2020-06-08 ENCOUNTER — Other Ambulatory Visit: Payer: Self-pay | Admitting: Physician Assistant

## 2020-06-08 DIAGNOSIS — Z8679 Personal history of other diseases of the circulatory system: Secondary | ICD-10-CM

## 2020-07-12 ENCOUNTER — Other Ambulatory Visit: Payer: Self-pay | Admitting: Physician Assistant

## 2020-07-12 DIAGNOSIS — Z8679 Personal history of other diseases of the circulatory system: Secondary | ICD-10-CM

## 2020-07-15 ENCOUNTER — Other Ambulatory Visit: Payer: Self-pay | Admitting: Physician Assistant

## 2020-07-15 DIAGNOSIS — Z8679 Personal history of other diseases of the circulatory system: Secondary | ICD-10-CM

## 2020-07-21 ENCOUNTER — Telehealth: Payer: Self-pay | Admitting: Physician Assistant

## 2020-07-21 DIAGNOSIS — Z8679 Personal history of other diseases of the circulatory system: Secondary | ICD-10-CM

## 2020-07-21 MED ORDER — LOSARTAN POTASSIUM 50 MG PO TABS
50.0000 mg | ORAL_TABLET | Freq: Every day | ORAL | 0 refills | Status: DC
Start: 2020-07-21 — End: 2020-08-25

## 2020-07-21 NOTE — Telephone Encounter (Signed)
Patient scheduled for appointment tomorrow. Rx for Losartan sent to the pharmacy. Will check blood pressure at appointment

## 2020-07-21 NOTE — Telephone Encounter (Signed)
[name] [name] needs his blood pressure pills refilled - Losartain - [facility] [address] - He is completely out of his medication but he has made an appointment for tomorrow.  07/22/2020.  He wants to know if Selena Batten could send in a refill - even if it is for just a few days

## 2020-07-22 ENCOUNTER — Encounter: Payer: Self-pay | Admitting: Physician Assistant

## 2020-08-04 ENCOUNTER — Ambulatory Visit: Payer: BC Managed Care – PPO | Admitting: Physician Assistant

## 2020-08-04 ENCOUNTER — Encounter: Payer: Self-pay | Admitting: Physician Assistant

## 2020-08-04 ENCOUNTER — Other Ambulatory Visit: Payer: Self-pay

## 2020-08-04 VITALS — BP 130/82 | HR 62 | Temp 98.2°F | Resp 16 | Ht 72.0 in | Wt 264.0 lb

## 2020-08-04 DIAGNOSIS — E669 Obesity, unspecified: Secondary | ICD-10-CM | POA: Diagnosis not present

## 2020-08-04 DIAGNOSIS — Z125 Encounter for screening for malignant neoplasm of prostate: Secondary | ICD-10-CM | POA: Diagnosis not present

## 2020-08-04 DIAGNOSIS — I10 Essential (primary) hypertension: Secondary | ICD-10-CM

## 2020-08-04 LAB — COMPREHENSIVE METABOLIC PANEL
ALT: 25 U/L (ref 0–53)
AST: 20 U/L (ref 0–37)
Albumin: 4.2 g/dL (ref 3.5–5.2)
Alkaline Phosphatase: 67 U/L (ref 39–117)
BUN: 11 mg/dL (ref 6–23)
CO2: 25 mEq/L (ref 19–32)
Calcium: 9.4 mg/dL (ref 8.4–10.5)
Chloride: 104 mEq/L (ref 96–112)
Creatinine, Ser: 1.17 mg/dL (ref 0.40–1.50)
GFR: 71.88 mL/min (ref >60.00)
Glucose, Bld: 117 mg/dL — ABNORMAL HIGH (ref 70–99)
Potassium: 4.3 mEq/L (ref 3.5–5.1)
Sodium: 138 mEq/L (ref 135–145)
Total Bilirubin: 0.7 mg/dL (ref 0.2–1.2)
Total Protein: 6.7 g/dL (ref 6.0–8.3)

## 2020-08-04 LAB — CBC WITH DIFFERENTIAL/PLATELET
Basophils Absolute: 0.1 10*3/uL (ref 0.0–0.1)
Basophils Relative: 1.1 % (ref 0.0–3.0)
Eosinophils Absolute: 0.3 10*3/uL (ref 0.0–0.7)
Eosinophils Relative: 3.7 % (ref 0.0–5.0)
HCT: 45.2 % (ref 39.0–52.0)
Hemoglobin: 15.1 g/dL (ref 13.0–17.0)
Lymphocytes Relative: 15.9 % (ref 12.0–46.0)
Lymphs Abs: 1.4 10*3/uL (ref 0.7–4.0)
MCHC: 33.5 g/dL (ref 30.0–36.0)
MCV: 90.8 fl (ref 78.0–100.0)
Monocytes Absolute: 0.7 10*3/uL (ref 0.1–1.0)
Monocytes Relative: 8.1 % (ref 3.0–12.0)
Neutro Abs: 6.4 10*3/uL (ref 1.4–7.7)
Neutrophils Relative %: 71.2 % (ref 43.0–77.0)
Platelets: 354 10*3/uL (ref 150.0–400.0)
RBC: 4.98 Mil/uL (ref 4.22–5.81)
RDW: 14.2 % (ref 11.5–15.5)
WBC: 9 10*3/uL (ref 4.0–10.5)

## 2020-08-04 LAB — LIPID PANEL
Cholesterol: 183 mg/dL (ref 0–200)
HDL: 51.2 mg/dL (ref >39.00)
LDL Cholesterol: 118 mg/dL — ABNORMAL HIGH (ref 0–99)
NonHDL: 132.23
Total CHOL/HDL Ratio: 4
Triglycerides: 72 mg/dL (ref 0.0–149.0)
VLDL: 14.4 mg/dL (ref 0.0–40.0)

## 2020-08-04 LAB — PSA: PSA: 0.51 ng/mL (ref 0.10–4.00)

## 2020-08-04 NOTE — Patient Instructions (Signed)
Please go to the lab today for blood work.  I will call you with your results. We will alter treatment regimen(s) if indicated by your results.    Heart-Healthy Eating Plan Heart-healthy meal planning includes:  Eating less unhealthy fats.  Eating more healthy fats.  Making other changes in your diet. Talk with your doctor or a diet specialist (dietitian) to create an eating plan that is right for you. What is my plan? Your doctor may recommend an eating plan that includes:  Total fat: ______% or less of total calories a day.  Saturated fat: ______% or less of total calories a day.  Cholesterol: less than _________mg a day. What are tips for following this plan? Cooking Avoid frying your food. Try to bake, boil, grill, or broil it instead. You can also reduce fat by:  Removing the skin from poultry.  Removing all visible fats from meats.  Steaming vegetables in water or broth. Meal planning   At meals, divide your plate into four equal parts: ? Fill one-half of your plate with vegetables and green salads. ? Fill one-fourth of your plate with whole grains. ? Fill one-fourth of your plate with lean protein foods.  Eat 4-5 servings of vegetables per day. A serving of vegetables is: ? 1 cup of raw or cooked vegetables. ? 2 cups of raw leafy greens.  Eat 4-5 servings of fruit per day. A serving of fruit is: ? 1 medium whole fruit. ?  cup of dried fruit. ?  cup of fresh, frozen, or canned fruit. ?  cup of 100% fruit juice.  Eat more foods that have soluble fiber. These are apples, broccoli, carrots, beans, peas, and barley. Try to get 20-30 g of fiber per day.  Eat 4-5 servings of nuts, legumes, and seeds per week: ? 1 serving of dried beans or legumes equals  cup after being cooked. ? 1 serving of nuts is  cup. ? 1 serving of seeds equals 1 tablespoon. General information  Eat more home-cooked food. Eat less restaurant, buffet, and fast food.  Limit or avoid  alcohol.  Limit foods that are high in starch and sugar.  Avoid fried foods.  Lose weight if you are overweight.  Keep track of how much salt (sodium) you eat. This is important if you have high blood pressure. Ask your doctor to tell you more about this.  Try to add vegetarian meals each week. Fats  Choose healthy fats. These include olive oil and canola oil, flaxseeds, walnuts, almonds, and seeds.  Eat more omega-3 fats. These include salmon, mackerel, sardines, tuna, flaxseed oil, and ground flaxseeds. Try to eat fish at least 2 times each week.  Check food labels. Avoid foods with trans fats or high amounts of saturated fat.  Limit saturated fats. ? These are often found in animal products, such as meats, butter, and cream. ? These are also found in plant foods, such as palm oil, palm kernel oil, and coconut oil.  Avoid foods with partially hydrogenated oils in them. These have trans fats. Examples are stick margarine, some tub margarines, cookies, crackers, and other baked goods. What foods can I eat? Fruits All fresh, canned (in natural juice), or frozen fruits. Vegetables Fresh or frozen vegetables (raw, steamed, roasted, or grilled). Green salads. Grains Most grains. Choose whole wheat and whole grains most of the time. Rice and pasta, including brown rice and pastas made with whole wheat. Meats and other proteins Lean, well-trimmed beef, veal, pork, and lamb.  Chicken and [place] without skin. All fish and shellfish. Wild duck, rabbit, pheasant, and venison. Egg whites or low-cholesterol egg substitutes. Dried beans, peas, lentils, and tofu. Seeds and most nuts. Dairy Low-fat or nonfat cheeses, including ricotta and mozzarella. Skim or 1% milk that is liquid, powdered, or evaporated. Buttermilk that is made with low-fat milk. Nonfat or low-fat yogurt. Fats and oils Non-hydrogenated (trans-free) margarines. Vegetable oils, including soybean, sesame, sunflower, olive, peanut,  safflower, corn, canola, and cottonseed. Salad dressings or mayonnaise made with a vegetable oil. Beverages Mineral water. Coffee and tea. Diet carbonated beverages. Sweets and desserts Sherbet, gelatin, and fruit ice. Small amounts of dark chocolate. Limit all sweets and desserts. Seasonings and condiments All seasonings and condiments. The items listed above may not be a complete list of foods and drinks you can eat. Contact a dietitian for more options. What foods should I avoid? Fruits Canned fruit in heavy syrup. Fruit in cream or butter sauce. Fried fruit. Limit coconut. Vegetables Vegetables cooked in cheese, cream, or butter sauce. Fried vegetables. Grains Breads that are made with saturated or trans fats, oils, or whole milk. Croissants. Sweet rolls. Donuts. High-fat crackers, such as cheese crackers. Meats and other proteins Fatty meats, such as hot dogs, ribs, sausage, bacon, rib-eye roast or steak. High-fat deli meats, such as salami and bologna. Caviar. Domestic duck and goose. Organ meats, such as liver. Dairy Cream, sour cream, cream cheese, and creamed cottage cheese. Whole-milk cheeses. Whole or 2% milk that is liquid, evaporated, or condensed. Whole buttermilk. Cream sauce or high-fat cheese sauce. Yogurt that is made from whole milk. Fats and oils Meat fat, or shortening. Cocoa butter, hydrogenated oils, palm oil, coconut oil, palm kernel oil. Solid fats and shortenings, including bacon fat, salt pork, lard, and butter. Nondairy cream substitutes. Salad dressings with cheese or sour cream. Beverages Regular sodas and juice drinks with added sugar. Sweets and desserts Frosting. Pudding. Cookies. Cakes. Pies. Milk chocolate or white chocolate. Buttered syrups. Full-fat ice cream or ice cream drinks. The items listed above may not be a complete list of foods and drinks to avoid. Contact a dietitian for more information. Summary  Heart-healthy meal planning includes eating  less unhealthy fats, eating more healthy fats, and making other changes in your diet.  Eat a balanced diet. This includes fruits and vegetables, low-fat or nonfat dairy, lean protein, nuts and legumes, whole grains, and heart-healthy oils and fats. This information is not intended to replace advice given to you by your health care provider. Make sure you discuss any questions you have with your health care provider. Document Revised: 11/30/2017 Document Reviewed: 11/03/2017 Elsevier Patient Education  2020 [employer].

## 2020-08-04 NOTE — Progress Notes (Signed)
Patient presents to clinic today for follow-up of hypertension. Patient is also overdue for blood work.   In regards to hypertension, patient is currently on a regimen of losartan 50 mg daily. Endorses taking daily as directed and tolerating well. Also with a history of hyperlipidemia, on 81 mg ASA daily. Previously declining statin medication. Patient denies chest pain, palpitations, lightheadedness, dizziness, vision changes or frequent headaches. Diet -- poor diet overall. Notes good portion sizes and choices but is a snacker between meals. Stays very active at his job. Outside of work, he goes on walks occasionally and does yard work.   BP Readings from Last 3 Encounters:  08/04/20 130/82  07/12/19 120/86  06/19/19 136/80   Past Medical History:  Diagnosis Date  . Allergy    SEASONAL  . 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 Diverticulitis   . Hypercholesteremia 06/15/2012  . Hyperlipemia   . Sarcoidosis     Current Outpatient Medications on File Prior to Visit  Medication Sig Dispense Refill  . aspirin EC 81 MG tablet Take 81 mg by mouth daily.    . diphenhydrAMINE (BENADRYL) 25 MG tablet Take 25 mg by mouth every 6 (six) hours as needed.    Marland Kitchen losartan (COZAAR) 50 MG tablet Take 1 tablet (50 mg total) by mouth daily. Due for physical. Please schedule appointment for refills 30 tablet 0   No current facility-administered medications on file prior to visit.    Allergies  Allergen Reactions  . Celebrex [Celecoxib] Other (See Comments)    "Mom is allergic, so I do not take it"   . Codeine Rash  . Dilaudid [Hydromorphone Hcl] Nausea And Vomiting and Anxiety  . Sulfa Antibiotics Nausea And Vomiting    Unsure of reaction    Family History  Problem Relation Age of Onset  . Allergies Mother   . Hypertension Mother   . Arthritis Mother   . Skin cancer Father   . Diverticulitis Father   . Emphysema Paternal Grandfather   . Heart disease  Paternal Grandfather   . Colon cancer Neg Hx     Social History   Socioeconomic History  . Marital status: Married    Spouse name: Not on file  . Number of children: 2  . Years of education: Not on file  . Highest education level: Not on file  Occupational History  . Occupation: Curator  Tobacco Use  . Smoking status: Never Smoker  . Smokeless tobacco: Never Used  Vaping Use  . Vaping Use: Never used  Substance and Sexual Activity  . Alcohol use: No  . Drug use: No  . Sexual activity: Yes    Comment: Married  Other Topics Concern  . Not on file  Social History Narrative  . Not on file   Social Determinants of Health   Financial Resource Strain:   . Difficulty of Paying Living Expenses: Not on file  Food Insecurity:   . Worried About Programme researcher, broadcasting/film/video in the Last Year: Not on file  . Ran Out of Food in the Last Year: Not on file  Transportation Needs:   . Lack of Transportation (Medical): Not on file  . Lack of Transportation (Non-Medical): Not on file  Physical Activity:   . Days of Exercise per Week: Not on file  . Minutes of Exercise per Session: Not on file  Stress:   . Feeling of Stress : Not on file  Social Connections:   . Frequency of Communication with Friends and Family: Not on file  . Frequency of Social Gatherings with Friends and Family: Not on file  . Attends Religious Services: Not on file  . Active Member of Clubs or Organizations: Not on file  . Attends Banker Meetings: Not on file  . Marital Status: Not on file   Review of Systems - See HPI.  All other ROS are negative.  BP 130/82   Pulse 62   Temp 98.2 F (36.8 C) (Temporal)   Resp 16   Ht 6' (1.829 m)   Wt 264 lb (119.7 kg)   SpO2 98%   BMI 35.80 kg/m   Physical Exam Vitals reviewed.  Constitutional:      Appearance: Normal appearance.  HENT:     Head: Normocephalic and atraumatic.  Eyes:     Conjunctiva/sclera: Conjunctivae normal.     Pupils: Pupils are  equal, round, and reactive to light.  Cardiovascular:     Rate and Rhythm: Normal rate and regular rhythm.     Pulses: Normal pulses.     Heart sounds: Normal heart sounds.  Pulmonary:     Effort: Pulmonary effort is normal.     Breath sounds: Normal breath sounds.  Musculoskeletal:     Cervical back: Neck supple.  Neurological:     General: No focal deficit present.     Mental Status: He is alert and oriented to person, place, and time.  Psychiatric:        Mood and Affect: Mood normal.    Assessment/Plan: 1. Primary hypertension BP stable. Asymptomatic. Continue current regimen. Will obtain fasting labs today.  - CBC with Differential/Platelet - Comprehensive metabolic panel - Lipid panel  2. Obesity (BMI 30-39.9) Dietary and exercise recommendations reviewed. Labs as noted below. If glucose elevated will add on A1C to further assess for prediabetes, etc.  - CBC with Differential/Platelet - Comprehensive metabolic panel - Lipid panel  3. Prostate cancer screening  He indicates understanding of the limitations of this screening test and wishes to proceed with screening PSA testing.  - PSA  This visit occurred during the SARS-CoV-2 public health emergency.  Safety protocols were in place, including screening questions prior to the visit, additional usage of staff PPE, and extensive cleaning of exam room while observing appropriate contact time as indicated for disinfecting solutions.     Piedad Climes, PA-C

## 2020-08-05 ENCOUNTER — Other Ambulatory Visit (INDEPENDENT_AMBULATORY_CARE_PROVIDER_SITE_OTHER): Payer: BC Managed Care – PPO

## 2020-08-05 DIAGNOSIS — R7309 Other abnormal glucose: Secondary | ICD-10-CM

## 2020-08-05 LAB — HEMOGLOBIN A1C: Hgb A1c MFr Bld: 6.5 % (ref 4.6–6.5)

## 2020-08-11 ENCOUNTER — Encounter: Payer: Self-pay | Admitting: Physician Assistant

## 2020-08-11 ENCOUNTER — Telehealth (INDEPENDENT_AMBULATORY_CARE_PROVIDER_SITE_OTHER): Payer: BC Managed Care – PPO | Admitting: Physician Assistant

## 2020-08-11 ENCOUNTER — Other Ambulatory Visit: Payer: Self-pay

## 2020-08-11 DIAGNOSIS — E119 Type 2 diabetes mellitus without complications: Secondary | ICD-10-CM | POA: Diagnosis not present

## 2020-08-11 NOTE — Progress Notes (Signed)
° °  Virtual Visit via Video   I connected with patient on 08/11/20 at  4:00 PM EDT by a video enabled telemedicine application and verified that I am speaking with the correct person using two identifiers.  Location patient: Home Location provider: [facility] Persons participating in the virtual visit: Patient, Provider, CMA (Patina Moore)  I discussed the limitations of evaluation and management by telemedicine and the availability of in person appointments. The patient expressed understanding and agreed to proceed.  Subjective:   HPI:   Patient presents via Careg[facility] today to discuss recent diagnosis of diabetes. Patient without vision changes, numbness/tingling of hands or feet. Denies polyuria, polydipsia or polyphagia.   Lab Results  Component Value Date   HGBA1C 6.5 08/05/2020   Lab Results  Component Value Date   LDLCALC 118 (H) 08/04/2020     ROS:   See pertinent positives and negatives per HPI.  Patient Active Problem List   Diagnosis Date Noted   Cervical radiculopathy at C6 07/12/2019   Obesity (BMI 30-39.9) 12/18/2017   Sarcoidosis    History of hypertension    Diverticulitis of left colon s/p lap assisted subtotal colectomy 07/01/2014 04/09/2014    Social History   Tobacco Use   Smoking status: Never Smoker   Smokeless tobacco: Never Used  Substance Use Topics   Alcohol use: No    Current Outpatient Medications:    aspirin EC 81 MG tablet, Take 81 mg by mouth daily., Disp: , Rfl:    diphenhydrAMINE (BENADRYL) 25 MG tablet, Take 25 mg by mouth every 6 (six) hours as needed., Disp: , Rfl:    losartan (COZAAR) 50 MG tablet, Take 1 tablet (50 mg total) by mouth daily. Due for physical. Please schedule appointment for refills, Disp: 30 tablet, Rfl: 0  Allergies  Allergen Reactions   Celebrex [Celecoxib] Other (See Comments)    "Mom is allergic, so I do not take it"    Codeine Rash   Dilaudid [Hydromorphone Hcl] Nausea  And Vomiting and Anxiety   Sulfa Antibiotics Nausea And Vomiting    Unsure of reaction    Objective:   There were no vitals taken for this visit.  Patient is well-developed, well-nourished in no acute distress.  Resting comfortably at home.  Head is normocephalic, atraumatic.  No labored breathing.  Speech is clear and coherent with logical content.  Patient is alert and oriented at baseline.   Assessment and Plan:   1. Diabetes mellitus, new onset (HCC) Just in the diabetic range. Lengthy discussion of diagnosis, preventive care and management strategies discussed. He is to schedule eye examination. He is going to work hard on diet and exercise, working on calorie and carb-counting. Goal for exercise is up to 150 minutes per week. Will have him start cinnamon supplement to further hopefully help with insulin resistance. On Losartan 50 mg daily. He is to continue the same. Discussed statin medication with patient giving new LDL goal of < 70. We are going to work hard on diet and exercise. If still in diabetic range at time of follow-up in 3 months, we will initiate statin. Will also complete foot exam and pneumonia vaccine at that time.   Lab Results  Component Value Date   HGBA1C 6.5 08/05/2020      Piedad Climes, [place] 08/11/2020

## 2020-08-11 NOTE — Progress Notes (Signed)
I have discussed the procedure for the virtual visit with the patient who has given consent to proceed with assessment and treatment.   [name] [name], CMA     

## 2020-08-12 ENCOUNTER — Other Ambulatory Visit: Payer: Self-pay | Admitting: Physician Assistant

## 2020-08-12 DIAGNOSIS — Z8679 Personal history of other diseases of the circulatory system: Secondary | ICD-10-CM

## 2020-08-25 ENCOUNTER — Other Ambulatory Visit: Payer: Self-pay | Admitting: Emergency Medicine

## 2020-08-25 DIAGNOSIS — E785 Hyperlipidemia, unspecified: Secondary | ICD-10-CM

## 2020-08-25 DIAGNOSIS — I10 Essential (primary) hypertension: Secondary | ICD-10-CM

## 2020-08-25 DIAGNOSIS — Z8679 Personal history of other diseases of the circulatory system: Secondary | ICD-10-CM

## 2020-08-25 MED ORDER — LOSARTAN POTASSIUM 50 MG PO TABS: 50.0000 mg | ORAL_TABLET | Freq: Every day | ORAL | 1 refills | Status: DC

## 2020-08-25 MED ORDER — ROSUVASTATIN CALCIUM 10 MG PO TABS: 10.0000 mg | ORAL_TABLET | Freq: Every day | ORAL | 1 refills | Status: DC

## 2020-09-14 ENCOUNTER — Ambulatory Visit: Payer: Self-pay | Admitting: Internal Medicine

## 2020-10-29 DIAGNOSIS — Z1152 Encounter for screening for COVID-19: Secondary | ICD-10-CM | POA: Diagnosis not present

## 2020-11-23 ENCOUNTER — Emergency Department (HOSPITAL_BASED_OUTPATIENT_CLINIC_OR_DEPARTMENT_OTHER): Payer: BC Managed Care – PPO

## 2020-11-23 ENCOUNTER — Emergency Department (HOSPITAL_BASED_OUTPATIENT_CLINIC_OR_DEPARTMENT_OTHER)
Admission: EM | Admit: 2020-11-23 | Discharge: 2020-11-23 | Disposition: A | Discharge status: Home / Self Care | Payer: BC Managed Care – PPO | Attending: Emergency Medicine | Admitting: Emergency Medicine

## 2020-11-23 ENCOUNTER — Encounter (HOSPITAL_BASED_OUTPATIENT_CLINIC_OR_DEPARTMENT_OTHER): Payer: Self-pay | Admitting: *Deleted

## 2020-11-23 ENCOUNTER — Other Ambulatory Visit: Payer: Self-pay

## 2020-11-23 DIAGNOSIS — Z7982 Long term (current) use of aspirin: Secondary | ICD-10-CM | POA: Insufficient documentation

## 2020-11-23 DIAGNOSIS — W208XXA Other cause of strike by thrown, projected or falling object, initial encounter: Secondary | ICD-10-CM | POA: Diagnosis not present

## 2020-11-23 DIAGNOSIS — M7989 Other specified soft tissue disorders: Secondary | ICD-10-CM | POA: Diagnosis not present

## 2020-11-23 DIAGNOSIS — I10 Essential (primary) hypertension: Secondary | ICD-10-CM | POA: Insufficient documentation

## 2020-11-23 DIAGNOSIS — S60922A Unspecified superficial injury of left hand, initial encounter: Secondary | ICD-10-CM | POA: Diagnosis not present

## 2020-11-23 DIAGNOSIS — S6992XA Unspecified injury of left wrist, hand and finger(s), initial encounter: Secondary | ICD-10-CM | POA: Diagnosis not present

## 2020-11-23 DIAGNOSIS — M19042 Primary osteoarthritis, left hand: Secondary | ICD-10-CM | POA: Diagnosis not present

## 2020-11-23 NOTE — ED Provider Notes (Signed)
[facility] EMERGENCY DEPARTMENT Provider Note   CSN: [id number] Arrival date & time: 11/23/20  1448     History Chief Complaint  Patient presents with  . Hand Injury    [name] [name] is a 55 y.o. male with past medical history of sarcoidosis, hyperlipidemia, hypercholesteremia, CVA, allergies that presents to the emergency department today for left hand injury.  Patient states that he was jumping a fence 2 days ago when his dog ran out and went into a [employer] officer, political party. Patient states that he thinks he landed on his left hand. States that he is complaining of pain in his posterior area of his left hand. Denies any numbness and tingling. States that he has been able to use his hand and his fingers, however he went to work today and the pain was severe. Patient works as a Curator. States that he is generally healthy, denies any blood thinners. Patient states that he has never had any injury to this area before. No other injury, did not hit his head. No other complaints at this time. States that it was significantly swollen 2 days ago, however the swelling has significantly improved. Has been trying to take Tylenol for this which does help.   HPI     Past Medical History:  Diagnosis Date  . Allergy    SEASONAL  . 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 Diverticulitis   . Hypercholesteremia 06/15/2012  . Hyperlipemia   . Sarcoidosis     Patient Active Problem List   Diagnosis Date Noted  . Cervical radiculopathy at C6 07/12/2019  . Obesity (BMI 30-39.9) 12/18/2017  . Sarcoidosis   . History of hypertension   . Diverticulitis of left colon s/p lap assisted subtotal colectomy 07/01/2014 04/09/2014    Past Surgical History:  Procedure Laterality Date  . LAPAROSCOPIC PARTIAL COLECTOMY N/A 07/01/2014   Procedure: LAPAROSCOPIC ASSISTED  SUBTOTAL COLECTOMY INCIDENTAL APPENDECTOMY, PARTIAL OMENTECTOMY AND RIGID PROCTOSCOPY;  Surgeon: Avel Peace, MD;  Location: [facility] ORS;  Service: General;  Laterality: N/A;  . OMENTECTOMY  07/01/2014  . vertebral artery stenting  2008       Family History  Problem Relation Age of Onset  . Allergies Mother   . Hypertension Mother   . Arthritis Mother   . Skin cancer Father   . Diverticulitis Father   . Emphysema Paternal Grandfather   . Heart disease Paternal Grandfather   . Colon cancer Neg Hx     Social History   Tobacco Use  . Smoking status: Never Smoker  . Smokeless tobacco: Never Used  Vaping Use  . Vaping Use: Never used  Substance Use Topics  . Alcohol use: No  . Drug use: No    Home Medications Prior to Admission medications   Medication Sig Start Date End Date Taking? Authorizing Provider  aspirin EC 81 MG tablet Take 81 mg by mouth daily.    [provider]  diphenhydrAMINE (BENADRYL) 25 MG tablet Take 25 mg by mouth every 6 (six) hours as needed.    [provider]  losartan (COZAAR) 50 MG tablet Take 1 tablet (50 mg total) by mouth daily. 08/25/20   Waldon Merl, PA-C  rosuvastatin (CRESTOR) 10 MG tablet Take 1 tablet (10 mg total) by mouth daily. 08/25/20   Waldon Merl, PA-C    Allergies    Celebrex [celecoxib], Codeine, Dilaudid [hydromorphone hcl], and Sulfa antibiotics  Review of Systems  Review of Systems  Constitutional: Negative for diaphoresis, fatigue and fever.  Eyes: Negative for visual disturbance.  Respiratory: Negative for shortness of breath.   Cardiovascular: Negative for chest pain.  Gastrointestinal: Negative for nausea and vomiting.  Musculoskeletal: Positive for arthralgias. Negative for back pain and myalgias.  Skin: Negative for color change, pallor, rash and wound.  Neurological: Negative for syncope, weakness, light-headedness, numbness and headaches.  Psychiatric/Behavioral: Negative for behavioral problems and confusion.    Physical Exam Updated Vital Signs BP (!) 128/93   Pulse 62   Temp  97.9 F (36.6 C) (Oral)   Resp 16   Ht 6' (1.829 m)   Wt 113.4 kg   SpO2 99%   BMI 33.91 kg/m   Physical Exam Constitutional:      General: He is not in acute distress.    Appearance: Normal appearance. He is not ill-appearing, toxic-appearing or diaphoretic.  Cardiovascular:     Rate and Rhythm: Normal rate and regular rhythm.     Pulses: Normal pulses.  Pulmonary:     Effort: Pulmonary effort is normal.     Breath sounds: Normal breath sounds.  Musculoskeletal:        General: Normal range of motion.       Hands:     Comments: Patient with tenderness to area depicted above. Some swelling noted in this area, no ecchymosis.Normal range of motion to all fingers with normal range of motion to all joints in regards to MCP, PIP and DIP joint. Patient is able to make a fist with normal range of motion to wrist and all digits. Normal opposition. No warmth or erythema. Normal cap refill in all digits. No snuffbox tenderness. Radial pulse 2+  Skin:    General: Skin is warm and dry.     Capillary Refill: Capillary refill takes less than 2 seconds.  Neurological:     General: No focal deficit present.     Mental Status: He is alert and oriented to person, place, and time.  Psychiatric:        Mood and Affect: Mood normal.        Behavior: Behavior normal.        Thought Content: Thought content normal.     ED Results / Procedures / Treatments   Labs (all labs ordered are listed, but only abnormal results are displayed) Labs Reviewed - No data to display  EKG None  Radiology DG Hand Complete Left  Result Date: 11/23/2020 CLINICAL DATA:  Pain and swelling EXAM: LEFT HAND - COMPLETE 3+ VIEW COMPARISON:  None. FINDINGS: There is soft tissue swelling about the posterior aspect of the hand. There is no acute displaced fracture or dislocation. There are mild degenerative changes of the interphalangeal joints. There is no radiopaque foreign body. No radiographic evidence for  osteomyelitis. IMPRESSION: Soft tissue swelling about the posterior aspect of the hand. No acute fracture or dislocation. Electronically Signed   By: Katherine Mantle M.D.   On: 11/23/2020 15:27    Procedures Procedures   Medications Ordered in ED Medications - No data to display  ED Course  I have reviewed the triage vital signs and the nursing notes.  Pertinent labs & imaging results that were available during my care of the patient were reviewed by me and considered in my medical decision making (see chart for details).    MDM Rules/Calculators/A&P  [name] [name] is a 55 y.o. male with past medical history of sarcoidosis, hyperlipidemia, hypercholesteremia, CVA, allergies that presents to the emergency department today for left hand injury. Plain films without any acute fractures, there is swelling on posterior aspect of the hand. Patient is distally vascularly intact with no concerns with nerve or tendon injury. Will have patient follow-up with PCP, did also refer to hand if symptoms do not improve.  Price therapy discussed, patient deferred compression, states that he will buy compression at home. Patient to be discharged at this time.  Doubt need for further emergent work up at this time. I explained the diagnosis and have given explicit precautions to return to the ER including for any other new or worsening symptoms. The patient understands and accepts the medical plan as it's been dictated and I have answered their questions. Discharge instructions concerning home care and prescriptions have been given. The patient is STABLE and is discharged to home in good condition.   Final Clinical Impression(s) / ED Diagnoses Final diagnoses:  Injury of left hand, initial encounter    Rx / DC Orders ED Discharge Orders    None       Farrel Gordon, PA-C 11/23/20 1601    Tegeler, Canary Brim, MD 11/24/20 [phone]

## 2020-11-23 NOTE — ED Triage Notes (Signed)
C/o left hand injury x 2 days ago swelling noted

## 2020-11-23 NOTE — Discharge Instructions (Addendum)
You are seen today for hand injury, as we discussed there is no acute fractures or dislocations. I want you to use the following instructions and follow-up with your primary care provider, if this does not get better in the next couple of days you can see the hand doctor, their information is provided.  Please read and follow all provided instructions.  You have been seen today for hand injury. Use the compression as we discussed.  Tests performed today include: An x-ray of the affected area - does NOT show any broken bones or dislocations.  Vital signs. See below for your results today.   Home care instructions: -- *PRICE in the first 24-48 hours after injury: Protect (with brace, splint, sling), if given by your provider Rest Ice- Do not apply ice pack directly to your skin, place towel or similar between your skin and ice/ice pack. Apply ice for 20 min, then remove for 40 min while awake Compression- Wear brace, elastic bandage, splint as directed by your provider Elevate affected extremity above the level of your heart when not walking around for the first 24-48 hours   Use Ibuprofen (Motrin/Advil) 600mg  every 6 hours as needed for pain (do not exceed max dose in 24 hours, 2400mg )  Follow-up instructions: Please follow-up with your primary care provider or the provided orthopedic physician (bone specialist) if you continue to have significant pain in 1 week. In this case you may have a more severe injury that requires further care.   Return instructions:  Please return if your fingers are numb or tingling, appear gray or blue, or you have severe pain (also elevate the leg and loosen splint or wrap if you were given one) Please return to the Emergency Department if you experience worsening symptoms.  Please return if you have any other emergent concerns. Additional Information:  Your vital signs today were: BP (!) 128/93   Pulse 62   Temp 97.9 F (36.6 C) (Oral)   Resp 16   Ht 6'  (1.829 m)   Wt 113.4 kg   SpO2 99%   BMI 33.91 kg/m  If your blood pressure (BP) was elevated above 135/85 this visit, please have this repeated by your doctor within one month. ---------------

## 2021-03-05 ENCOUNTER — Telehealth: Payer: Self-pay | Admitting: Physician Assistant

## 2021-03-05 ENCOUNTER — Other Ambulatory Visit: Payer: Self-pay

## 2021-03-05 DIAGNOSIS — E785 Hyperlipidemia, unspecified: Secondary | ICD-10-CM

## 2021-03-05 DIAGNOSIS — I10 Essential (primary) hypertension: Secondary | ICD-10-CM

## 2021-03-05 MED ORDER — ROSUVASTATIN CALCIUM 10 MG PO TABS: 10.0000 mg | ORAL_TABLET | Freq: Every day | ORAL | 0 refills | Status: DC

## 2021-03-05 MED ORDER — LOSARTAN POTASSIUM 50 MG PO TABS: 50.0000 mg | ORAL_TABLET | Freq: Every day | ORAL | 0 refills | Status: DC

## 2021-03-05 NOTE — Telephone Encounter (Signed)
Pt scheduled for a TOC with Tabori on 03/24/21

## 2021-03-05 NOTE — Telephone Encounter (Signed)
Ok to establish.  Will need to schedule appt for BP, cholesterol, etc

## 2021-03-05 NOTE — Telephone Encounter (Signed)
Pt scheduled  

## 2021-03-05 NOTE — Telephone Encounter (Signed)
Medication sent to pharmacy. Patient will need a TOC for further refills.

## 2021-03-05 NOTE — Telephone Encounter (Signed)
Pt called in asking if you would take him on as a pt since Selena Batten has moved on.   He states that he has worked at the [employer] in [place] and you have always been so nice to him when you brought your car in.  Please advise

## 2021-03-05 NOTE — Telephone Encounter (Signed)
Pt called in asking for a refill on the Losartan and Rosuvastatin pt uses [facility] on [address].  Pt would also like to know what he can take for joint pain with the medication that he is currently on.   Please advise

## 2021-03-13 DIAGNOSIS — Z20822 Contact with and (suspected) exposure to covid-19: Secondary | ICD-10-CM | POA: Diagnosis not present

## 2021-03-19 ENCOUNTER — Other Ambulatory Visit: Payer: Self-pay

## 2021-03-19 ENCOUNTER — Emergency Department (HOSPITAL_BASED_OUTPATIENT_CLINIC_OR_DEPARTMENT_OTHER)
Admission: EM | Admit: 2021-03-19 | Discharge: 2021-03-19 | Disposition: A | Discharge status: Home / Self Care | Payer: No Typology Code available for payment source | Attending: Emergency Medicine | Admitting: Emergency Medicine

## 2021-03-19 ENCOUNTER — Encounter (HOSPITAL_BASED_OUTPATIENT_CLINIC_OR_DEPARTMENT_OTHER): Payer: Self-pay | Admitting: *Deleted

## 2021-03-19 DIAGNOSIS — Z23 Encounter for immunization: Secondary | ICD-10-CM | POA: Insufficient documentation

## 2021-03-19 DIAGNOSIS — Y9389 Activity, other specified: Secondary | ICD-10-CM | POA: Diagnosis not present

## 2021-03-19 DIAGNOSIS — Z79899 Other long term (current) drug therapy: Secondary | ICD-10-CM | POA: Diagnosis not present

## 2021-03-19 DIAGNOSIS — W228XXA Striking against or struck by other objects, initial encounter: Secondary | ICD-10-CM | POA: Insufficient documentation

## 2021-03-19 DIAGNOSIS — Z7982 Long term (current) use of aspirin: Secondary | ICD-10-CM | POA: Diagnosis not present

## 2021-03-19 DIAGNOSIS — I10 Essential (primary) hypertension: Secondary | ICD-10-CM | POA: Diagnosis not present

## 2021-03-19 DIAGNOSIS — Y990 Civilian activity done for income or pay: Secondary | ICD-10-CM | POA: Insufficient documentation

## 2021-03-19 DIAGNOSIS — Y9289 Other specified places as the place of occurrence of the external cause: Secondary | ICD-10-CM | POA: Insufficient documentation

## 2021-03-19 DIAGNOSIS — S0101XA Laceration without foreign body of scalp, initial encounter: Secondary | ICD-10-CM | POA: Insufficient documentation

## 2021-03-19 DIAGNOSIS — S0990XA Unspecified injury of head, initial encounter: Secondary | ICD-10-CM | POA: Diagnosis present

## 2021-03-19 MED ORDER — TETANUS-DIPHTH-ACELL PERTUSSIS 5-2.5-18.5 LF-MCG/0.5 IM SUSY
0.5000 mL | PREFILLED_SYRINGE | Freq: Once | INTRAMUSCULAR | Status: AC
  Administered 2021-03-19: 14:00:00 0.5 mL via INTRAMUSCULAR
  Filled 2021-03-19: qty 0.5

## 2021-03-19 NOTE — ED Notes (Signed)
Head laceration cleaned and dry dressing applied

## 2021-03-19 NOTE — ED Triage Notes (Signed)
Laceration to top of head from the metal screw.

## 2021-03-19 NOTE — ED Provider Notes (Signed)
[facility] EMERGENCY DEPT Provider Note   CSN: [id number] Arrival date & time: 03/19/21  1346     History Chief Complaint  Patient presents with   Laceration    [name] [name] is a 55 y.o. male.  HPI 55 year old male presents complaining of laceration to scalp.  He states that he was working on a truck when he slid underneath and on the roller.  A piece of metal sliced through his head.  He did not have a fall and no loss of consciousness.  He is not on any blood thinners.  He denies any other injuries.  He does some have some headache.  He does not know when his last tetanus shot was.    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   Diverticulitis    Hypercholesteremia 06/15/2012   Hyperlipemia    Hypertension    Sarcoidosis     Patient Active Problem List   Diagnosis Date Noted   Cervical radiculopathy at C6 07/12/2019   Obesity (BMI 30-39.9) 12/18/2017   Sarcoidosis    History of hypertension    Diverticulitis of left colon s/p lap assisted subtotal colectomy 07/01/2014 04/09/2014    Past Surgical History:  Procedure Laterality Date   LAPAROSCOPIC PARTIAL COLECTOMY N/A 07/01/2014   Procedure: LAPAROSCOPIC ASSISTED  SUBTOTAL COLECTOMY INCIDENTAL APPENDECTOMY, PARTIAL OMENTECTOMY AND RIGID PROCTOSCOPY;  Surgeon: Avel Peace, MD;  Location: [facility];  Service: General;  Laterality: N/A;   OMENTECTOMY  07/01/2014   vertebral artery stenting  2008       Family History  Problem Relation Age of Onset   Allergies Mother    Hypertension Mother    Arthritis Mother    Skin cancer Father    Diverticulitis Father    Emphysema Paternal Grandfather    Heart disease Paternal Grandfather    Colon cancer Neg Hx     Social History   Tobacco Use   Smoking status: Never   Smokeless tobacco: Never  Vaping Use   Vaping Use: Never used  Substance Use Topics   Alcohol  use: Yes    Comment: occasionally   Drug use: No    Home Medications Prior to Admission medications   Medication Sig Start Date End Date Taking? Authorizing Provider  aspirin EC 81 MG tablet Take 81 mg by mouth daily.   Yes [provider]  diphenhydrAMINE (BENADRYL) 25 MG tablet Take 25 mg by mouth every 6 (six) hours as needed.   Yes [provider]  losartan (COZAAR) 50 MG tablet Take 1 tablet (50 mg total) by mouth daily. 03/05/21  Yes Sheliah Hatch, MD  rosuvastatin (CRESTOR) 10 MG tablet Take 1 tablet (10 mg total) by mouth daily. 03/05/21  Yes Sheliah Hatch, MD  zolpidem (AMBIEN) 5 MG tablet Take 5 mg by mouth at bedtime as needed for sleep.   Yes [provider]    Allergies    Celebrex [celecoxib], Codeine, Dilaudid [hydromorphone hcl], and Sulfa antibiotics  Review of Systems   Review of Systems  All other systems reviewed and are negative.  Physical Exam Updated Vital Signs BP (!) 126/100 (BP Location: Left Arm)   Pulse 64   Temp 98.1 F (36.7 C) (Oral)   Resp 18   Ht 1.829 m (6')   Wt 115.7 kg   SpO2 96%   BMI 34.58 kg/m  Physical Exam Vitals and nursing note reviewed.  Constitutional:      General: He is not in acute distress.    Appearance: Normal appearance. He is not ill-appearing.  HENT:     Head: Normocephalic.     Comments: 3 cm laceration front scalp midline    Right Ear: External ear normal.     Left Ear: External ear normal.     Nose: Nose normal.  Eyes:     Extraocular Movements: Extraocular movements intact.     Pupils: Pupils are equal, round, and reactive to light.  Cardiovascular:     Rate and Rhythm: Normal rate.  Pulmonary:     Effort: Pulmonary effort is normal.  Musculoskeletal:        General: Normal range of motion.     Cervical back: Normal range of motion.  Skin:    General: Skin is warm.     Capillary Refill: Capillary refill takes less than 2 seconds.  Neurological:     General: No  focal deficit present.     Mental Status: He is alert.     Cranial Nerves: No cranial nerve deficit.     Motor: No weakness.  Psychiatric:        Mood and Affect: Mood normal.        Behavior: Behavior normal.    ED Results / Procedures / Treatments   Labs (all labs ordered are listed, but only abnormal results are displayed) Labs Reviewed - No data to display  EKG None  Radiology No results found.  Procedures Procedures   Medications Ordered in ED Medications - No data to display  ED Course  I have reviewed the triage vital signs and the nursing notes.  Pertinent labs & imaging results that were available during my care of the patient were reviewed by me and considered in my medical decision making (see chart for details).    MDM Rules/Calculators/A&P                         3 cm laceration to her scalp that does not appear to be through skin.  Plan wound care, dressing, tetanus update, patient did not have fall or blunt trauma.,  Therefore low index of suspicion for intracranial injury. Discussed return precautions.  Final Clinical Impression(s) / ED Diagnoses Final diagnoses:  Laceration of scalp, initial encounter    Rx / DC Orders ED Discharge Orders     None        Margarita Grizzle, MD 03/19/21 1419

## 2021-03-24 ENCOUNTER — Encounter: Payer: Self-pay | Admitting: Family Medicine

## 2021-03-24 ENCOUNTER — Other Ambulatory Visit: Payer: Self-pay

## 2021-03-24 ENCOUNTER — Ambulatory Visit: Payer: BC Managed Care – PPO | Admitting: Family Medicine

## 2021-03-24 VITALS — BP 118/80 | HR 53 | Temp 97.3°F | Resp 17 | Ht 72.0 in | Wt 259.2 lb

## 2021-03-24 DIAGNOSIS — E785 Hyperlipidemia, unspecified: Secondary | ICD-10-CM | POA: Diagnosis not present

## 2021-03-24 DIAGNOSIS — Z114 Encounter for screening for human immunodeficiency virus [HIV]: Secondary | ICD-10-CM | POA: Diagnosis not present

## 2021-03-24 DIAGNOSIS — I10 Essential (primary) hypertension: Secondary | ICD-10-CM

## 2021-03-24 LAB — CBC WITH DIFFERENTIAL/PLATELET
Basophils Absolute: 0.1 10*3/uL (ref 0.0–0.1)
Basophils Relative: 1.2 % (ref 0.0–3.0)
Eosinophils Absolute: 0.4 10*3/uL (ref 0.0–0.7)
Eosinophils Relative: 5 % (ref 0.0–5.0)
HCT: 45.1 % (ref 39.0–52.0)
Hemoglobin: 15.3 g/dL (ref 13.0–17.0)
Lymphocytes Relative: 21.7 % (ref 12.0–46.0)
Lymphs Abs: 1.7 10*3/uL (ref 0.7–4.0)
MCHC: 33.9 g/dL (ref 30.0–36.0)
MCV: 90.9 fl (ref 78.0–100.0)
Monocytes Absolute: 0.7 10*3/uL (ref 0.1–1.0)
Monocytes Relative: 8.9 % (ref 3.0–12.0)
Neutro Abs: 4.9 10*3/uL (ref 1.4–7.7)
Neutrophils Relative %: 63.2 % (ref 43.0–77.0)
Platelets: 313 10*3/uL (ref 150.0–400.0)
RBC: 4.96 Mil/uL (ref 4.22–5.81)
RDW: 14.2 % (ref 11.5–15.5)
WBC: 7.8 10*3/uL (ref 4.0–10.5)

## 2021-03-24 LAB — TSH: TSH: 1.87 u[IU]/mL (ref 0.35–4.50)

## 2021-03-24 LAB — HEPATIC FUNCTION PANEL
ALT: 24 U/L (ref 0–53)
AST: 20 U/L (ref 0–37)
Albumin: 4.3 g/dL (ref 3.5–5.2)
Alkaline Phosphatase: 57 U/L (ref 39–117)
Bilirubin, Direct: 0.2 mg/dL (ref 0.0–0.3)
Total Bilirubin: 0.9 mg/dL (ref 0.2–1.2)
Total Protein: 7 g/dL (ref 6.0–8.3)

## 2021-03-24 LAB — BASIC METABOLIC PANEL
BUN: 15 mg/dL (ref 6–23)
CO2: 24 mEq/L (ref 19–32)
Calcium: 9.1 mg/dL (ref 8.4–10.5)
Chloride: 105 mEq/L (ref 96–112)
Creatinine, Ser: 1.02 mg/dL (ref 0.40–1.50)
GFR: 84.37 mL/min (ref >60.00)
Glucose, Bld: 100 mg/dL — ABNORMAL HIGH (ref 70–99)
Potassium: 4.5 mEq/L (ref 3.5–5.1)
Sodium: 137 mEq/L (ref 135–145)

## 2021-03-24 LAB — LIPID PANEL
Cholesterol: 124 mg/dL (ref 0–200)
HDL: 51.1 mg/dL (ref >39.00)
LDL Cholesterol: 62 mg/dL (ref 0–99)
NonHDL: 72.87
Total CHOL/HDL Ratio: 2
Triglycerides: 55 mg/dL (ref 0.0–149.0)
VLDL: 11 mg/dL (ref 0.0–40.0)

## 2021-03-24 MED ORDER — ZOLPIDEM TARTRATE 5 MG PO TABS: 5.0000 mg | ORAL_TABLET | Freq: Every evening | ORAL | 0 refills | Status: DC | PRN

## 2021-03-24 MED ORDER — COLCHICINE 0.6 MG PO TABS: 0.6000 mg | ORAL_TABLET | Freq: Two times a day (BID) | ORAL | 1 refills | Status: DC | PRN

## 2021-03-24 NOTE — Assessment & Plan Note (Signed)
Chronic problem, on Crestor w/o difficulty.  Check labs.  Adjust meds prn  

## 2021-03-24 NOTE — Assessment & Plan Note (Signed)
Pt's BMI is 35.15  Given his HTN and hyperlipidemia this qualifies as morbidly obese.  Stressed need for healthy diet and regular exercise.  Will continue to follow.

## 2021-03-24 NOTE — Patient Instructions (Addendum)
Schedule your complete physical in 6 months We'll notify you of your lab results and make any changes if needed Continue to work on healthy diet and regular exercise- you can do it! Take the Ambien as needed for sleep Use the Colchicine as needed for gout Call with any questions or concerns Stay Safe!  Stay Healthy! Have a great summer!!!

## 2021-03-24 NOTE — Progress Notes (Signed)
   Subjective:    Patient ID: [name] [name], male    DOB: [birth year], 55 y.o.   MRN: [id number]  HPI HTN- chronic problem, on Losartan 50mg  daily w/ good control.  No CP, SOB, HAs, visual changes, edema.  Hyperlipidemia- chronic problem, on Crestor 10mg  daily.  No abd pain, N/V.  Obesity- BMI is 35.15.  He is down 5 lbs since last visit.  Trying to give up soda and eat 'better'.  No regular exercise  Insomnia- pt prefers not to use Ambien but will take it when he is 'real restless'  Gout- pt has intermittent episodes and takes colchicine as needed   Review of Systems For ROS see HPI   This visit occurred during the SARS-CoV-2 public health emergency.  Safety protocols were in place, including screening questions prior to the visit, additional usage of staff PPE, and extensive cleaning of exam room while observing appropriate contact time as indicated for disinfecting solutions.      Objective:   Physical Exam Vitals reviewed.  Constitutional:      General: He is not in acute distress.    Appearance: Normal appearance. He is well-developed. He is obese.  HENT:     Head: Normocephalic and atraumatic.  Eyes:     Extraocular Movements: Extraocular movements intact.     Conjunctiva/sclera: Conjunctivae normal.     Pupils: Pupils are equal, round, and reactive to light.  Neck:     Thyroid: No thyromegaly.  Cardiovascular:     Rate and Rhythm: Normal rate and regular rhythm.     Pulses: Normal pulses.     Heart sounds: Normal heart sounds. No murmur heard. Pulmonary:     Effort: Pulmonary effort is normal. No respiratory distress.     Breath sounds: Normal breath sounds.  Abdominal:     General: Bowel sounds are normal. There is no distension.     Palpations: Abdomen is soft.  Musculoskeletal:     Cervical back: Normal range of motion and neck supple.     Right lower leg: No edema.     Left lower leg: No edema.  Lymphadenopathy:     Cervical: No cervical adenopathy.   Skin:    General: Skin is warm and dry.  Neurological:     General: No focal deficit present.     Mental Status: He is alert and oriented to person, place, and time.     Cranial Nerves: No cranial nerve deficit.  Psychiatric:        Mood and Affect: Mood normal.        Behavior: Behavior normal.          Assessment & Plan:

## 2021-03-24 NOTE — Assessment & Plan Note (Signed)
Chronic problem, well controlled on Losartan.  Currently asymptomatic.  Check labs.  No anticipated med changes.  Will follow.

## 2021-03-25 LAB — EXTRA SPECIMEN

## 2021-03-25 LAB — HIV ANTIBODY (ROUTINE TESTING W REFLEX): HIV 1&2 Ab, 4th Generation: NONREACTIVE

## 2021-03-29 ENCOUNTER — Other Ambulatory Visit: Payer: Self-pay | Admitting: Family Medicine

## 2021-03-29 DIAGNOSIS — I10 Essential (primary) hypertension: Secondary | ICD-10-CM

## 2021-03-29 DIAGNOSIS — E785 Hyperlipidemia, unspecified: Secondary | ICD-10-CM

## 2021-04-22 ENCOUNTER — Other Ambulatory Visit: Payer: Self-pay | Admitting: Family Medicine

## 2021-04-22 DIAGNOSIS — E785 Hyperlipidemia, unspecified: Secondary | ICD-10-CM

## 2021-04-22 DIAGNOSIS — I10 Essential (primary) hypertension: Secondary | ICD-10-CM

## 2021-05-05 ENCOUNTER — Other Ambulatory Visit: Payer: Self-pay | Admitting: Family Medicine

## 2021-05-31 ENCOUNTER — Other Ambulatory Visit: Payer: Self-pay | Admitting: Family Medicine

## 2021-05-31 DIAGNOSIS — E785 Hyperlipidemia, unspecified: Secondary | ICD-10-CM

## 2021-05-31 DIAGNOSIS — I10 Essential (primary) hypertension: Secondary | ICD-10-CM

## 2021-09-03 ENCOUNTER — Other Ambulatory Visit: Payer: Self-pay | Admitting: Family Medicine

## 2021-09-03 DIAGNOSIS — I10 Essential (primary) hypertension: Secondary | ICD-10-CM

## 2021-09-03 DIAGNOSIS — E785 Hyperlipidemia, unspecified: Secondary | ICD-10-CM

## 2021-09-24 ENCOUNTER — Encounter: Payer: BC Managed Care – PPO | Admitting: Family Medicine

## 2021-12-09 ENCOUNTER — Other Ambulatory Visit: Payer: Self-pay | Admitting: Family Medicine

## 2021-12-09 DIAGNOSIS — I10 Essential (primary) hypertension: Secondary | ICD-10-CM

## 2021-12-09 DIAGNOSIS — E785 Hyperlipidemia, unspecified: Secondary | ICD-10-CM

## 2022-03-10 ENCOUNTER — Other Ambulatory Visit: Payer: Self-pay | Admitting: Family Medicine

## 2022-03-10 DIAGNOSIS — I10 Essential (primary) hypertension: Secondary | ICD-10-CM

## 2022-03-10 DIAGNOSIS — E785 Hyperlipidemia, unspecified: Secondary | ICD-10-CM

## 2022-06-10 ENCOUNTER — Other Ambulatory Visit: Payer: Self-pay | Admitting: Family Medicine

## 2022-06-10 DIAGNOSIS — I10 Essential (primary) hypertension: Secondary | ICD-10-CM

## 2022-06-10 DIAGNOSIS — E785 Hyperlipidemia, unspecified: Secondary | ICD-10-CM

## 2022-07-22 ENCOUNTER — Ambulatory Visit (INDEPENDENT_AMBULATORY_CARE_PROVIDER_SITE_OTHER): Payer: BC Managed Care – PPO | Admitting: Family Medicine

## 2022-07-22 ENCOUNTER — Encounter: Payer: Self-pay | Admitting: Family Medicine

## 2022-07-22 VITALS — BP 130/84 | HR 83 | Temp 98.1°F | Resp 16 | Ht 72.0 in | Wt 273.1 lb

## 2022-07-22 DIAGNOSIS — Z0000 Encounter for general adult medical examination without abnormal findings: Secondary | ICD-10-CM | POA: Diagnosis not present

## 2022-07-22 DIAGNOSIS — Z125 Encounter for screening for malignant neoplasm of prostate: Secondary | ICD-10-CM | POA: Diagnosis not present

## 2022-07-22 DIAGNOSIS — Z789 Other specified health status: Secondary | ICD-10-CM | POA: Diagnosis not present

## 2022-07-22 LAB — HEPATIC FUNCTION PANEL
ALT: 24 U/L (ref 0–53)
AST: 23 U/L (ref 0–37)
Albumin: 4.2 g/dL (ref 3.5–5.2)
Alkaline Phosphatase: 59 U/L (ref 39–117)
Bilirubin, Direct: 0.1 mg/dL (ref 0.0–0.3)
Total Bilirubin: 0.6 mg/dL (ref 0.2–1.2)
Total Protein: 7.2 g/dL (ref 6.0–8.3)

## 2022-07-22 LAB — TSH: TSH: 3.62 u[IU]/mL (ref 0.35–5.50)

## 2022-07-22 LAB — BASIC METABOLIC PANEL
BUN: 12 mg/dL (ref 6–23)
CO2: 26 mEq/L (ref 19–32)
Calcium: 9.3 mg/dL (ref 8.4–10.5)
Chloride: 102 mEq/L (ref 96–112)
Creatinine, Ser: 1.07 mg/dL (ref 0.40–1.50)
GFR: 78.92 mL/min (ref >60.00)
Glucose, Bld: 80 mg/dL (ref 70–99)
Potassium: 4.3 mEq/L (ref 3.5–5.1)
Sodium: 136 mEq/L (ref 135–145)

## 2022-07-22 LAB — LIPID PANEL
Cholesterol: 184 mg/dL (ref 0–200)
HDL: 56 mg/dL (ref >39.00)
LDL Cholesterol: 112 mg/dL — ABNORMAL HIGH (ref 0–99)
NonHDL: 127.91
Total CHOL/HDL Ratio: 3
Triglycerides: 80 mg/dL (ref 0.0–149.0)
VLDL: 16 mg/dL (ref 0.0–40.0)

## 2022-07-22 LAB — PSA: PSA: 0.36 ng/mL (ref 0.10–4.00)

## 2022-07-22 LAB — HEMOGLOBIN A1C: Hgb A1c MFr Bld: 6.6 % — ABNORMAL HIGH (ref 4.6–6.5)

## 2022-07-22 MED ORDER — ZOLPIDEM TARTRATE 5 MG PO TABS: 5.0000 mg | ORAL_TABLET | Freq: Every evening | ORAL | 3 refills | Status: DC | PRN

## 2022-07-22 NOTE — Assessment & Plan Note (Signed)
Deteriorated.  Pt has gained 14 lbs since last visit.  Stressed need for healthy diet and regular exercise.  Pt was on the cusp of DM at last visit.  If labs indicate pt is diabetic plan is to start [facility].  Check labs to risk stratify.  Will follow.

## 2022-07-22 NOTE — Assessment & Plan Note (Signed)
Pt reports Crestor caused severe joint pains and he had to stop

## 2022-07-22 NOTE — Assessment & Plan Note (Signed)
Pt's PE WNL w/ exception of obesity.  Due for colonoscopy- but pt may not need one due to subtotal colectomy.  UTD on Tdap.  Declines flu.  Check labs.  Anticipatory guidance provided.

## 2022-07-22 NOTE — Patient Instructions (Addendum)
Follow up in 3-4 months to recheck weight, sugar We'll notify you of your lab results and make any changes if needed Continue to work on healthy diet- low carb/low sugar- and regular exercise I'll let you know what Dr Henrene Pastor says about the colonoscopy Call with any questions or concerns Stay Safe!  Stay Healthy! Happy Fall!!

## 2022-07-22 NOTE — Progress Notes (Signed)
   Subjective:    Patient ID: [name] [name], male    DOB: 03/25/1966, 56 y.o.   MRN: [id number]  HPI CPE- due for colonoscopy (Dr Henrene Pastor) but unclear if needed due to subtotal colectomy.  Declines flu.  UTD on Tdap.    Health Maintenance  Topic Date Due   COLONOSCOPY (Pts 45-54yrs Insurance coverage will need to be confirmed)  10/19/2016   Zoster Vaccines- Shingrix (1 of 2) 10/22/2022 (Originally 06/27/2018)   INFLUENZA VACCINE  01/08/2023 (Originally 05/10/2022)   TETANUS/TDAP  03/20/2031   Hepatitis C Screening  Completed   HIV Screening  Completed   HPV VACCINES  Aged Out   COVID-19 Vaccine  Discontinued     Review of Systems Patient reports no vision/hearing changes, anorexia, fever ,adenopathy, persistant/recurrent hoarseness, swallowing issues, chest pain, palpitations, edema, persistant/recurrent cough, hemoptysis, dyspnea (rest,exertional, paroxysmal nocturnal), gastrointestinal  bleeding (melena, rectal bleeding), abdominal pain, excessive heart burn, GU symptoms (dysuria, hematuria, voiding/incontinence issues) syncope, focal weakness, memory loss, numbness & tingling, skin/hair/nail changes, depression, anxiety, abnormal bruising/bleeding, musculoskeletal symptoms/signs.   +14 lb weight gain- pt reports he has cut out sodas and is trying to limit sweets but he is having cravings for sugar.  'i'm bigger now than I've ever been'.    Objective:   Physical Exam General Appearance:    Alert, cooperative, no distress, appears stated age, obese  Head:    Normocephalic, without obvious abnormality, atraumatic  Eyes:    PERRL, conjunctiva/corneas clear, EOM's intact both eyes       Ears:    Normal TM's and external ear canals, both ears  Nose:   Nares normal, septum midline, mucosa normal, no drainage   or sinus tenderness  Throat:   Lips, mucosa, and tongue normal; teeth and gums normal  Neck:   Supple, symmetrical, trachea midline, no adenopathy;       thyroid:  No  enlargement/tenderness/nodules  Back:     Symmetric, no curvature, ROM normal, no CVA tenderness  Lungs:     Clear to auscultation bilaterally, respirations unlabored  Chest wall:    No tenderness or deformity  Heart:    Regular rate and rhythm, S1 and S2 normal, no murmur, rub   or gallop  Abdomen:     Soft, non-tender, bowel sounds active all four quadrants,    no masses, no organomegaly  Genitalia:    Deferred  Rectal:    Extremities:   Extremities normal, atraumatic, no cyanosis or edema  Pulses:   2+ and symmetric all extremities  Skin:   Skin color, texture, turgor normal, no rashes or lesions  Lymph nodes:   Cervical, supraclavicular, and axillary nodes normal  Neurologic:   CNII-XII intact. Normal strength, sensation and reflexes      throughout          Assessment & Plan:

## 2022-07-25 LAB — CBC WITH DIFFERENTIAL/PLATELET
Basophils Absolute: 0.1 10*3/uL (ref 0.0–0.1)
Basophils Relative: 1.1 % (ref 0.0–3.0)
Eosinophils Absolute: 0.6 10*3/uL (ref 0.0–0.7)
Eosinophils Relative: 5.7 % — ABNORMAL HIGH (ref 0.0–5.0)
HCT: 44.5 % (ref 39.0–52.0)
Hemoglobin: 15.1 g/dL (ref 13.0–17.0)
Lymphocytes Relative: 23.6 % (ref 12.0–46.0)
Lymphs Abs: 2.3 10*3/uL (ref 0.7–4.0)
MCHC: 33.9 g/dL (ref 30.0–36.0)
MCV: 92.3 fl (ref 78.0–100.0)
Monocytes Absolute: 1.1 10*3/uL — ABNORMAL HIGH (ref 0.1–1.0)
Monocytes Relative: 11 % (ref 3.0–12.0)
Neutro Abs: 5.7 10*3/uL (ref 1.4–7.7)
Neutrophils Relative %: 57.9 % (ref 43.0–77.0)
Platelets: 362 10*3/uL (ref 150.0–400.0)
RBC: 4.82 Mil/uL (ref 4.22–5.81)
RDW: 13.9 % (ref 11.5–15.5)
WBC: 9.9 10*3/uL (ref 4.0–10.5)

## 2022-07-26 ENCOUNTER — Telehealth: Payer: Self-pay

## 2022-07-26 MED ORDER — TIRZEPATIDE 2.5 MG/0.5ML ~~LOC~~ SOAJ: 2.5000 mg | SUBCUTANEOUS | 0 refills | Status: DC

## 2022-07-26 MED ORDER — TIRZEPATIDE 5 MG/0.5ML ~~LOC~~ SOAJ: 5.0000 mg | SUBCUTANEOUS | 1 refills | Status: DC

## 2022-07-26 NOTE — Telephone Encounter (Signed)
PA for pt Rx Darcel Bayley has been submitted thru Cover My Meds . Waiting on response

## 2022-07-26 NOTE — Addendum Note (Signed)
Addended by: Midge Minium on: 07/26/2022 10:06 AM   Modules accepted: Orders

## 2022-07-26 NOTE — Progress Notes (Signed)
I have tried to reach the pt at home and cell . Does not have a VM set up nor answered unable to leave message and reach pt .

## 2022-07-26 NOTE — Progress Notes (Signed)
Attempted to reach the pt no answer at home phone and no vm to leave a message on cell number . Will try again

## 2022-08-01 NOTE — Telephone Encounter (Signed)
Please have pt call his insurance company and see if there is one of these GLP1 injections that is preferred.  Trulicity, Ozempic, Victoza, etc.  That way I will know which one to send in and we won't waste our time filling out prior auths for things that won't get approved

## 2022-08-01 NOTE — Telephone Encounter (Signed)
Sent PA in for pt Rx Mounjaro along with office notes and recent A1C results . [employer] [place] sent a letter today denying the pt PA due to not covered under the pt plan

## 2022-08-01 NOTE — Telephone Encounter (Signed)
Spoke w/ pt and advised him that his insurance denied his PA for the [facility] . I ask him if he could contact his insurance to see if they will cover the Rx Trulicity ,Ozempic,Victozia . He states he will call them and then let us know

## 2022-08-04 ENCOUNTER — Telehealth: Payer: Self-pay

## 2022-08-04 NOTE — Telephone Encounter (Signed)
Informed pt of approval for Mounjaro . Pt is calling pharmacy now to fill

## 2022-08-08 ENCOUNTER — Telehealth: Payer: Self-pay | Admitting: Family Medicine

## 2022-08-08 ENCOUNTER — Encounter: Payer: Self-pay | Admitting: Family Medicine

## 2022-08-08 NOTE — Telephone Encounter (Signed)
Okay to write letter out of work due to side effect?

## 2022-08-08 NOTE — Telephone Encounter (Signed)
Spoke with and advised I have wrote his work excuse as approved by Dr Birdie Riddle . He requested it be emailed to [email or web address] and it has been emailed

## 2022-08-08 NOTE — Telephone Encounter (Signed)
Ok to provide letter for work stating-  'please be advised that [name] [name] is starting and adjusting to a new medication.  One of the side effects of this medication is nausea and vomiting.  Please excuse any time missed and allow him to return to work once feeling better'

## 2022-08-08 NOTE — Telephone Encounter (Signed)
Caller name: ESTEPHAN GALLARDO  On DPR?: Yes  Call back number: [phone] (mobile)  Provider they see: Midge Minium, MD  Reason for call: Pt called stating he feeling sick bc of his new medication Mounjaro 2.5 mg/0.5 mg. Pt had to leave work early, pt states he felt sick to his stomach. Pt want to know if Dr.Tabori give him a letter for job, stating that this just a side of effect to his new medication for job.

## 2022-08-18 ENCOUNTER — Telehealth: Payer: Self-pay

## 2022-08-18 NOTE — Telephone Encounter (Signed)
Patient called in and stated when he opened his mounjaro there were two pens that already had been "injected" the button all the way down liquid all other the box. Wondered what he should do about this. I advised the patient to reach out to the pharmacy and see if there was something they could do some kind of replacement from the manufacturer but if he needs an early fill call us and we will help him get this patient is due for dose on Saturday

## 2022-08-19 MED ORDER — TIRZEPATIDE 5 MG/0.5ML ~~LOC~~ SOAJ: 5.0000 mg | SUBCUTANEOUS | 1 refills | Status: DC

## 2022-08-19 NOTE — Addendum Note (Signed)
Addended by: Eldred Manges on: 08/19/2022 03:07 PM   Modules accepted: Orders

## 2022-08-19 NOTE — Telephone Encounter (Signed)
Patient needs a refill on the mounjaro for early refill due to the discharge in the box , due tomorrow afternoon

## 2022-08-22 ENCOUNTER — Other Ambulatory Visit: Payer: Self-pay

## 2022-09-15 ENCOUNTER — Other Ambulatory Visit: Payer: Self-pay

## 2022-09-15 MED ORDER — TIRZEPATIDE 5 MG/0.5ML ~~LOC~~ SOAJ: 5.0000 mg | SUBCUTANEOUS | 1 refills | Status: DC

## 2022-09-22 ENCOUNTER — Other Ambulatory Visit: Payer: Self-pay | Admitting: Family Medicine

## 2022-09-22 DIAGNOSIS — I10 Essential (primary) hypertension: Secondary | ICD-10-CM

## 2022-09-26 ENCOUNTER — Ambulatory Visit: Payer: BC Managed Care – PPO | Admitting: Family Medicine

## 2022-09-26 ENCOUNTER — Telehealth: Payer: Self-pay | Admitting: Family Medicine

## 2022-09-26 ENCOUNTER — Other Ambulatory Visit: Payer: Self-pay

## 2022-09-26 ENCOUNTER — Encounter: Payer: Self-pay | Admitting: Family Medicine

## 2022-09-26 VITALS — BP 128/80 | HR 58 | Temp 97.9°F | Resp 17 | Ht 72.0 in | Wt 256.1 lb

## 2022-09-26 DIAGNOSIS — I10 Essential (primary) hypertension: Secondary | ICD-10-CM

## 2022-09-26 DIAGNOSIS — E119 Type 2 diabetes mellitus without complications: Secondary | ICD-10-CM | POA: Diagnosis not present

## 2022-09-26 HISTORY — DX: Type 2 diabetes mellitus without complications: E11.9

## 2022-09-26 LAB — MICROALBUMIN / CREATININE URINE RATIO
Creatinine,U: 113.8 mg/dL
Microalb Creat Ratio: 0.6 mg/g (ref 0.0–30.0)
Microalb, Ur: <0.7 mg/dL (ref 0.0–1.9)

## 2022-09-26 MED ORDER — ZOLPIDEM TARTRATE 5 MG PO TABS: 5.0000 mg | ORAL_TABLET | Freq: Every evening | ORAL | 3 refills | Status: DC | PRN

## 2022-09-26 MED ORDER — ZOLPIDEM TARTRATE 5 MG PO TABS: 5.0000 mg | ORAL_TABLET | Freq: Every evening | ORAL | 1 refills | Status: DC | PRN

## 2022-09-26 MED ORDER — LOSARTAN POTASSIUM 50 MG PO TABS: 50.0000 mg | ORAL_TABLET | Freq: Every day | ORAL | 1 refills | Status: DC

## 2022-09-26 NOTE — Progress Notes (Signed)
   Subjective:    Patient ID: [name] [name], male    DOB: 02-Apr-1966, 56 y.o.   MRN: [id number]  HPI DM- new dx for pt at last visit when A1C was 6.6%.  We started [facility] in hopes of helping w/ sugar control and weight loss.  Pt reports when he increased to 5mg  dose he had issues w/ diarrhea.  Denies N/V.  Already on ARB for renal protection.  Due for microalbumin.  No CP, SOB, HA's, visual changes.  Due for eye exam.  No numbness/tingling of hands/feet.  No sores or ulcers on feet.    Obesity- pt is down 17 lbs since last visit just 2 months ago.   Review of Systems For ROS see HPI     Objective:   Physical Exam Vitals reviewed.  Constitutional:      General: He is not in acute distress.    Appearance: Normal appearance. He is well-developed. He is obese. He is not ill-appearing.  HENT:     Head: Normocephalic and atraumatic.  Eyes:     Extraocular Movements: Extraocular movements intact.     Conjunctiva/sclera: Conjunctivae normal.     Pupils: Pupils are equal, round, and reactive to light.  Neck:     Thyroid: No thyromegaly.  Cardiovascular:     Rate and Rhythm: Normal rate and regular rhythm.     Pulses: Normal pulses.     Heart sounds: Normal heart sounds. No murmur heard. Pulmonary:     Effort: Pulmonary effort is normal. No respiratory distress.     Breath sounds: Normal breath sounds.  Abdominal:     General: Bowel sounds are normal. There is no distension.     Palpations: Abdomen is soft.  Musculoskeletal:     Cervical back: Normal range of motion and neck supple.     Right lower leg: No edema.     Left lower leg: No edema.  Lymphadenopathy:     Cervical: No cervical adenopathy.  Skin:    General: Skin is warm and dry.  Neurological:     General: No focal deficit present.     Mental Status: He is alert and oriented to person, place, and time.     Cranial Nerves: No cranial nerve deficit.  Psychiatric:        Mood and Affect: Mood normal.         Behavior: Behavior normal.           Assessment & Plan:

## 2022-09-26 NOTE — Assessment & Plan Note (Signed)
New dx at last visit.  Given his A1C of 6.6% and his obesity, he was a perfect candidate to start Mounjaro.  Pt reports that he had some issues when he increased from the 2.5mg  weekly dose to the 5mg  weekly.  But he was able to take his 2nd dose of the 5mg  w/o difficulty.  Foot exam done today.  Will get microalbumin.  Pt to schedule eye exam.  Pt expressed understanding and is in agreement w/ plan.

## 2022-09-26 NOTE — Patient Instructions (Signed)
Follow up in 3-4 months to recheck sugars We'll notify you of your lab results and make any changes if needed Continue to work on healthy diet and regular exercise- you're doing great!! Call and schedule your eye exam and have them send me a copy of their report Call with any questions or concerns Stay Safe!  Stay Healthy! Happy Holidays!!!

## 2022-09-26 NOTE — Telephone Encounter (Signed)
Medication has been sent to pharmacy and pt is aware.  ?

## 2022-09-26 NOTE — Telephone Encounter (Signed)
Encourage patient to contact the pharmacy for refills or they can request refills through [facility]  (Please schedule appointment if patient has not been seen in over a year)    WHAT PHARMACY WOULD THEY LIKE THIS SENT TO:  [facility] - [place], [place] - [address]    MEDICATION NAME & DOSE:losartan 10 mg   NOTES/COMMENTS FROM PATIENT:Pt needs a refill on this  medication       Front office please notify patient: It takes 48-72 hours to process rx refill requests Ask patient to call pharmacy to ensure rx is ready before heading there.

## 2022-09-26 NOTE — Assessment & Plan Note (Signed)
Pt is down 17 lbs since last visit.  This is due to changing his eating habits and starting Mounjaro.  Will continue to follow.

## 2022-09-27 LAB — BASIC METABOLIC PANEL
BUN: 11 mg/dL (ref 6–23)
CO2: 26 mEq/L (ref 19–32)
Calcium: 9.6 mg/dL (ref 8.4–10.5)
Chloride: 101 mEq/L (ref 96–112)
Creatinine, Ser: 1.09 mg/dL (ref 0.40–1.50)
GFR: 77.08 mL/min (ref >60.00)
Glucose, Bld: 76 mg/dL (ref 70–99)
Potassium: 4 mEq/L (ref 3.5–5.1)
Sodium: 137 mEq/L (ref 135–145)

## 2022-09-27 LAB — HEMOGLOBIN A1C: Hgb A1c MFr Bld: 6 % (ref 4.6–6.5)

## 2022-09-28 ENCOUNTER — Telehealth: Payer: Self-pay

## 2022-09-28 NOTE — Telephone Encounter (Signed)
-----   Message from Sheliah Hatch, MD sent at 09/27/2022  8:57 PM EST ----- Labs look great!  No changes at this time

## 2022-09-28 NOTE — Telephone Encounter (Signed)
Informed pt of lab results  

## 2022-10-11 ENCOUNTER — Encounter (HOSPITAL_BASED_OUTPATIENT_CLINIC_OR_DEPARTMENT_OTHER): Payer: Self-pay | Admitting: Emergency Medicine

## 2022-10-11 ENCOUNTER — Emergency Department (HOSPITAL_BASED_OUTPATIENT_CLINIC_OR_DEPARTMENT_OTHER)
Admission: EM | Admit: 2022-10-11 | Discharge: 2022-10-11 | Disposition: A | Discharge status: Home / Self Care | Payer: BC Managed Care – PPO | Attending: Emergency Medicine | Admitting: Emergency Medicine

## 2022-10-11 ENCOUNTER — Other Ambulatory Visit: Payer: Self-pay

## 2022-10-11 DIAGNOSIS — I10 Essential (primary) hypertension: Secondary | ICD-10-CM | POA: Insufficient documentation

## 2022-10-11 DIAGNOSIS — Z79899 Other long term (current) drug therapy: Secondary | ICD-10-CM | POA: Diagnosis not present

## 2022-10-11 DIAGNOSIS — E119 Type 2 diabetes mellitus without complications: Secondary | ICD-10-CM | POA: Diagnosis not present

## 2022-10-11 DIAGNOSIS — Z7982 Long term (current) use of aspirin: Secondary | ICD-10-CM | POA: Insufficient documentation

## 2022-10-11 DIAGNOSIS — U071 COVID-19: Secondary | ICD-10-CM | POA: Diagnosis not present

## 2022-10-11 DIAGNOSIS — R519 Headache, unspecified: Secondary | ICD-10-CM | POA: Diagnosis not present

## 2022-10-11 LAB — RESP PANEL BY RT-PCR (RSV, FLU A&B, COVID)  RVPGX2
Influenza A by PCR: NEGATIVE
Influenza B by PCR: NEGATIVE
Resp Syncytial Virus by PCR: NEGATIVE
SARS Coronavirus 2 by RT PCR: POSITIVE — AB

## 2022-10-11 MED ORDER — ONDANSETRON HCL 4 MG PO TABS: 4.0000 mg | ORAL_TABLET | Freq: Four times a day (QID) | ORAL | 0 refills | Status: AC

## 2022-10-11 MED ORDER — NIRMATRELVIR/RITONAVIR (PAXLOVID)TABLET: 3.0000 | ORAL_TABLET | Freq: Two times a day (BID) | ORAL | 0 refills | Status: DC

## 2022-10-11 MED ORDER — NIRMATRELVIR/RITONAVIR (PAXLOVID)TABLET: 3.0000 | ORAL_TABLET | Freq: Two times a day (BID) | ORAL | 0 refills | Status: AC

## 2022-10-11 NOTE — ED Notes (Signed)
Discharge paperwork given and verbally understood. 

## 2022-10-11 NOTE — ED Provider Notes (Signed)
[facility] EMERGENCY DEPT Provider Note   CSN: [id number] Arrival date & time: 10/11/22  2620     History  Chief Complaint  Patient presents with   Migraine    [name] [name] is a 57 y.o. male.  Patient is a 57 year old male with a history of diabetes and hypertension who is presenting today with a 24-hour history of nasal congestion that has now developed into a severe headache and a mild cough.  He recently got a call that multiple other family members have gotten sick that they saw during Christmas.  He has not had any nausea or vomiting.  He denies any shortness of breath  The history is provided by the patient.  Migraine       Home Medications Prior to Admission medications   Medication Sig Start Date End Date Taking? Authorizing Provider  nirmatrelvir/ritonavir (PAXLOVID) 20 x 150 MG & 10 x 100MG  TABS Take 3 tablets by mouth 2 (two) times daily for 5 days. Patient GFR is 77. Take nirmatrelvir (150 mg) two tablets twice daily for 5 days and ritonavir (100 mg) one tablet twice daily for 5 days. 10/11/22 10/16/22 Yes Antonique Langford, Loree Fee, MD  ondansetron (ZOFRAN) 4 MG tablet Take 1 tablet (4 mg total) by mouth every 6 (six) hours. 10/11/22  Yes Blanchie Dessert, MD  aspirin EC 81 MG tablet Take 81 mg by mouth daily. Patient not taking: Reported on 09/26/2022    [provider]  colchicine 0.6 MG tablet TAKE 1 TABLET (0.6 MG TOTAL) BY MOUTH 2 (TWO) TIMES DAILY AS NEEDED (FOR GOUT.). 04/22/21   Midge Minium, MD  diphenhydrAMINE (BENADRYL) 25 MG tablet Take 25 mg by mouth every 6 (six) hours as needed.    [provider]  losartan (COZAAR) 50 MG tablet Take 1 tablet (50 mg total) by mouth daily. 09/26/22   Midge Minium, MD  tirzepatide [facility]) 2.5 MG/0.5ML Pen Inject 2.5 mg into the skin once a week. 07/26/22   Midge Minium, MD  tirzepatide [facility]) 5 MG/0.5ML Pen Inject 5 mg into the skin once a week. 09/15/22   Midge Minium, MD  zolpidem (AMBIEN) 5 MG tablet Take 1 tablet (5 mg total) by mouth at bedtime as needed for sleep. 09/26/22   Midge Minium, MD      Allergies    Celebrex [celecoxib], Codeine, Dilaudid [hydromorphone hcl], and Sulfa antibiotics    Review of Systems   Review of Systems  Physical Exam Updated Vital Signs BP 110/77 (BP Location: Right Arm)   Pulse 81   Temp 98 F (36.7 C)   Resp 15   Ht 6' (1.829 m)   Wt 116.2 kg   SpO2 98%   BMI 34.74 kg/m  Physical Exam Vitals and nursing note reviewed.  Constitutional:      General: He is not in acute distress.    Appearance: He is well-developed.  HENT:     Head: Normocephalic and atraumatic.  Eyes:     Conjunctiva/sclera: Conjunctivae normal.     Pupils: Pupils are equal, round, and reactive to light.  Cardiovascular:     Rate and Rhythm: Normal rate and regular rhythm.     Heart sounds: No murmur heard. Pulmonary:     Effort: Pulmonary effort is normal. No respiratory distress.     Breath sounds: Normal breath sounds. No wheezing or rales.  Musculoskeletal:        General: No tenderness. Normal range of motion.  Cervical back: Normal range of motion and neck supple.  Skin:    General: Skin is warm and dry.     Findings: No erythema or rash.  Neurological:     Mental Status: He is alert and oriented to person, place, and time.  Psychiatric:        Behavior: Behavior normal.     ED Results / Procedures / Treatments   Labs (all labs ordered are listed, but only abnormal results are displayed) Labs Reviewed  RESP PANEL BY RT-PCR (RSV, FLU A&B, COVID)  RVPGX2 - Abnormal; Notable for the following components:      Result Value   SARS Coronavirus 2 by RT PCR POSITIVE (*)    All other components within normal limits    EKG None  Radiology No results found.  Procedures Procedures    Medications Ordered in ED Medications - No data to display  ED Course/ Medical Decision Making/ A&P                            Medical Decision Making Amount and/or Complexity of Data Reviewed Labs: ordered. Decision-making details documented in ED Course.  Risk Prescription drug management.   Pt with multiple medical problems and comorbidities and presenting today with a complaint that caries a high risk for morbidity and mortality.  Pt with symptoms consistent with viral URI.  Well appearing here.  No signs of breathing difficulty  No signs of pharyngitis, otitis or abnormal abdominal findings.   COVID positive.  He is a candidate for paxlovid. and pt to return with any further problems.          Final Clinical Impression(s) / ED Diagnoses Final diagnoses:  COVID    Rx / DC Orders ED Discharge Orders          Ordered    nirmatrelvir/ritonavir (PAXLOVID) 20 x 150 MG & 10 x 100MG  TABS  2 times daily        10/11/22 0903    ondansetron (ZOFRAN) 4 MG tablet  Every 6 hours        10/11/22 0903              Blanchie Dessert, MD 10/11/22 [phone]

## 2022-10-11 NOTE — ED Triage Notes (Signed)
Pt arrives to ED with c/o migraine that started at 3am this morning. He notes sinus congestion.

## 2022-10-11 NOTE — Discharge Instructions (Signed)
If you are taking the colchicine tablets at this time for gout you need to discontinue those while you are taking the medication for COVID.  If it makes you very sick discontinue the medication.

## 2022-11-02 ENCOUNTER — Ambulatory Visit: Payer: BC Managed Care – PPO | Admitting: Family Medicine

## 2022-11-02 ENCOUNTER — Encounter: Payer: Self-pay | Admitting: Family Medicine

## 2022-11-02 VITALS — BP 128/74 | HR 64 | Temp 98.3°F | Ht 72.0 in

## 2022-11-02 DIAGNOSIS — R051 Acute cough: Secondary | ICD-10-CM | POA: Diagnosis not present

## 2022-11-02 DIAGNOSIS — L02415 Cutaneous abscess of right lower limb: Secondary | ICD-10-CM | POA: Diagnosis not present

## 2022-11-02 DIAGNOSIS — J069 Acute upper respiratory infection, unspecified: Secondary | ICD-10-CM | POA: Diagnosis not present

## 2022-11-02 LAB — POC INFLUENZA A&B (BINAX/QUICKVUE)
Influenza A, POC: NEGATIVE
Influenza B, POC: NEGATIVE

## 2022-11-02 LAB — POC COVID19 BINAXNOW: SARS Coronavirus 2 Ag: NEGATIVE

## 2022-11-02 MED ORDER — DOXYCYCLINE HYCLATE 100 MG PO TABS: 100.0000 mg | ORAL_TABLET | Freq: Two times a day (BID) | ORAL | 0 refills | Status: DC

## 2022-11-02 NOTE — Progress Notes (Signed)
Subjective:  Patient ID: [name] [name], male    DOB: 19-Jun-1966  Age: 57 y.o. MRN: [id number]  CC:  Chief Complaint  Patient presents with   Nasal Congestion    Congestion, sinus drainage, notes yellow green mucous, notes sleep issues since start, started Monday afternoon, cough to clear throat     HPI [name] [name] presents for   Cough, congestion: Started Monday evening with runny nose, sneezing, congestion. No fever/HA/body ache. Yellow nasal d/c throughout the day.   Last abx for sinus infection a year or two ago.  Eating/drinking ok No chest pain or dyspnea.  Tx: benadryl, nyquil at night. Helping to sleep.   Bump on inside on R thigh: Noticed few nights ago. Sore, under skin, no discharge.  Quarter size.  No treatments.        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   Diabetes mellitus type II, controlled ([place]) 09/26/2022   Diverticulitis    Hypercholesteremia 06/15/2012   Hyperlipemia    Hypertension    Sarcoidosis    Past Surgical History:  Procedure Laterality Date   LAPAROSCOPIC PARTIAL COLECTOMY N/A 07/01/2014   Procedure: LAPAROSCOPIC ASSISTED  SUBTOTAL COLECTOMY INCIDENTAL APPENDECTOMY, PARTIAL OMENTECTOMY AND RIGID PROCTOSCOPY;  Surgeon: Jackolyn Confer, MD;  Location: [facility] ORS;  Service: General;  Laterality: N/A;   OMENTECTOMY  07/01/2014   vertebral artery stenting  2008   Allergies  Allergen Reactions   Celebrex [Celecoxib] Other (See Comments)     "Mom is allergic, so I do not take it"    Codeine Rash   Dilaudid [Hydromorphone Hcl] Nausea And Vomiting and Anxiety   Sulfa Antibiotics Nausea And Vomiting    Unsure of reaction   Prior to Admission medications   Medication Sig Start Date End Date Taking? Authorizing Provider  diphenhydrAMINE (BENADRYL) 25 MG tablet Take 25 mg by mouth every 6 (six) hours as needed.   Yes [provider]  losartan (COZAAR) 50 MG tablet Take 1 tablet (50 mg total) by mouth daily. 09/26/22  Yes Midge Minium, MD  ondansetron (ZOFRAN) 4 MG tablet Take 1 tablet (4 mg total) by mouth every 6 (six) hours. 10/11/22  Yes Plunkett, Loree Fee, MD  tirzepatide [facility]) 5 MG/0.5ML Pen Inject 5 mg into the skin once a week. 09/15/22  Yes Midge Minium, MD  zolpidem (AMBIEN) 5 MG tablet Take 1 tablet (5 mg total) by mouth at bedtime as needed for sleep. 09/26/22  Yes Midge Minium, MD  aspirin EC 81 MG tablet Take 81 mg by mouth daily. Patient not taking: Reported on 09/26/2022    [provider]  tirzepatide [facility]) 2.5 MG/0.5ML Pen Inject 2.5 mg into the skin once a week. Patient not taking: Reported on 11/02/2022 07/26/22   Midge Minium, MD   Social History   Socioeconomic History   Marital status: Married    Spouse name: Not on file   Number of children: 2  Years of education: Not on file   Highest education level: Not on file  Occupational History   Occupation: Curator  Tobacco Use   Smoking status: Never   Smokeless tobacco: Never  Vaping Use   Vaping Use: Never used  Substance and Sexual Activity   Alcohol use: Yes    Comment: occasionally   Drug use: No   Sexual activity: Yes    Comment: Married  Other Topics Concern   Not on file  Social History Narrative   Not on file   Social Determinants of Health   Financial Resource Strain: Not on file  Food Insecurity: Not on file  Transportation Needs: Not on file  Physical Activity: Not on file   Stress: Not on file  Social Connections: Not on file  Intimate Partner Violence: Not on file    Review of Systems   Objective:   Vitals:   11/02/22 1533  BP: 128/74  Pulse: 64  Temp: 98.3 F (36.8 C)  TempSrc: Oral  SpO2: 98%  Height: 6' (1.829 m)     Physical Exam Vitals reviewed.  Constitutional:      Appearance: He is well-developed.  HENT:     Head: Normocephalic and atraumatic.     Right Ear: Tympanic membrane, ear canal and external ear normal.     Left Ear: Tympanic membrane, ear canal and external ear normal.     Nose: No rhinorrhea.     Mouth/Throat:     Pharynx: No oropharyngeal exudate or posterior oropharyngeal erythema.  Eyes:     Conjunctiva/sclera: Conjunctivae normal.     Pupils: Pupils are equal, round, and reactive to light.  Cardiovascular:     Rate and Rhythm: Normal rate and regular rhythm.     Heart sounds: Normal heart sounds. No murmur heard. Pulmonary:     Effort: Pulmonary effort is normal.     Breath sounds: Normal breath sounds. No wheezing, rhonchi or rales.  Abdominal:     Palpations: Abdomen is soft.     Tenderness: There is no abdominal tenderness.  Musculoskeletal:     Cervical back: Neck supple.  Lymphadenopathy:     Cervical: No cervical adenopathy.  Skin:    General: Skin is warm and dry.     Findings: Lesion present. No rash.       Neurological:     Mental Status: He is alert and oriented to person, place, and time.  Psychiatric:        Behavior: Behavior normal.     Results for orders placed or performed in visit on 11/02/22  POC COVID-19  Result Value Ref Range   SARS Coronavirus 2 Ag Negative Negative  POC Influenza A&B(BINAX/QUICKVUE)  Result Value Ref Range   Influenza A, POC Negative Negative   Influenza B, POC Negative Negative      Assessment & Plan:  [name] [name] is a 57 y.o. male . Acute cough - Plan: POC COVID-19, POC Influenza A&B(BINAX/QUICKVUE) Upper respiratory tract infection,  unspecified type  -Negative COVID/flu testing.  Probable viral illness.  Less likely bacterial sinusitis but will be on treatment for abscess below with doxycycline which would cover sinuses as well.  Symptomatic treatment discussed with saline nasal spray, Mucinex, fluids, RTC precautions.  Abscess of right thigh - Plan: doxycycline (VIBRA-TABS) 100 MG tablet  -Likely early abscess right thigh without fluctuant area to incise/drain.  Warm compresses, start doxycycline with RTC/urgent care precautions.  Meds ordered this encounter  Medications   doxycycline (VIBRA-TABS)  100 MG tablet    Sig: Take 1 tablet (100 mg total) by mouth 2 (two) times daily.    Dispense:  20 tablet    Refill:  0   Patient Instructions  COVID and flu test were both negative.  I suspect you have an upper respiratory infection.  Likely from a virus.  Saline nasal spray can help with nasal congestion.  Mucinex if needed for cough. Return to the clinic or go to the nearest emergency room if any of your symptoms worsen or new symptoms occur.  Area on right thigh appears to be an early abscess or skin infection.  See information below.  Apply warm compresses multiple times per day, start antibiotic, and if that area is increasing in size, I recommend recheck to make sure it does not need to be lanced/opened. Take care!  Skin Abscess  A skin abscess is an infected area on or under your skin that contains a collection of pus and other material. An abscess may also be called a furuncle, carbuncle, or boil. An abscess can occur in or on almost any part of your body. Some abscesses break open (rupture) on their own. Most continue to get worse unless they are treated. The infection can spread deeper into the body and eventually into your blood, which can make you feel ill. Treatment usually involves draining the abscess. What are the causes? An abscess occurs when germs, like bacteria, pass through your skin and cause an  infection. This may be caused by: A scrape or cut on your skin. A puncture wound through your skin, including a needle injection or insect bite. Blocked oil or sweat glands. Blocked and infected hair follicles. A cyst that forms beneath your skin (sebaceous cyst) and becomes infected. What increases the risk? This condition is more likely to develop in people who: Have a weak body defense system (immune system). Have diabetes. Have dry and irritated skin. Get frequent injections or use illegal IV drugs. Have a foreign body in a wound, such as a splinter. Have problems with their lymph system or veins. What are the signs or symptoms? Symptoms of this condition include: A painful, firm bump under the skin. A bump with pus at the top. This may break through the skin and drain. Other symptoms include: Redness surrounding the abscess site. Warmth. Swelling of the lymph nodes (glands) near the abscess. Tenderness. A sore on the skin. How is this diagnosed? This condition may be diagnosed based on: A physical exam. Your medical history. A sample of pus. This may be used to find out what is causing the infection. Blood tests. Imaging tests, such as an ultrasound, CT scan, or MRI. How is this treated? A small abscess that drains on its own may not need treatment. Treatment for larger abscesses may include: Moist heat or heat pack applied to the area several times a day. A procedure to drain the abscess (incision and drainage). Antibiotic medicines. For a severe abscess, you may first get antibiotics through an IV and then change to antibiotics by mouth. Follow these instructions at home: Medicines  Take over-the-counter and prescription medicines only as told by your health care provider. If you were prescribed an antibiotic medicine, take it as told by your health care provider. Do not stop taking the antibiotic even if you start to feel better. Abscess care  If you have an abscess  that has not drained, apply heat to the affected area. Use the heat source that your  health care provider recommends, such as a moist heat pack or a heating pad. Place a towel between your skin and the heat source. Leave the heat on for 20-30 minutes. Remove the heat if your skin turns bright red. This is especially important if you are unable to feel pain, heat, or cold. You may have a greater risk of getting burned. Follow instructions from your health care provider about how to take care of your abscess. Make sure you: Cover the abscess with a bandage (dressing). Change your dressing or gauze as told by your health care provider. Wash your hands with soap and water before you change the dressing or gauze. If soap and water are not available, use hand sanitizer. Check your abscess every day for signs of a worsening infection. Check for: More redness, swelling, or pain. More fluid or blood. Warmth. More pus or a bad smell. General instructions To avoid spreading the infection: Do not share personal care items, towels, or hot tubs with others. Avoid making skin contact with other people. Keep all follow-up visits as told by your health care provider. This is important. Contact a health care provider if you have: More redness, swelling, or pain around your abscess. More fluid or blood coming from your abscess. Warm skin around your abscess. More pus or a bad smell coming from your abscess. Muscle aches. Chills or a general ill feeling. Get help right away if you: Have severe pain. See red streaks on your skin spreading away from the abscess. See redness that spreads quickly. Have a fever or chills. Summary A skin abscess is an infected area on or under your skin that contains a collection of pus and other material. A small abscess that drains on its own may not need treatment. Treatment for larger abscesses may include having a procedure to drain the abscess and taking an  antibiotic. This information is not intended to replace advice given to you by your health care provider. Make sure you discuss any questions you have with your health care provider. Document Revised: 12/30/2021 Document Reviewed: 07/05/2021 Elsevier Patient Education  [place] Respiratory Infection, Adult An upper respiratory infection (URI) is a common viral infection of the nose, throat, and upper air passages that lead to the lungs. The most common type of URI is the common cold. URIs usually get better on their own, without medical treatment. What are the causes? A URI is caused by a virus. You may catch a virus by: Breathing in droplets from an infected person's cough or sneeze. Touching something that has been exposed to the virus (is contaminated) and then touching your mouth, nose, or eyes. What increases the risk? You are more likely to get a URI if: You are very young or very old. You have close contact with others, such as at work, school, or a health care facility. You smoke. You have long-term (chronic) heart or lung disease. You have a weakened disease-fighting system (immune system). You have nasal allergies or asthma. You are experiencing a lot of stress. You have poor nutrition. What are the signs or symptoms? A URI usually involves some of the following symptoms: Runny or stuffy (congested) nose. Cough. Sneezing. Sore throat. Headache. Fatigue. Fever. Loss of appetite. Pain in your forehead, behind your eyes, and over your cheekbones (sinus pain). Muscle aches. Redness or irritation of the eyes. Pressure in the ears or face. How is this diagnosed? This condition may be diagnosed based on your medical  history and symptoms, and a physical exam. Your health care provider may use a swab to take a mucus sample from your nose (nasal swab). This sample can be tested to determine what virus is causing the illness. How is this treated? URIs usually  get better on their own within 7-10 days. Medicines cannot cure URIs, but your health care provider may recommend certain medicines to help relieve symptoms, such as: Over-the-counter cold medicines. Cough suppressants. Coughing is a type of defense against infection that helps to clear the respiratory system, so take these medicines only as recommended by your health care provider. Fever-reducing medicines. Follow these instructions at home: Activity Rest as needed. If you have a fever, stay home from work or school until your fever is gone or until your health care provider says your URI cannot spread to other people (is no longer contagious). Your health care provider may have you wear a face mask to prevent your infection from spreading. Relieving symptoms Gargle with a mixture of salt and water 3-4 times a day or as needed. To make salt water, completely dissolve -1 tsp (3-6 g) of salt in 1 cup (237 mL) of warm water. Use a cool-mist humidifier to add moisture to the air. This can help you breathe more easily. Eating and drinking  Drink enough fluid to keep your urine pale yellow. Eat soups and other clear broths. General instructions  Take over-the-counter and prescription medicines only as told by your health care provider. These include cold medicines, fever reducers, and cough suppressants. Do not use any products that contain nicotine or tobacco. These products include cigarettes, chewing tobacco, and vaping devices, such as e-cigarettes. If you need help quitting, ask your health care provider. Stay away from secondhand smoke. Stay up to date on all immunizations, including the yearly (annual) flu vaccine. Keep all follow-up visits. This is important. How to prevent the spread of infection to others URIs can be contagious. To prevent the infection from spreading: Wash your hands with soap and water for at least 20 seconds. If soap and water are not available, use hand  sanitizer. Avoid touching your mouth, face, eyes, or nose. Cough or sneeze into a tissue or your sleeve or elbow instead of into your hand or into the air.  Contact a health care provider if: You are getting worse instead of better. You have a fever or chills. Your mucus is brown or red. You have yellow or brown discharge coming from your nose. You have pain in your face, especially when you bend forward. You have swollen neck glands. You have pain while swallowing. You have white areas in the back of your throat. Get help right away if: You have shortness of breath that gets worse. You have severe or persistent: Headache. Ear pain. Sinus pain. Chest pain. You have chronic lung disease along with any of the following: Making high-pitched whistling sounds when you breathe, most often when you breathe out (wheezing). Prolonged cough (more than 14 days). Coughing up blood. A change in your usual mucus. You have a stiff neck. You have changes in your: Vision. Hearing. Thinking. Mood. These symptoms may be an emergency. Get help right away. Call 911. Do not wait to see if the symptoms will go away. Do not drive yourself to the hospital. Summary An upper respiratory infection (URI) is a common infection of the nose, throat, and upper air passages that lead to the lungs. A URI is caused by a virus. URIs usually get  better on their own within 7-10 days. Medicines cannot cure URIs, but your health care provider may recommend certain medicines to help relieve symptoms. This information is not intended to replace advice given to you by your health care provider. Make sure you discuss any questions you have with your health care provider. Document Revised: 04/28/2021 Document Reviewed: 04/28/2021 Elsevier Patient Education  2023 Elsevier Inc[employer]     Signed,   Meredith Staggers, MD [facility], [facility] 11/02/22 5:15 PM

## 2022-11-02 NOTE — Patient Instructions (Addendum)
COVID and flu test were both negative.  I suspect you have an upper respiratory infection.  Likely from a virus.  Saline nasal spray can help with nasal congestion.  Mucinex if needed for cough. Return to the clinic or go to the nearest emergency room if any of your symptoms worsen or new symptoms occur.  Area on right thigh appears to be an early abscess or skin infection.  See information below.  Apply warm compresses multiple times per day, start antibiotic, and if that area is increasing in size, I recommend recheck to make sure it does not need to be lanced/opened. Take care!  Skin Abscess  A skin abscess is an infected area on or under your skin that contains a collection of pus and other material. An abscess may also be called a furuncle, carbuncle, or boil. An abscess can occur in or on almost any part of your body. Some abscesses break open (rupture) on their own. Most continue to get worse unless they are treated. The infection can spread deeper into the body and eventually into your blood, which can make you feel ill. Treatment usually involves draining the abscess. What are the causes? An abscess occurs when germs, like bacteria, pass through your skin and cause an infection. This may be caused by: A scrape or cut on your skin. A puncture wound through your skin, including a needle injection or insect bite. Blocked oil or sweat glands. Blocked and infected hair follicles. A cyst that forms beneath your skin (sebaceous cyst) and becomes infected. What increases the risk? This condition is more likely to develop in people who: Have a weak body defense system (immune system). Have diabetes. Have dry and irritated skin. Get frequent injections or use illegal IV drugs. Have a foreign body in a wound, such as a splinter. Have problems with their lymph system or veins. What are the signs or symptoms? Symptoms of this condition include: A painful, firm bump under the skin. A bump with  pus at the top. This may break through the skin and drain. Other symptoms include: Redness surrounding the abscess site. Warmth. Swelling of the lymph nodes (glands) near the abscess. Tenderness. A sore on the skin. How is this diagnosed? This condition may be diagnosed based on: A physical exam. Your medical history. A sample of pus. This may be used to find out what is causing the infection. Blood tests. Imaging tests, such as an ultrasound, CT scan, or MRI. How is this treated? A small abscess that drains on its own may not need treatment. Treatment for larger abscesses may include: Moist heat or heat pack applied to the area several times a day. A procedure to drain the abscess (incision and drainage). Antibiotic medicines. For a severe abscess, you may first get antibiotics through an IV and then change to antibiotics by mouth. Follow these instructions at home: Medicines  Take over-the-counter and prescription medicines only as told by your health care provider. If you were prescribed an antibiotic medicine, take it as told by your health care provider. Do not stop taking the antibiotic even if you start to feel better. Abscess care  If you have an abscess that has not drained, apply heat to the affected area. Use the heat source that your health care provider recommends, such as a moist heat pack or a heating pad. Place a towel between your skin and the heat source. Leave the heat on for 20-30 minutes. Remove the heat if your skin turns  bright red. This is especially important if you are unable to feel pain, heat, or cold. You may have a greater risk of getting burned. Follow instructions from your health care provider about how to take care of your abscess. Make sure you: Cover the abscess with a bandage (dressing). Change your dressing or gauze as told by your health care provider. Wash your hands with soap and water before you change the dressing or gauze. If soap and water  are not available, use hand sanitizer. Check your abscess every day for signs of a worsening infection. Check for: More redness, swelling, or pain. More fluid or blood. Warmth. More pus or a bad smell. General instructions To avoid spreading the infection: Do not share personal care items, towels, or hot tubs with others. Avoid making skin contact with other people. Keep all follow-up visits as told by your health care provider. This is important. Contact a health care provider if you have: More redness, swelling, or pain around your abscess. More fluid or blood coming from your abscess. Warm skin around your abscess. More pus or a bad smell coming from your abscess. Muscle aches. Chills or a general ill feeling. Get help right away if you: Have severe pain. See red streaks on your skin spreading away from the abscess. See redness that spreads quickly. Have a fever or chills. Summary A skin abscess is an infected area on or under your skin that contains a collection of pus and other material. A small abscess that drains on its own may not need treatment. Treatment for larger abscesses may include having a procedure to drain the abscess and taking an antibiotic. This information is not intended to replace advice given to you by your health care provider. Make sure you discuss any questions you have with your health care provider. Document Revised: 12/30/2021 Document Reviewed: 07/05/2021 Elsevier Patient Education  2023 Elsevier Inc.   Upper Respiratory Infection, Adult An upper respiratory infection (URI) is a common viral infection of the nose, throat, and upper air passages that lead to the lungs. The most common type of URI is the common cold. URIs usually get better on their own, without medical treatment. What are the causes? A URI is caused by a virus. You may catch a virus by: Breathing in droplets from an infected person's cough or sneeze. Touching something that has  been exposed to the virus (is contaminated) and then touching your mouth, nose, or eyes. What increases the risk? You are more likely to get a URI if: You are very young or very old. You have close contact with others, such as at work, school, or a health care facility. You smoke. You have long-term (chronic) heart or lung disease. You have a weakened disease-fighting system (immune system). You have nasal allergies or asthma. You are experiencing a lot of stress. You have poor nutrition. What are the signs or symptoms? A URI usually involves some of the following symptoms: Runny or stuffy (congested) nose. Cough. Sneezing. Sore throat. Headache. Fatigue. Fever. Loss of appetite. Pain in your forehead, behind your eyes, and over your cheekbones (sinus pain). Muscle aches. Redness or irritation of the eyes. Pressure in the ears or face. How is this diagnosed? This condition may be diagnosed based on your medical history and symptoms, and a physical exam. Your health care provider may use a swab to take a mucus sample from your nose (nasal swab). This sample can be tested to determine what virus is causing the  illness. How is this treated? URIs usually get better on their own within 7-10 days. Medicines cannot cure URIs, but your health care provider may recommend certain medicines to help relieve symptoms, such as: Over-the-counter cold medicines. Cough suppressants. Coughing is a type of defense against infection that helps to clear the respiratory system, so take these medicines only as recommended by your health care provider. Fever-reducing medicines. Follow these instructions at home: Activity Rest as needed. If you have a fever, stay home from work or school until your fever is gone or until your health care provider says your URI cannot spread to other people (is no longer contagious). Your health care provider may have you wear a face mask to prevent your infection from  spreading. Relieving symptoms Gargle with a mixture of salt and water 3-4 times a day or as needed. To make salt water, completely dissolve -1 tsp (3-6 g) of salt in 1 cup (237 mL) of warm water. Use a cool-mist humidifier to add moisture to the air. This can help you breathe more easily. Eating and drinking  Drink enough fluid to keep your urine pale yellow. Eat soups and other clear broths. General instructions  Take over-the-counter and prescription medicines only as told by your health care provider. These include cold medicines, fever reducers, and cough suppressants. Do not use any products that contain nicotine or tobacco. These products include cigarettes, chewing tobacco, and vaping devices, such as e-cigarettes. If you need help quitting, ask your health care provider. Stay away from secondhand smoke. Stay up to date on all immunizations, including the yearly (annual) flu vaccine. Keep all follow-up visits. This is important. How to prevent the spread of infection to others URIs can be contagious. To prevent the infection from spreading: Wash your hands with soap and water for at least 20 seconds. If soap and water are not available, use hand sanitizer. Avoid touching your mouth, face, eyes, or nose. Cough or sneeze into a tissue or your sleeve or elbow instead of into your hand or into the air.  Contact a health care provider if: You are getting worse instead of better. You have a fever or chills. Your mucus is brown or red. You have yellow or brown discharge coming from your nose. You have pain in your face, especially when you bend forward. You have swollen neck glands. You have pain while swallowing. You have white areas in the back of your throat. Get help right away if: You have shortness of breath that gets worse. You have severe or persistent: Headache. Ear pain. Sinus pain. Chest pain. You have chronic lung disease along with any of the following: Making  high-pitched whistling sounds when you breathe, most often when you breathe out (wheezing). Prolonged cough (more than 14 days). Coughing up blood. A change in your usual mucus. You have a stiff neck. You have changes in your: Vision. Hearing. Thinking. Mood. These symptoms may be an emergency. Get help right away. Call 911. Do not wait to see if the symptoms will go away. Do not drive yourself to the hospital. Summary An upper respiratory infection (URI) is a common infection of the nose, throat, and upper air passages that lead to the lungs. A URI is caused by a virus. URIs usually get better on their own within 7-10 days. Medicines cannot cure URIs, but your health care provider may recommend certain medicines to help relieve symptoms. This information is not intended to replace advice given to you by your  health care provider. Make sure you discuss any questions you have with your health care provider. Document Revised: 04/28/2021 Document Reviewed: 04/28/2021 Elsevier Patient Education  [place].

## 2022-11-12 ENCOUNTER — Other Ambulatory Visit: Payer: Self-pay | Admitting: Family Medicine

## 2022-12-22 ENCOUNTER — Other Ambulatory Visit: Payer: Self-pay | Admitting: Family Medicine

## 2022-12-22 DIAGNOSIS — I10 Essential (primary) hypertension: Secondary | ICD-10-CM

## 2023-01-11 ENCOUNTER — Ambulatory Visit: Payer: BC Managed Care – PPO | Admitting: Family Medicine

## 2023-01-12 ENCOUNTER — Other Ambulatory Visit: Payer: Self-pay | Admitting: Family Medicine

## 2023-01-19 ENCOUNTER — Encounter: Payer: Self-pay | Admitting: Internal Medicine

## 2023-01-19 ENCOUNTER — Encounter: Payer: Self-pay | Admitting: Family Medicine

## 2023-01-19 ENCOUNTER — Ambulatory Visit: Payer: BC Managed Care – PPO | Admitting: Family Medicine

## 2023-01-19 VITALS — BP 124/60 | HR 65 | Temp 97.9°F | Ht 72.0 in | Wt 251.2 lb

## 2023-01-19 DIAGNOSIS — Z1211 Encounter for screening for malignant neoplasm of colon: Secondary | ICD-10-CM | POA: Diagnosis not present

## 2023-01-19 DIAGNOSIS — E119 Type 2 diabetes mellitus without complications: Secondary | ICD-10-CM | POA: Diagnosis not present

## 2023-01-19 DIAGNOSIS — Z23 Encounter for immunization: Secondary | ICD-10-CM

## 2023-01-19 DIAGNOSIS — I10 Essential (primary) hypertension: Secondary | ICD-10-CM | POA: Diagnosis not present

## 2023-01-19 LAB — CBC WITH DIFFERENTIAL/PLATELET
Basophils Absolute: 0.1 10*3/uL (ref 0.0–0.1)
Basophils Relative: 0.9 % (ref 0.0–3.0)
Eosinophils Absolute: 0.5 10*3/uL (ref 0.0–0.7)
Eosinophils Relative: 6 % — ABNORMAL HIGH (ref 0.0–5.0)
HCT: 45.6 % (ref 39.0–52.0)
Hemoglobin: 15.3 g/dL (ref 13.0–17.0)
Lymphocytes Relative: 19.6 % (ref 12.0–46.0)
Lymphs Abs: 1.7 10*3/uL (ref 0.7–4.0)
MCHC: 33.5 g/dL (ref 30.0–36.0)
MCV: 91.8 fl (ref 78.0–100.0)
Monocytes Absolute: 0.7 10*3/uL (ref 0.1–1.0)
Monocytes Relative: 7.7 % (ref 3.0–12.0)
Neutro Abs: 5.7 10*3/uL (ref 1.4–7.7)
Neutrophils Relative %: 65.8 % (ref 43.0–77.0)
Platelets: 367 10*3/uL (ref 150.0–400.0)
RBC: 4.96 Mil/uL (ref 4.22–5.81)
RDW: 13.9 % (ref 11.5–15.5)
WBC: 8.6 10*3/uL (ref 4.0–10.5)

## 2023-01-19 LAB — BASIC METABOLIC PANEL
BUN: 13 mg/dL (ref 6–23)
CO2: 26 mEq/L (ref 19–32)
Calcium: 9.3 mg/dL (ref 8.4–10.5)
Chloride: 103 mEq/L (ref 96–112)
Creatinine, Ser: 1.15 mg/dL (ref 0.40–1.50)
GFR: 72.12 mL/min (ref >60.00)
Glucose, Bld: 101 mg/dL — ABNORMAL HIGH (ref 70–99)
Potassium: 4 mEq/L (ref 3.5–5.1)
Sodium: 138 mEq/L (ref 135–145)

## 2023-01-19 LAB — LIPID PANEL
Cholesterol: 177 mg/dL (ref 0–200)
HDL: 56 mg/dL (ref >39.00)
LDL Cholesterol: 111 mg/dL — ABNORMAL HIGH (ref 0–99)
NonHDL: 120.7
Total CHOL/HDL Ratio: 3
Triglycerides: 51 mg/dL (ref 0.0–149.0)
VLDL: 10.2 mg/dL (ref 0.0–40.0)

## 2023-01-19 LAB — HEMOGLOBIN A1C: Hgb A1c MFr Bld: 5.7 % (ref 4.6–6.5)

## 2023-01-19 LAB — HEPATIC FUNCTION PANEL
ALT: 21 U/L (ref 0–53)
AST: 21 U/L (ref 0–37)
Albumin: 4.3 g/dL (ref 3.5–5.2)
Alkaline Phosphatase: 64 U/L (ref 39–117)
Bilirubin, Direct: 0.1 mg/dL (ref 0.0–0.3)
Total Bilirubin: 0.6 mg/dL (ref 0.2–1.2)
Total Protein: 7.3 g/dL (ref 6.0–8.3)

## 2023-01-19 LAB — TSH: TSH: 2.43 u[IU]/mL (ref 0.35–5.50)

## 2023-01-19 MED ORDER — TIRZEPATIDE 7.5 MG/0.5ML ~~LOC~~ SOAJ: 7.5000 mg | SUBCUTANEOUS | 1 refills | Status: DC

## 2023-01-19 NOTE — Assessment & Plan Note (Signed)
Chronic problem, on Losartan 50mg  daily w/ good control.  Currently asymptomatic.  Check labs due to ARB use but no anticipated med changes.  Will follow.

## 2023-01-19 NOTE — Assessment & Plan Note (Signed)
Relatively new problem for pt.  Currently on Mounjaro 5mg  weekly.  Reports his sugar cravings are returning and he would like to increase to 7.5mg  weekly.  New prescription sent to pharmacy.  UTD on foot exam, microalbumin.  Due for eye exam- pt to schedule.  Check labs.  Adjust meds prn

## 2023-01-19 NOTE — Progress Notes (Signed)
   Subjective:    Patient ID: [name] [name], male    DOB: 1966-06-30, 57 y.o.   MRN: [id number]  HPI DM- new problem for pt.  Currently on Moujaro 5mg  weekly.  Pt finds that he's having sugar cravings again.  Would like to increase to 7.5mg  dose.  Pt had to cancel eye exam- needs to reschedule.  No symptomatic lows.  Denies numbness/tingling of hands/feet.  HTN- chronic problem, currently on Losartan 50mg  daily.  BP is well controlled today.  No CP, SOB, HA's, visual changes, edema.  Obesity- pt is down 5 lbs since December.  Currently on Mounjaro   Review of Systems For ROS see HPI     Objective:   Physical Exam Vitals reviewed.  Constitutional:      General: He is not in acute distress.    Appearance: Normal appearance. He is well-developed. He is not ill-appearing.  HENT:     Head: Normocephalic and atraumatic.  Eyes:     Extraocular Movements: Extraocular movements intact.     Conjunctiva/sclera: Conjunctivae normal.     Pupils: Pupils are equal, round, and reactive to light.  Neck:     Thyroid: No thyromegaly.  Cardiovascular:     Rate and Rhythm: Normal rate and regular rhythm.     Pulses: Normal pulses.     Heart sounds: Normal heart sounds. No murmur heard. Pulmonary:     Effort: Pulmonary effort is normal. No respiratory distress.     Breath sounds: Normal breath sounds.  Abdominal:     General: Bowel sounds are normal. There is no distension.     Palpations: Abdomen is soft.  Musculoskeletal:     Cervical back: Normal range of motion and neck supple.     Right lower leg: No edema.     Left lower leg: No edema.  Lymphadenopathy:     Cervical: No cervical adenopathy.  Skin:    General: Skin is warm and dry.  Neurological:     General: No focal deficit present.     Mental Status: He is alert and oriented to person, place, and time.     Cranial Nerves: No cranial nerve deficit.  Psychiatric:        Mood and Affect: Mood normal.        Behavior: Behavior  normal.           Assessment & Plan:

## 2023-01-19 NOTE — Assessment & Plan Note (Signed)
Pt has lost 5 lbs and is now out of the morbid obesity category b/c his BMI has dropped below 35.  Applauded his efforts and encouraged him to continue.  Will follow.

## 2023-01-19 NOTE — Patient Instructions (Signed)
Follow up in 3-4 months to recheck sugar (we'll do the 2nd shingles shot at that time) [facility] notify you of your lab results and make any changes if needed Continue to work on healthy diet and regular exercise- you can do it! Schedule your eye exam and have them send me a copy of their report We'll call you to schedule your GI appt Call with any questions or concerns Stay Safe!  Stay Healthy! Happy Spring!!!

## 2023-01-20 ENCOUNTER — Telehealth: Payer: Self-pay

## 2023-01-20 NOTE — Telephone Encounter (Signed)
Pt aware of results 

## 2023-01-20 NOTE — Telephone Encounter (Signed)
-----   Message from Sheliah Hatch, MD sent at 01/20/2023 10:39 AM EDT ----- Labs look great!  No changes at this time

## 2023-02-12 ENCOUNTER — Other Ambulatory Visit: Payer: Self-pay | Admitting: Family Medicine

## 2023-02-13 NOTE — Telephone Encounter (Signed)
Left vm stating rx was sent in

## 2023-02-13 NOTE — Telephone Encounter (Signed)
Ambien 5 mg LOV: 01/19/23 Last Refill:09/26/22 Upcoming appt: 04/20/23

## 2023-02-15 ENCOUNTER — Ambulatory Visit (AMBULATORY_SURGERY_CENTER): Payer: BC Managed Care – PPO | Admitting: *Deleted

## 2023-02-15 VITALS — Ht 72.0 in | Wt 250.0 lb

## 2023-02-15 DIAGNOSIS — Z1211 Encounter for screening for malignant neoplasm of colon: Secondary | ICD-10-CM

## 2023-02-15 MED ORDER — NA SULFATE-K SULFATE-MG SULF 17.5-3.13-1.6 GM/177ML PO SOLN: 1.0000 | Freq: Once | ORAL | 0 refills | Status: AC

## 2023-02-15 NOTE — Progress Notes (Signed)

## 2023-02-17 ENCOUNTER — Other Ambulatory Visit: Payer: Self-pay

## 2023-02-17 DIAGNOSIS — E119 Type 2 diabetes mellitus without complications: Secondary | ICD-10-CM

## 2023-02-17 MED ORDER — TIRZEPATIDE 7.5 MG/0.5ML ~~LOC~~ SOAJ: 7.5000 mg | SUBCUTANEOUS | 1 refills | Status: DC

## 2023-02-28 ENCOUNTER — Encounter: Payer: Self-pay | Admitting: Internal Medicine

## 2023-03-15 ENCOUNTER — Encounter: Payer: BC Managed Care – PPO | Admitting: Internal Medicine

## 2023-04-20 ENCOUNTER — Ambulatory Visit: Payer: BC Managed Care – PPO | Admitting: Family Medicine

## 2023-04-27 ENCOUNTER — Ambulatory Visit: Payer: BC Managed Care – PPO | Admitting: Family Medicine

## 2023-04-27 ENCOUNTER — Encounter: Payer: Self-pay | Admitting: Family Medicine

## 2023-04-27 VITALS — BP 100/68 | HR 63 | Temp 98.7°F | Resp 17 | Ht 72.0 in | Wt 249.4 lb

## 2023-04-27 DIAGNOSIS — E119 Type 2 diabetes mellitus without complications: Secondary | ICD-10-CM

## 2023-04-27 DIAGNOSIS — Z7985 Long-term (current) use of injectable non-insulin antidiabetic drugs: Secondary | ICD-10-CM | POA: Diagnosis not present

## 2023-04-27 LAB — BASIC METABOLIC PANEL
BUN: 15 mg/dL (ref 6–23)
CO2: 27 mEq/L (ref 19–32)
Calcium: 9.5 mg/dL (ref 8.4–10.5)
Chloride: 105 mEq/L (ref 96–112)
Creatinine, Ser: 1.17 mg/dL (ref 0.40–1.50)
GFR: 70.51 mL/min (ref >60.00)
Glucose, Bld: 98 mg/dL (ref 70–99)
Potassium: 4.2 mEq/L (ref 3.5–5.1)
Sodium: 140 mEq/L (ref 135–145)

## 2023-04-27 LAB — HEMOGLOBIN A1C: Hgb A1c MFr Bld: 5.8 % (ref 4.6–6.5)

## 2023-04-27 NOTE — Assessment & Plan Note (Signed)
Ongoing issue for pt.  Hx of good control.  Last A1C 5.7% on Mounjaro 7.5mg  weekly.  UTD on foot exam and microalbumin.  Pt needs eye exam- encouraged to schedule.  Currently asymptomatic.  Check labs.  Adjust meds prn

## 2023-04-27 NOTE — Progress Notes (Signed)
   Subjective:    Patient ID: [name] [name], male    DOB: [birth year], 57 y.o.   MRN: [id number]  HPI DM- ongoing issue for pt.  Last A1C 5.7%  Due for eye exam.  UTD on microalbumin and foot exam.  Currently on Mounjaro 7.5mg  weekly and ARB for renal protection.  Pt drinking lots of fluids in this heat.  No CP, SOB, HA's, visual changes, edema.  No abd pain, N/V.  No numbness/tingling of hands/feet.   Review of Systems For ROS see HPI     Objective:   Physical Exam Vitals reviewed.  Constitutional:      General: He is not in acute distress.    Appearance: Normal appearance. He is well-developed. He is not ill-appearing.  HENT:     Head: Normocephalic and atraumatic.  Eyes:     Extraocular Movements: Extraocular movements intact.     Conjunctiva/sclera: Conjunctivae normal.     Pupils: Pupils are equal, round, and reactive to light.  Neck:     Thyroid: No thyromegaly.  Cardiovascular:     Rate and Rhythm: Normal rate and regular rhythm.     Pulses: Normal pulses.     Heart sounds: Normal heart sounds. No murmur heard. Pulmonary:     Effort: Pulmonary effort is normal. No respiratory distress.     Breath sounds: Normal breath sounds.  Abdominal:     General: Bowel sounds are normal. There is no distension.     Palpations: Abdomen is soft.  Musculoskeletal:     Cervical back: Normal range of motion and neck supple.     Right lower leg: No edema.     Left lower leg: No edema.  Lymphadenopathy:     Cervical: No cervical adenopathy.  Skin:    General: Skin is warm and dry.  Neurological:     General: No focal deficit present.     Mental Status: He is alert and oriented to person, place, and time.     Cranial Nerves: No cranial nerve deficit.  Psychiatric:        Mood and Affect: Mood normal.        Behavior: Behavior normal.           Assessment & Plan:

## 2023-04-27 NOTE — Patient Instructions (Signed)
Schedule your complete physical in 3-4 months We'll notify you of your lab results and make any changes if needed Schedule your eye exam and have them send me their results Call with any questions or concerns Stay Safe!  Stay Healthy! Have a great summer!!

## 2023-04-28 ENCOUNTER — Telehealth: Payer: Self-pay

## 2023-04-28 NOTE — Telephone Encounter (Signed)
Unable to reach pt no VM set up no answer and my chart is not active

## 2023-04-28 NOTE — Telephone Encounter (Signed)
-----   Message from Neena Rhymes sent at 04/28/2023  7:31 AM EDT ----- Labs look great!  No changes at this time

## 2023-05-09 DIAGNOSIS — R7303 Prediabetes: Secondary | ICD-10-CM | POA: Diagnosis not present

## 2023-05-09 LAB — HM DIABETES EYE EXAM

## 2023-06-22 ENCOUNTER — Other Ambulatory Visit: Payer: Self-pay | Admitting: Family Medicine

## 2023-06-22 DIAGNOSIS — I10 Essential (primary) hypertension: Secondary | ICD-10-CM

## 2023-07-27 ENCOUNTER — Other Ambulatory Visit (HOSPITAL_COMMUNITY): Payer: Self-pay

## 2023-08-01 ENCOUNTER — Other Ambulatory Visit: Payer: Self-pay | Admitting: Family Medicine

## 2023-08-01 NOTE — Telephone Encounter (Signed)
Last office visit 04/27/2023 Last refill 02/13/2023 30 w/ 3refills

## 2023-08-21 ENCOUNTER — Other Ambulatory Visit: Payer: Self-pay | Admitting: Family Medicine

## 2023-08-21 DIAGNOSIS — E119 Type 2 diabetes mellitus without complications: Secondary | ICD-10-CM

## 2023-09-28 ENCOUNTER — Telehealth: Payer: Self-pay

## 2023-09-28 NOTE — Telephone Encounter (Signed)
PA request has been Submitted. New Encounter created for follow up. For additional info see Pharmacy Prior Auth telephone encounter from 09/28/23.

## 2023-09-28 NOTE — Telephone Encounter (Signed)
Pharmacy Patient Advocate Encounter  Received notification from [facility] that Prior Authorization for  Mounjaro 7.5MG /0.5ML auto-injectors has been APPROVED from 09/28/23 to 09/27/24. Spoke to pharmacy to process.Copay is $35.00.    PA #/Case ID/Reference #: [id number]

## 2023-09-28 NOTE — Telephone Encounter (Signed)
Pharmacy Patient Advocate Encounter   Received notification from Pt Calls Messages that prior authorization for Mounjaro 7.5MG /0.5ML auto-injectors is required/requested.   Insurance verification completed.   The patient is insured through [facility] .   Per test claim: PA required; PA submitted to above mentioned insurance via CoverMyMeds Key/confirmation #/EOC [id number] Status is pending

## 2023-09-29 NOTE — Telephone Encounter (Signed)
Attempted a call to the patient no answer, and unable to LM

## 2023-09-29 NOTE — Telephone Encounter (Signed)
Noted . Pa has been approved .

## 2023-09-29 NOTE — Telephone Encounter (Signed)
Sent to PA Team.

## 2023-10-02 NOTE — Telephone Encounter (Signed)
Pt has been notified.

## 2023-10-02 NOTE — Telephone Encounter (Signed)
Scheduled pt appt at time of this call

## 2023-10-13 ENCOUNTER — Encounter: Payer: Self-pay | Admitting: Family Medicine

## 2023-10-13 ENCOUNTER — Ambulatory Visit (INDEPENDENT_AMBULATORY_CARE_PROVIDER_SITE_OTHER): Payer: 59 | Admitting: Family Medicine

## 2023-10-13 VITALS — BP 110/70 | HR 78 | Temp 98.0°F | Ht 72.0 in | Wt 244.0 lb

## 2023-10-13 DIAGNOSIS — E785 Hyperlipidemia, unspecified: Secondary | ICD-10-CM

## 2023-10-13 DIAGNOSIS — I10 Essential (primary) hypertension: Secondary | ICD-10-CM

## 2023-10-13 DIAGNOSIS — Z1211 Encounter for screening for malignant neoplasm of colon: Secondary | ICD-10-CM

## 2023-10-13 DIAGNOSIS — Z23 Encounter for immunization: Secondary | ICD-10-CM | POA: Diagnosis not present

## 2023-10-13 DIAGNOSIS — Z7985 Long-term (current) use of injectable non-insulin antidiabetic drugs: Secondary | ICD-10-CM | POA: Diagnosis not present

## 2023-10-13 DIAGNOSIS — E119 Type 2 diabetes mellitus without complications: Secondary | ICD-10-CM | POA: Diagnosis not present

## 2023-10-13 LAB — CBC WITH DIFFERENTIAL/PLATELET
Basophils Absolute: 0.1 10*3/uL (ref 0.0–0.1)
Basophils Relative: 0.7 % (ref 0.0–3.0)
Eosinophils Absolute: 0.4 10*3/uL (ref 0.0–0.7)
Eosinophils Relative: 4.1 % (ref 0.0–5.0)
HCT: 46 % (ref 39.0–52.0)
Hemoglobin: 15.4 g/dL (ref 13.0–17.0)
Lymphocytes Relative: 21.7 % (ref 12.0–46.0)
Lymphs Abs: 1.9 10*3/uL (ref 0.7–4.0)
MCHC: 33.6 g/dL (ref 30.0–36.0)
MCV: 93.3 fL (ref 78.0–100.0)
Monocytes Absolute: 0.8 10*3/uL (ref 0.1–1.0)
Monocytes Relative: 9.1 % (ref 3.0–12.0)
Neutro Abs: 5.6 10*3/uL (ref 1.4–7.7)
Neutrophils Relative %: 64.4 % (ref 43.0–77.0)
Platelets: 355 10*3/uL (ref 150.0–400.0)
RBC: 4.93 Mil/uL (ref 4.22–5.81)
RDW: 14.3 % (ref 11.5–15.5)
WBC: 8.8 10*3/uL (ref 4.0–10.5)

## 2023-10-13 LAB — LIPID PANEL
Cholesterol: 193 mg/dL (ref 0–200)
HDL: 56.4 mg/dL (ref >39.00)
LDL Cholesterol: 129 mg/dL — ABNORMAL HIGH (ref 0–99)
NonHDL: 136.95
Total CHOL/HDL Ratio: 3
Triglycerides: 40 mg/dL (ref 0.0–149.0)
VLDL: 8 mg/dL (ref 0.0–40.0)

## 2023-10-13 LAB — BASIC METABOLIC PANEL
BUN: 19 mg/dL (ref 6–23)
CO2: 23 meq/L (ref 19–32)
Calcium: 9.5 mg/dL (ref 8.4–10.5)
Chloride: 107 meq/L (ref 96–112)
Creatinine, Ser: 1.34 mg/dL (ref 0.40–1.50)
GFR: 59.73 mL/min — ABNORMAL LOW (ref >60.00)
Glucose, Bld: 99 mg/dL (ref 70–99)
Potassium: 4.5 meq/L (ref 3.5–5.1)
Sodium: 139 meq/L (ref 135–145)

## 2023-10-13 LAB — HEPATIC FUNCTION PANEL
ALT: 22 U/L (ref 0–53)
AST: 22 U/L (ref 0–37)
Albumin: 4.4 g/dL (ref 3.5–5.2)
Alkaline Phosphatase: 57 U/L (ref 39–117)
Bilirubin, Direct: 0.1 mg/dL (ref 0.0–0.3)
Total Bilirubin: 0.6 mg/dL (ref 0.2–1.2)
Total Protein: 7.6 g/dL (ref 6.0–8.3)

## 2023-10-13 LAB — TSH: TSH: 2.21 u[IU]/mL (ref 0.35–5.50)

## 2023-10-13 LAB — MICROALBUMIN / CREATININE URINE RATIO
Creatinine,U: 227.8 mg/dL
Microalb Creat Ratio: 0.3 mg/g (ref 0.0–30.0)
Microalb, Ur: <0.7 mg/dL (ref 0.0–1.9)

## 2023-10-13 LAB — HEMOGLOBIN A1C: Hgb A1c MFr Bld: 5.8 % (ref 4.6–6.5)

## 2023-10-13 MED ORDER — TIRZEPATIDE 10 MG/0.5ML ~~LOC~~ SOAJ: 10.0000 mg | SUBCUTANEOUS | 1 refills | Status: DC

## 2023-10-13 MED ORDER — COLCHICINE 0.6 MG PO TABS: 0.6000 mg | ORAL_TABLET | Freq: Two times a day (BID) | ORAL | 1 refills | Status: AC | PRN

## 2023-10-13 NOTE — Assessment & Plan Note (Signed)
 Chronic problem.  Attempting to control w/ diet and exercise.  Last LDL 111.  Goal is <70.  May need to start meds.

## 2023-10-13 NOTE — Assessment & Plan Note (Signed)
Chronic problem.  On Losartan 50mg  daily w/ good control.  Currently asymptomatic.  Check labs due to ARB use but no anticipated med changes.

## 2023-10-13 NOTE — Assessment & Plan Note (Signed)
 Chronic problem.  Currently on Mounjaro 7.5mg  weekly but cravings have returned.  Will increase to 10mg  weekly.  UTD on eye exam.  Foot exam done today, microalbumin ordered.  Currently asymptomatic.  Check labs.  Will continue to follow closely

## 2023-10-13 NOTE — Progress Notes (Signed)
   Subjective:    Patient ID: [name] [name], male    DOB: 1966-03-06, 58 y.o.   MRN: [id number]  HPI DM- chronic problem.  Currently on Mounjaro  7.5mg  weekly.  UTD on eye exam.  Due for foot exam, microalbumin.  Pt reports he is again having cravings.  Would like to increase dose if possible.  Denies abd pain, N/V.  No numbness/tingling of hands or feet.  Denies symptomatic lows.  HTN- chronic problem, on Losartan  50mg  daily w/ excellent control.  No CP, SOB, HA's, visual changes, edema.  Hyperlipidemia- chronic problem.  Last LDL 111.  Attempting to  control w/ diet and exercise.   Review of Systems For ROS see HPI     Objective:   Physical Exam Vitals reviewed.  Constitutional:      General: He is not in acute distress.    Appearance: Normal appearance. He is well-developed. He is not ill-appearing.  HENT:     Head: Normocephalic and atraumatic.  Eyes:     Extraocular Movements: Extraocular movements intact.     Conjunctiva/sclera: Conjunctivae normal.     Pupils: Pupils are equal, round, and reactive to light.  Neck:     Thyroid : No thyromegaly.  Cardiovascular:     Rate and Rhythm: Normal rate and regular rhythm.     Pulses: Normal pulses.     Heart sounds: Normal heart sounds. No murmur heard. Pulmonary:     Effort: Pulmonary effort is normal. No respiratory distress.     Breath sounds: Normal breath sounds.  Abdominal:     General: Bowel sounds are normal. There is no distension.     Palpations: Abdomen is soft.  Musculoskeletal:     Cervical back: Normal range of motion and neck supple.     Right lower leg: No edema.     Left lower leg: No edema.  Lymphadenopathy:     Cervical: No cervical adenopathy.  Skin:    General: Skin is warm and dry.  Neurological:     General: No focal deficit present.     Mental Status: He is alert and oriented to person, place, and time.     Cranial Nerves: No cranial nerve deficit.  Psychiatric:        Mood and Affect: Mood  normal.        Behavior: Behavior normal.           Assessment & Plan:

## 2023-10-13 NOTE — Patient Instructions (Signed)
 Schedule your complete physical in 3-4 months We'll notify you of your lab results and make any changes if needed Keep up the good work on healthy diet and regular exercise- you're doing great! INCREASE the Mounjaro  to 10mg  weekly We'll call you to schedule your GI appt Call with any questions or concerns Stay Safe!  Stay Healthy! Happy New Year!!

## 2023-10-16 ENCOUNTER — Telehealth: Payer: Self-pay

## 2023-10-16 ENCOUNTER — Other Ambulatory Visit: Payer: Self-pay

## 2023-10-16 DIAGNOSIS — E785 Hyperlipidemia, unspecified: Secondary | ICD-10-CM

## 2023-10-16 MED ORDER — ROSUVASTATIN CALCIUM 10 MG PO TABS: 10.0000 mg | ORAL_TABLET | Freq: Every evening | ORAL | 3 refills | Status: AC

## 2023-10-16 NOTE — Telephone Encounter (Signed)
-----   Message from Comer Greet sent at 10/16/2023  9:50 AM EST ----- Your LDL (bad cholesterol) has jumped 20 points up to 129.  Your goal as someone with diabetes is <70.  Based on this, we need to start Crestor  10mg  nightly (#30, 3 refills) while working on healthy diet and regular exercise.  We will also need to repeat a CMP at a lab only visit in 6 weeks to ensure you are metabolizing the medication appropriately (CMP, dx hyperlipidemia)  Remainder of labs are stable and look good

## 2023-10-16 NOTE — Telephone Encounter (Signed)
 Called pt unable to leave vm Crestor has been sent in  CMP ordered Lab visit will need to be made for 6 wk.

## 2023-12-01 ENCOUNTER — Encounter: Payer: Self-pay | Admitting: Family Medicine

## 2023-12-06 ENCOUNTER — Other Ambulatory Visit: Payer: Self-pay | Admitting: Family Medicine

## 2023-12-06 DIAGNOSIS — I10 Essential (primary) hypertension: Secondary | ICD-10-CM

## 2024-01-07 ENCOUNTER — Other Ambulatory Visit: Payer: Self-pay | Admitting: Family Medicine

## 2024-01-11 ENCOUNTER — Encounter: Payer: 59 | Admitting: Family Medicine

## 2024-03-25 ENCOUNTER — Telehealth: Payer: Self-pay | Admitting: Family Medicine

## 2024-03-25 DIAGNOSIS — I10 Essential (primary) hypertension: Secondary | ICD-10-CM

## 2024-03-25 NOTE — Telephone Encounter (Signed)
 Copied from CRM [id number]. Topic: Clinical - Medication Refill >> Mar 25, 2024  2:46 PM Aisha D wrote: Medication: losartan  (COZAAR ) 50 MG tablet, tirzepatide  (MOUNJARO ) 10 MG/0.5ML Pen  Has the patient contacted their pharmacy? Yes (Agent: If no, request that the patient contact the pharmacy for the refill. If patient does not wish to contact the pharmacy document the reason why and proceed with request.) (Agent: If yes, when and what did the pharmacy advise?)  This is the patient's preferred pharmacy:   [id number] Jonette Nestle,  - [address] AT [facility] [address] [place] [address] Phone: [phone] Fax: [phone]  Is this the correct pharmacy for this prescription? Yes If no, delete pharmacy and type the correct one.   Has the prescription been filled recently? No  Is the patient out of the medication? Yes  Has the patient been seen for an appointment in the last year OR does the patient have an upcoming appointment? Yes  Can we respond through MyChart? No  Agent: Please be advised that Rx refills may take up to 3 business days. We ask that you follow-up with your pharmacy.

## 2024-03-25 NOTE — Telephone Encounter (Signed)
 Copied from CRM [id number]. Topic: General - Other >> Mar 25, 2024  2:50 PM Aisha D wrote: Reason for CRM: Pt is wanting to schedule an appt to have blood work done. Pt would like to receive a callback regarding this request.

## 2024-03-26 MED ORDER — LOSARTAN POTASSIUM 50 MG PO TABS: 50.0000 mg | ORAL_TABLET | Freq: Every day | ORAL | 1 refills | Status: DC

## 2024-03-26 MED ORDER — TIRZEPATIDE 10 MG/0.5ML ~~LOC~~ SOAJ: 10.0000 mg | SUBCUTANEOUS | 0 refills | Status: DC

## 2024-03-26 NOTE — Telephone Encounter (Signed)
 Will call pharmacy they are currently on lunch

## 2024-03-26 NOTE — Addendum Note (Signed)
 Addended by: Deegan Valentino K on: 03/26/2024 08:43 AM   Modules accepted: Orders

## 2024-03-26 NOTE — Telephone Encounter (Unsigned)
 Copied from CRM [id number]. Topic: Clinical - Prescription Issue >> Mar 26, 2024  1:05 PM Chuck Crater wrote: Reason for CRM: Patient stated that [facility] on [address] is needing a diagnosis Script in order to filltirzepatide (MOUNJARO ) 10 MG/0.5ML Pen.

## 2024-03-26 NOTE — Telephone Encounter (Signed)
 Pt has been scheduled for a follow up on July 3rd to make up for missed appt.

## 2024-03-28 ENCOUNTER — Other Ambulatory Visit: Payer: Self-pay

## 2024-03-28 ENCOUNTER — Telehealth: Payer: Self-pay

## 2024-03-28 DIAGNOSIS — E785 Hyperlipidemia, unspecified: Secondary | ICD-10-CM

## 2024-03-28 MED ORDER — TIRZEPATIDE 10 MG/0.5ML ~~LOC~~ SOAJ: 10.0000 mg | SUBCUTANEOUS | 0 refills | Status: DC

## 2024-03-28 NOTE — Telephone Encounter (Signed)
 E11.9- Type 2 Diabetes, Controlled

## 2024-03-28 NOTE — Addendum Note (Signed)
 Addended by: Chevon Fomby K on: 03/28/2024 12:34 PM   Modules accepted: Orders

## 2024-03-28 NOTE — Telephone Encounter (Signed)
 Called pharmacy and got this completed

## 2024-03-28 NOTE — Telephone Encounter (Signed)
 Copied from CRM [id number]. Topic: Clinical - Prescription Issue >> Mar 28, 2024 10:17 AM Alyse July wrote: Reason for CRM: Prescription Diagnosis(Reason for patient being on tirzepatide  (MOUNJARO ) 10 MG/0.5ML Pen) is being request by the pharmacy in order for pharmacy to apply coupon for patient medication.   [id number] - [place],  - [address] AT [facility] [address]

## 2024-03-28 NOTE — Telephone Encounter (Signed)
 Should I inform them this is for weight loss or for other diagnosis code?

## 2024-04-05 ENCOUNTER — Telehealth: Payer: Self-pay

## 2024-04-05 ENCOUNTER — Ambulatory Visit (INDEPENDENT_AMBULATORY_CARE_PROVIDER_SITE_OTHER): Admitting: Student in an Organized Health Care Education/Training Program

## 2024-04-05 ENCOUNTER — Ambulatory Visit: Payer: Self-pay

## 2024-04-05 ENCOUNTER — Encounter: Payer: Self-pay | Admitting: Student in an Organized Health Care Education/Training Program

## 2024-04-05 VITALS — BP 125/87 | HR 63 | Wt 248.0 lb

## 2024-04-05 DIAGNOSIS — H1031 Unspecified acute conjunctivitis, right eye: Secondary | ICD-10-CM | POA: Diagnosis not present

## 2024-04-05 MED ORDER — MOXIFLOXACIN HCL 0.5 % OP SOLN: 1.0000 [drp] | Freq: Three times a day (TID) | OPHTHALMIC | 0 refills | Status: DC

## 2024-04-05 NOTE — Telephone Encounter (Signed)
 Called the pharmacy and informed them of the Dx Code for controlled Type 2 Diabetes, E11.9, they are going to submit this and let the patient know  Called and informed patient of this update

## 2024-04-05 NOTE — Telephone Encounter (Signed)
 FYI Only or Action Required?: Action required by provider: requesting meds without appt if possible, please call back.  Patient was last seen in primary care on 10/13/2023 by Mahlon Comer BRAVO, MD. Called Nurse Triage reporting Conjunctivitis, Eye Drainage, and Belepharitis. Symptoms began today. Interventions attempted: OTC medications: visine eye drops. Symptoms are: gradually worsening.  Triage Disposition: See Physician Within 24 Hours  Patient/caregiver understands and will follow disposition?: No, refuses disposition     Copied from CRM [id number]. Topic: Clinical - Red Word Triage >> Apr 05, 2024 12:13 PM Martinique E wrote: [facility] that prompted transfer to Nurse Triage: Potential pink eye. Patient's eyes are blood shot, and pus is oozing from right eye. Eyelids puffy. Symptoms started this morning. Reason for Disposition  Eyelid is red and painful (or tender to touch)  Answer Assessment - Initial Assessment Questions 1. EYE DISCHARGE: Is the discharge in one or both eyes? What color is it? How much is there? When did the discharge start?      Right eye, son had pink eye this past week, woke up this morning nose stopped up little bit, eyes are red, blood shot but didn't sleep great, eyes feel funny, eyelids look little puffy, white discharge coming from right eye, used visine 2. REDNESS OF SCLERA: Is the redness in one or both eyes? When did the redness start?      Both eyes 3. EYELIDS: Are the eyelids red or swollen? If Yes, ask: How much?      Feel like swelling, look a little red 4. VISION: Is there any difficulty seeing clearly?      Been putting eye drops in every 20 min, having to blink a lot to clear the discharge since blurry 5. PAIN: Is there any pain? If Yes, ask: How bad is it? (Scale 1-10; or mild, moderate, severe)    - MILD (1-3): doesn't interfere with normal activities     - MODERATE (4-7): interferes with normal activities or awakens from  sleep    - SEVERE (8-10): excruciating pain, unable to do any normal activities       No not really, eyes are burning a little bit, mild 6. CONTACT LENS: Do you wear contacts?     no 7. OTHER SYMPTOMS: Do you have any other symptoms? (e.g., fever, runny nose, cough)     Nasal congestion, no cough or fever  Protocols used: Eye - Pus or Discharge-A-AH

## 2024-04-05 NOTE — Assessment & Plan Note (Signed)
 Symptoms and exam are consistent with acute bacterial conjunctivitis of the right eye.  He has some mild eyelid swelling which may be from the conjunctivitis, I gave him some warnings about possible developing orbital cellulitis.  Will start ophthalmic moxifloxacin drops to be used 3 times a day for 7 days.  Gave him instructions about hygiene.  I recommended that he stop using Visine.  I recommended that he return to clinic, or go to an urgent care over the weekend if he has worsening swelling of the eyelid, or develops pain in the eye or decreased vision.

## 2024-04-05 NOTE — Progress Notes (Signed)
   Acute Office Visit  Subjective:     Patient ID: [name] [name], male    DOB: 1965-12-11, 58 y.o.   MRN: [id number]  Chief Complaint  Patient presents with   Eye Pain    Patients right eye is very red and irritating. Started this AM.  Patient states his son does have pink eye.    HPI  58 year old person here for evaluation of 1 day of redness and swelling of the right eye.  Earlier this week his son was diagnosed with bacterial conjunctivitis, treated with eyedrops and is doing very well.  Patient woke up this morning and had increasing discharge from his right eye.  On his drive to work he had some sensitivity to lights.  He reports some irritation in the eye but no pain with ocular movements.  Reports a very mild blurriness due to the discharge, no other changes in his vision.  No fevers or chills.  No other recent changes in his health.      Objective:    BP 125/87   Pulse 63   Wt 248 lb (112.5 kg)   SpO2 100%   BMI 33.63 kg/m   Physical Exam  Gen: Well-appearing man Eyes: Right eyelid has mild swelling, no tenderness to palpation, no erythema of the eyelid, there is a moderate mucopurulent discharge in the right eye, the right conjunctiva is injected and erythematous, pupil is reactive to light, extraocular motion is intact and painless      Assessment & Plan:   Problem List Items Addressed This Visit       Unprioritized   Acute bacterial conjunctivitis of right eye - Primary   Symptoms and exam are consistent with acute bacterial conjunctivitis of the right eye.  He has some mild eyelid swelling which may be from the conjunctivitis, I gave him some warnings about possible developing orbital cellulitis.  Will start ophthalmic moxifloxacin drops to be used 3 times a day for 7 days.  Gave him instructions about hygiene.  I recommended that he stop using Visine.  I recommended that he return to clinic, or go to an urgent care over the weekend if he has worsening  swelling of the eyelid, or develops pain in the eye or decreased vision.      Relevant Medications   moxifloxacin (VIGAMOX) 0.5 % ophthalmic solution    Meds ordered this encounter  Medications   moxifloxacin (VIGAMOX) 0.5 % ophthalmic solution    Sig: Place 1 drop into the right eye 3 (three) times daily for 7 days.    Dispense:  3 mL    Refill:  0    Return if symptoms worsen or fail to improve.  Cleatus Debby Specking, MD

## 2024-04-05 NOTE — Telephone Encounter (Signed)
 Patient is seeing you today at 4:00 pm

## 2024-04-05 NOTE — Telephone Encounter (Signed)
 Copied from CRM [id number]. Topic: Clinical - Medication Question >> Apr 05, 2024 12:17 PM [name] [name] wrote: Reason for CRM: Patient called in regarding his tirzepatide  (MOUNJARO ) 10 MG/0.5ML Pen. Patient has been trying to get this medication cheaper through his pharmacy and they will need a diagnosis and need for this medication. The pharmacy is the [facility] on [address]

## 2024-04-08 ENCOUNTER — Telehealth: Payer: Self-pay | Admitting: Family Medicine

## 2024-04-08 DIAGNOSIS — E119 Type 2 diabetes mellitus without complications: Secondary | ICD-10-CM

## 2024-04-08 DIAGNOSIS — H1031 Unspecified acute conjunctivitis, right eye: Secondary | ICD-10-CM

## 2024-04-08 MED ORDER — MOXIFLOXACIN HCL 0.5 % OP SOLN: 1.0000 [drp] | Freq: Three times a day (TID) | OPHTHALMIC | 0 refills | Status: AC

## 2024-04-08 MED ORDER — TIRZEPATIDE 10 MG/0.5ML ~~LOC~~ SOAJ: 10.0000 mg | SUBCUTANEOUS | 0 refills | Status: AC

## 2024-04-08 MED ORDER — AMOXICILLIN-POT CLAVULANATE 875-125 MG PO TABS: 1.0000 | ORAL_TABLET | Freq: Two times a day (BID) | ORAL | 0 refills | Status: AC

## 2024-04-08 NOTE — Telephone Encounter (Signed)
 Patient notes eye drops are not working, would you recommend new medication or other intervention? Also see index in chart I have also forwarded to you from this patients triage phone call.

## 2024-04-08 NOTE — Telephone Encounter (Signed)
 Caller name: [name] [name]  On DPR?: Yes  Call back number: [phone] (mobile)  Provider they see: Tabori, Katherine E, MD  Reason for call: Patient called overnight triage and stated the eye drops prescribed on Friday does not seem to be working and has requested an antibiotic or another form of treatment. Triage notes are in chart as well.

## 2024-04-08 NOTE — Addendum Note (Signed)
 Addended by: JERRELL SOLIAN T on: 04/08/2024 10:35 AM   Modules accepted: Orders

## 2024-04-08 NOTE — Telephone Encounter (Signed)
 I spoke with the patient by phone.  I was a little worried about the eyedrops not being enough given the mild eyelid swelling that he had.  He took a couple doses of Augmentin  from a family member over the weekend and today he is feeling better.  I will prescribe him his own prescription of Augmentin  to finish out another 3 days.  Will continue with the moxifloxacin drops as well.

## 2024-04-11 ENCOUNTER — Ambulatory Visit: Payer: Self-pay | Admitting: Family Medicine

## 2024-04-17 ENCOUNTER — Ambulatory Visit: Payer: Self-pay | Admitting: Family Medicine

## 2024-05-12 ENCOUNTER — Other Ambulatory Visit: Payer: Self-pay | Admitting: Family Medicine

## 2024-05-13 NOTE — Telephone Encounter (Signed)
 Requested Prescriptions   Pending Prescriptions Disp Refills   zolpidem  (AMBIEN ) 5 MG tablet [Pharmacy Med Name: ZOLPIDEM  5MG  TABLETS] 30 tablet     Sig: TAKE 1 TABLET(5 MG) BY MOUTH AT BEDTIME AS NEEDED FOR SLEEP     Date of patient request: 05/13/24 Last office visit: 10/13/2023 Upcoming visit: Visit date not found Date of last refill: 01/08/24 Last refill amount: 30 tablets with 3 refills

## 2024-09-16 ENCOUNTER — Telehealth: Payer: Self-pay

## 2024-09-16 DIAGNOSIS — I10 Essential (primary) hypertension: Secondary | ICD-10-CM

## 2024-09-16 MED ORDER — LOSARTAN POTASSIUM 50 MG PO TABS: 50.0000 mg | ORAL_TABLET | Freq: Every day | ORAL | 0 refills | Status: AC

## 2024-09-16 NOTE — Telephone Encounter (Signed)
 30 days sent as requested

## 2024-09-16 NOTE — Telephone Encounter (Signed)
 Please advise on refill.

## 2024-09-16 NOTE — Telephone Encounter (Signed)
 [place] to send 30 days. ?

## 2024-09-16 NOTE — Addendum Note (Signed)
 Addended by: Silvana Holecek K on: 09/16/2024 03:26 PM   Modules accepted: Orders

## 2024-09-20 ENCOUNTER — Encounter: Payer: Self-pay | Admitting: Family Medicine

## 2024-09-30 ENCOUNTER — Telehealth: Payer: Self-pay

## 2024-09-30 NOTE — Telephone Encounter (Signed)
 Patient came into the office and spoke to front desk saying he got a dismissal letter and would like to talk to someone about this, he told Cassie that he would not be in town on Dec 12th when she made the appointment and she said that is ok, if he doesn't make it, instead of making a different appointment, she said that another appointment can be made  Cassie is out of office and I cannot confirm this. I do see where patient got a dismissal letter.

## 2024-09-30 NOTE — Telephone Encounter (Signed)
 I cannot speak to what was said regarding the December appt, but I do know that he has missed other appts as well.  I will wait and try to get a better understanding as to what was said or what happened, bc it seems unusual that we would schedule someone for a date they are not available.
